# Patient Record
Sex: Female | Born: 1955
Health system: Southern US, Community
[De-identification: ages and names within clinical notes are randomized; demographics above are authoritative.]

## PROBLEM LIST (undated history)

## (undated) DIAGNOSIS — Z800 Family history of malignant neoplasm of digestive organs: Secondary | ICD-10-CM

## (undated) DIAGNOSIS — I10 Essential (primary) hypertension: Secondary | ICD-10-CM

## (undated) DIAGNOSIS — I739 Peripheral vascular disease, unspecified: Secondary | ICD-10-CM

## (undated) DIAGNOSIS — Z8049 Family history of malignant neoplasm of other genital organs: Secondary | ICD-10-CM

## (undated) DIAGNOSIS — J3089 Other allergic rhinitis: Secondary | ICD-10-CM

## (undated) DIAGNOSIS — J45909 Unspecified asthma, uncomplicated: Secondary | ICD-10-CM

## (undated) DIAGNOSIS — M1990 Unspecified osteoarthritis, unspecified site: Secondary | ICD-10-CM

## (undated) DIAGNOSIS — F419 Anxiety disorder, unspecified: Secondary | ICD-10-CM

## (undated) DIAGNOSIS — I471 Supraventricular tachycardia, unspecified: Secondary | ICD-10-CM

## (undated) DIAGNOSIS — C50919 Malignant neoplasm of unspecified site of unspecified female breast: Secondary | ICD-10-CM

## (undated) DIAGNOSIS — Z808 Family history of malignant neoplasm of other organs or systems: Secondary | ICD-10-CM

## (undated) DIAGNOSIS — M792 Neuralgia and neuritis, unspecified: Secondary | ICD-10-CM

## (undated) DIAGNOSIS — Z806 Family history of leukemia: Secondary | ICD-10-CM

## (undated) DIAGNOSIS — Z803 Family history of malignant neoplasm of breast: Secondary | ICD-10-CM

## (undated) DIAGNOSIS — Z8042 Family history of malignant neoplasm of prostate: Secondary | ICD-10-CM

## (undated) DIAGNOSIS — K219 Gastro-esophageal reflux disease without esophagitis: Secondary | ICD-10-CM

## (undated) HISTORY — DX: Malignant neoplasm of unspecified site of unspecified female breast: C50.919

## (undated) HISTORY — PX: BUNIONECTOMY: SHX129

## (undated) HISTORY — PX: COLONOSCOPY: SHX174

## (undated) HISTORY — DX: Family history of malignant neoplasm of other organs or systems: Z80.8

## (undated) HISTORY — PX: MYOMECTOMY: SHX85

## (undated) HISTORY — PX: WISDOM TOOTH EXTRACTION: SHX21

## (undated) HISTORY — PX: SVT ABLATION: EP1225

## (undated) HISTORY — DX: Family history of malignant neoplasm of breast: Z80.3

## (undated) HISTORY — DX: Family history of malignant neoplasm of other genital organs: Z80.49

## (undated) HISTORY — PX: CERVICAL FUSION: SHX112

## (undated) HISTORY — DX: Family history of malignant neoplasm of prostate: Z80.42

## (undated) HISTORY — PX: KNEE ARTHROSCOPY: SUR90

## (undated) HISTORY — PX: MOUTH SURGERY: SHX715

## (undated) HISTORY — DX: Family history of leukemia: Z80.6

## (undated) HISTORY — DX: Family history of malignant neoplasm of digestive organs: Z80.0

---

## 1999-07-18 ENCOUNTER — Other Ambulatory Visit: Admission: RE | Admit: 1999-07-18 | Discharge: 1999-07-18 | Payer: Self-pay | Admitting: Obstetrics and Gynecology

## 2000-08-14 ENCOUNTER — Other Ambulatory Visit: Admission: RE | Admit: 2000-08-14 | Discharge: 2000-08-14 | Payer: Self-pay | Admitting: Obstetrics and Gynecology

## 2001-09-10 ENCOUNTER — Other Ambulatory Visit: Admission: RE | Admit: 2001-09-10 | Discharge: 2001-09-10 | Payer: Self-pay | Admitting: Obstetrics and Gynecology

## 2002-09-23 ENCOUNTER — Other Ambulatory Visit: Admission: RE | Admit: 2002-09-23 | Discharge: 2002-09-23 | Payer: Self-pay | Admitting: Obstetrics and Gynecology

## 2003-10-06 ENCOUNTER — Other Ambulatory Visit: Admission: RE | Admit: 2003-10-06 | Discharge: 2003-10-06 | Payer: Self-pay | Admitting: Obstetrics and Gynecology

## 2004-10-11 ENCOUNTER — Other Ambulatory Visit: Admission: RE | Admit: 2004-10-11 | Discharge: 2004-10-11 | Payer: Self-pay | Admitting: Obstetrics and Gynecology

## 2013-11-07 ENCOUNTER — Encounter (HOSPITAL_BASED_OUTPATIENT_CLINIC_OR_DEPARTMENT_OTHER): Payer: Self-pay | Admitting: Emergency Medicine

## 2013-11-07 ENCOUNTER — Emergency Department (HOSPITAL_BASED_OUTPATIENT_CLINIC_OR_DEPARTMENT_OTHER)
Admission: EM | Admit: 2013-11-07 | Discharge: 2013-11-07 | Disposition: A | Discharge status: Home / Self Care | Payer: BC Managed Care – PPO | Attending: Emergency Medicine | Admitting: Emergency Medicine

## 2013-11-07 DIAGNOSIS — I10 Essential (primary) hypertension: Secondary | ICD-10-CM | POA: Insufficient documentation

## 2013-11-07 DIAGNOSIS — J3489 Other specified disorders of nose and nasal sinuses: Secondary | ICD-10-CM | POA: Diagnosis present

## 2013-11-07 DIAGNOSIS — J018 Other acute sinusitis: Secondary | ICD-10-CM | POA: Diagnosis not present

## 2013-11-07 DIAGNOSIS — IMO0002 Reserved for concepts with insufficient information to code with codable children: Secondary | ICD-10-CM | POA: Diagnosis not present

## 2013-11-07 HISTORY — DX: Essential (primary) hypertension: I10

## 2013-11-07 MED ORDER — AMOXICILLIN-POT CLAVULANATE 875-125 MG PO TABS: 1.0000 | ORAL_TABLET | Freq: Two times a day (BID) | ORAL | Status: DC

## 2013-11-07 MED ORDER — SALINE SPRAY 0.65 % NA SOLN: 1.0000 | NASAL | Status: DC | PRN

## 2013-11-07 MED ORDER — FLUTICASONE PROPIONATE 50 MCG/ACT NA SUSP: 2.0000 | Freq: Every day | NASAL | Status: DC

## 2013-11-07 NOTE — ED Provider Notes (Signed)
CSN: [id number]     Arrival date & time 11/07/13  1141 History   First MD Initiated Contact with Patient 11/07/13 1211     Chief Complaint  Patient presents with  . Sinusitis     (Consider location/radiation/quality/duration/timing/severity/associated sxs/prior Treatment) HPI  This a 58 year old female with history of hypertension who presents with nasal congestion, headache, and pressure behind her eyes. Patient reports onset of symptoms Monday. She started out with a sore throat which has gotten better. She now reports headache over her eyes and sinuses.  She reports nasal congestion. She has taken ibuprofen and Benadryl with little for leave. She's tried to see her primary physician but has been unsuccessful. She denies any fevers but does endorse hot flashes.  She denies any chest pain or shortness of breath. No known sick contacts.  Past Medical History  Diagnosis Date  . Hypertension    Past Surgical History  Procedure Laterality Date  . Cervical fusion     No family history on file. History  Substance Use Topics  . Smoking status: Never Smoker   . Smokeless tobacco: Not on file  . Alcohol Use: Yes     Comment: socially   OB History   Grav Para Term Preterm Abortions TAB SAB Ect Mult Living                 Review of Systems  Constitutional: Negative for fever.  HENT: Positive for congestion, sinus pressure and sore throat. Negative for facial swelling.   Respiratory: Negative for cough, chest tightness and shortness of breath.   Cardiovascular: Negative for chest pain.  Gastrointestinal: Negative for nausea, vomiting and abdominal pain.  Genitourinary: Negative for dysuria.  Skin: Negative for rash.  Neurological: Positive for headaches.  All other systems reviewed and are negative.     Allergies  Robaxin  Home Medications   Prior to Admission medications   Medication Sig Start Date End Date Taking? Authorizing Provider  amLODipine (NORVASC) 5 MG tablet  Take 5 mg by mouth daily.   Yes Historical Provider, MD  hydrochlorothiazide (HYDRODIURIL) 25 MG tablet Take 12.5 mg by mouth daily.   Yes Historical Provider, MD  amoxicillin-clavulanate (AUGMENTIN) 875-125 MG per tablet Take 1 tablet by mouth every 12 (twelve) hours. 11/07/13   Merryl Hacker, MD  fluticasone (FLONASE) 50 MCG/ACT nasal spray Place 2 sprays into both nostrils daily. 11/07/13   Merryl Hacker, MD  sodium chloride (OCEAN) 0.65 % SOLN nasal spray Place 1 spray into both nostrils as needed for congestion. 11/07/13   Merryl Hacker, MD   BP 122/82  Pulse 100  Temp(Src) 98.2 F (36.8 C) (Oral)  Resp 20  Ht 5\' 6"  (1.676 m)  Wt 178 lb (80.74 kg)  BMI 28.74 kg/m2  SpO2 100% Physical Exam  Nursing note and vitals reviewed. Constitutional: She is oriented to person, place, and time. She appears well-developed and well-nourished.  HENT:  Head: Normocephalic and atraumatic.  Right Ear: External ear normal.  Left Ear: External ear normal.  Nose: Nose normal.  Mouth/Throat: Oropharynx is clear and moist. No oropharyngeal exudate.  Tenderness to palpation over the frontal and maxillary sinuses  Eyes: Pupils are equal, round, and reactive to light.  Neck: Neck supple.  Cardiovascular: Normal rate, regular rhythm and normal heart sounds.   Pulmonary/Chest: Effort normal and breath sounds normal. No respiratory distress. She has no wheezes.  Abdominal: Soft. There is no tenderness.  Neurological: She is alert and oriented to person,  place, and time.  Skin: Skin is warm and dry.  Psychiatric: She has a normal mood and affect.    ED Course  Procedures (including critical care time) Labs Review Labs Reviewed - No data to display  Imaging Review No results found.   EKG Interpretation None      MDM   Final diagnoses:  Other acute sinusitis   Patient presents with sinus pressure, congestion, and headache. She is afebrile and nontoxic. She has had symptoms for less  than one week. She is not taking any nasal spray or steroids. Suspect early sinusitis. Discuss with patient supportive care with the addition of nasal saline and Flonase. I discussed the patient that at this time antibiotics are not indicated given that she is afebrile and has had such a short course of illness. Patient states that she's going out of town and is requesting antibiotics "in case it gets worse."  Again discussed the patient that and monitor not indicated at this time; however, will write patient an antibiotic prescription for her to hold for the next 3-5 days to see his symptoms began to improve with supportive care. Patient stated understanding.  After history, exam, and medical workup I feel the patient has been appropriately medically screened and is safe for discharge home. Pertinent diagnoses were discussed with the patient. Patient was given return precautions.     Merryl Hacker, MD 11/07/13 [phone]

## 2013-11-07 NOTE — ED Notes (Signed)
Pt reports congestion, pressure behind eyes x 4 days. Sts it's worse in the morning and lingers throughout the day.

## 2013-11-07 NOTE — Discharge Instructions (Signed)
Sinusitis Hold your antibiotic for 3-5 days to see if symptoms improve.  Sinusitis is redness, soreness, and swelling (inflammation) of the paranasal sinuses. Paranasal sinuses are air pockets within the bones of your face (beneath the eyes, the middle of the forehead, or above the eyes). In healthy paranasal sinuses, mucus is able to drain out, and air is able to circulate through them by way of your nose. However, when your paranasal sinuses are inflamed, mucus and air can become trapped. This can allow bacteria and other germs to grow and cause infection. Sinusitis can develop quickly and last only a short time (acute) or continue over a long period (chronic). Sinusitis that lasts for more than 12 weeks is considered chronic.  CAUSES  Causes of sinusitis include:  Allergies.  Structural abnormalities, such as displacement of the cartilage that separates your nostrils (deviated septum), which can decrease the air flow through your nose and sinuses and affect sinus drainage.  Functional abnormalities, such as when the small hairs (cilia) that line your sinuses and help remove mucus do not work properly or are not present. SYMPTOMS  Symptoms of acute and chronic sinusitis are the same. The primary symptoms are pain and pressure around the affected sinuses. Other symptoms include:  Upper toothache.  Earache.  Headache.  Bad breath.  Decreased sense of smell and taste.  A cough, which worsens when you are lying flat.  Fatigue.  Fever.  Thick drainage from your nose, which often is green and may contain pus (purulent).  Swelling and warmth over the affected sinuses. DIAGNOSIS  Your caregiver will perform a physical exam. During the exam, your caregiver may:  Look in your nose for signs of abnormal growths in your nostrils (nasal polyps).  Tap over the affected sinus to check for signs of infection.  View the inside of your sinuses (endoscopy) with a special imaging device with  a light attached (endoscope), which is inserted into your sinuses. If your caregiver suspects that you have chronic sinusitis, one or more of the following tests may be recommended:  Allergy tests.  Nasal culture--A sample of mucus is taken from your nose and sent to a lab and screened for bacteria.  Nasal cytology--A sample of mucus is taken from your nose and examined by your caregiver to determine if your sinusitis is related to an allergy. TREATMENT  Most cases of acute sinusitis are related to a viral infection and will resolve on their own within 10 days. Sometimes medicines are prescribed to help relieve symptoms (pain medicine, decongestants, nasal steroid sprays, or saline sprays).  However, for sinusitis related to a bacterial infection, your caregiver will prescribe antibiotic medicines. These are medicines that will help kill the bacteria causing the infection.  Rarely, sinusitis is caused by a fungal infection. In theses cases, your caregiver will prescribe antifungal medicine. For some cases of chronic sinusitis, surgery is needed. Generally, these are cases in which sinusitis recurs more than 3 times per year, despite other treatments. HOME CARE INSTRUCTIONS   Drink plenty of water. Water helps thin the mucus so your sinuses can drain more easily.  Use a humidifier.  Inhale steam 3 to 4 times a day (for example, sit in the bathroom with the shower running).  Apply a warm, moist washcloth to your face 3 to 4 times a day, or as directed by your caregiver.  Use saline nasal sprays to help moisten and clean your sinuses.  Take over-the-counter or prescription medicines for pain, discomfort, or  fever only as directed by your caregiver. SEEK IMMEDIATE MEDICAL CARE IF:  You have increasing pain or severe headaches.  You have nausea, vomiting, or drowsiness.  You have swelling around your face.  You have vision problems.  You have a stiff neck.  You have difficulty  breathing. MAKE SURE YOU:   Understand these instructions.  Will watch your condition.  Will get help right away if you are not doing well or get worse. Document Released: 04/17/2005 Document Revised: 07/10/2011 Document Reviewed: 05/02/2011 [facility] Patient Information 2015 [place], [place]. This information is not intended to replace advice given to you by your health care provider. Make sure you discuss any questions you have with your health care provider.

## 2015-04-09 ENCOUNTER — Ambulatory Visit (INDEPENDENT_AMBULATORY_CARE_PROVIDER_SITE_OTHER): Payer: BLUE CROSS/BLUE SHIELD | Admitting: Internal Medicine

## 2015-04-09 ENCOUNTER — Encounter: Payer: Self-pay | Admitting: Internal Medicine

## 2015-04-09 VITALS — BP 118/90 | HR 88 | Temp 98.2°F | Resp 16 | Ht 65.55 in | Wt 176.8 lb

## 2015-04-09 DIAGNOSIS — J301 Allergic rhinitis due to pollen: Secondary | ICD-10-CM

## 2015-04-09 DIAGNOSIS — J4520 Mild intermittent asthma, uncomplicated: Secondary | ICD-10-CM | POA: Diagnosis not present

## 2015-04-09 LAB — PULMONARY FUNCTION TEST

## 2015-04-09 MED ORDER — ALBUTEROL SULFATE HFA 108 (90 BASE) MCG/ACT IN AERS: 2.0000 | INHALATION_SPRAY | Freq: Four times a day (QID) | RESPIRATORY_TRACT | Status: DC | PRN

## 2015-04-09 NOTE — Assessment & Plan Note (Signed)
   Continue Proventil as needed

## 2015-04-09 NOTE — Progress Notes (Signed)
History of Present Illness: Rachael Osborn is a 59 y.o. female who is being seen today for follow-up  HPI Comments: Asthma: Since her last visit, she has had one or 2 episodes of bronchitis that was treated with prednisone and antibiotics by her primary care provider. Usual trigger is change in weather. She has not needed to go to the emergency room. She has not had pneumonia. She normally requires albuterol rarely.  Allergic rhinitis: Skin testing at an outside office was positive for dust, grass, weed, tree. She has not had any interval sinus infections but does report some nasal congestion. When she stops her daily loratadine, she does notice worsening of her symptoms. Occasionally, she will add Zyrtec but is concerned about rebound pruritus.   Assessment and Plan: Allergic rhinitis due to pollen  Continue loratadine 10 mg daily and as needed Allegra (fexofenadine)  Start Nasacort 2 sprays each nostril daily as needed  Mild intermittent asthma  Continue Proventil as needed    Return in about 1 year (around 04/08/2016).  Thank you for the opportunity to care for this patient.  Please do not hesitate to contact me with questions.  [facility] [address] [place], [place] [address] [phone]  Diagnostics: Spirometry: FEV1 110 %, FEV1/FVC  87%   Spirometry is in the normal range.   ROS: Per HPI unless specifically indicated below Review of Systems  Drug Allergies:  Allergies  Allergen Reactions  . Robaxin [Methocarbamol] Rash    Medications:  Current outpatient prescriptions:  .  albuterol (PROVENTIL HFA) 108 (90 BASE) MCG/ACT inhaler, Inhale 2 puffs into the lungs every 6 (six) hours as needed for wheezing or shortness of breath., Disp: 1 Inhaler, Rfl: 1 .  albuterol (PROVENTIL) (2.5 MG/3ML) 0.083% nebulizer solution, Take 2.5 mg by nebulization every 6 (six) hours as needed for wheezing or shortness of breath., Disp: , Rfl:  .   amLODipine (NORVASC) 5 MG tablet, Take 5 mg by mouth daily., Disp: , Rfl:  .  aspirin 81 MG tablet, Take 81 mg by mouth daily., Disp: , Rfl:  .  cetirizine (ZYRTEC) 10 MG tablet, prn, Disp: , Rfl: 6 .  DULoxetine HCl (CYMBALTA PO), Take by mouth., Disp: , Rfl:  .  estradiol (ESTRACE) 0.5 MG tablet, Take 0.5 mg by mouth daily., Disp: , Rfl:  .  Glucosamine HCl (GLUCOSAMINE PO), Take by mouth., Disp: , Rfl:  .  hydrochlorothiazide (HYDRODIURIL) 25 MG tablet, Take 12.5 mg by mouth daily., Disp: , Rfl:  .  HYDROcodone-homatropine (HYCODAN) 5-1.5 MG/5ML syrup, Take by mouth., Disp: , Rfl:  .  levonorgestrel (MIRENA, 52 MG,) 20 MCG/24HR IUD, 1 each by Intrauterine route., Disp: , Rfl:  .  loratadine (CLARITIN) 10 MG tablet, Take 10 mg by mouth daily., Disp: , Rfl:  .  multivitamin-iron-minerals-folic acid (CENTRUM) chewable tablet, Chew by mouth., Disp: , Rfl:  .  sodium chloride (OCEAN) 0.65 % SOLN nasal spray, Place 1 spray into both nostrils as needed for congestion., Disp: 30 mL, Rfl: 0 .  triamcinolone (NASACORT ALLERGY 24HR) 55 MCG/ACT AERO nasal inhaler, Place 2 sprays into the nose once as needed. , Disp: , Rfl:  .  amoxicillin-clavulanate (AUGMENTIN) 875-125 MG per tablet, Take 1 tablet by mouth every 12 (twelve) hours. (Patient not taking: Reported on 04/09/2015), Disp: 14 tablet, Rfl: 0  Physical Exam: BP 118/90 mmHg  Pulse 88  Temp(Src) 98.2 F (36.8 C) (Oral)  Resp 16  Ht 5' 5.55" (  1.665 m)  Wt 176 lb 12.9 oz (80.2 kg)  BMI 28.93 kg/m2  Physical Exam  Constitutional: She appears well-developed and well-nourished. No distress.  HENT:  Right Ear: External ear normal.  Left Ear: External ear normal.  Nose: Nose normal.  Mouth/Throat: Oropharynx is clear and moist.  Eyes: Conjunctivae are normal. Right eye exhibits no discharge. Left eye exhibits no discharge.  Cardiovascular: Normal rate, regular rhythm and normal heart sounds.   No murmur heard. Pulmonary/Chest: Effort normal  and breath sounds normal. No respiratory distress. She has no wheezes. She has no rales.  Abdominal: Soft. Bowel sounds are normal.  Musculoskeletal: She exhibits no edema.  Lymphadenopathy:    She has no cervical adenopathy.  Neurological: She is alert.  Skin: No rash noted.  Vitals reviewed.

## 2015-04-09 NOTE — Assessment & Plan Note (Signed)
   Continue loratadine 10 mg daily and as needed Allegra (fexofenadine)  Start Nasacort 2 sprays each nostril daily as needed

## 2015-04-09 NOTE — Patient Instructions (Signed)
Allergic rhinitis due to pollen  Continue loratadine 10 mg daily and as needed Allegra (fexofenadine)  Start Nasacort 2 sprays each nostril daily as needed  Mild intermittent asthma  Continue Proventil as needed

## 2015-04-13 ENCOUNTER — Other Ambulatory Visit: Payer: Self-pay | Admitting: Allergy

## 2015-04-13 NOTE — Telephone Encounter (Signed)
Got fax from [facility] for refill on amlodipine besylate 5mg . Faxed back we didn't prescribe.

## 2015-04-14 ENCOUNTER — Encounter: Payer: Self-pay | Admitting: Internal Medicine

## 2015-12-14 ENCOUNTER — Ambulatory Visit (INDEPENDENT_AMBULATORY_CARE_PROVIDER_SITE_OTHER): Payer: BLUE CROSS/BLUE SHIELD | Admitting: Pediatrics

## 2015-12-14 ENCOUNTER — Encounter: Payer: Self-pay | Admitting: Pediatrics

## 2015-12-14 VITALS — BP 126/68 | HR 88 | Temp 98.6°F | Resp 20 | Ht 65.35 in | Wt 179.0 lb

## 2015-12-14 DIAGNOSIS — J3089 Other allergic rhinitis: Secondary | ICD-10-CM

## 2015-12-14 DIAGNOSIS — I10 Essential (primary) hypertension: Secondary | ICD-10-CM | POA: Insufficient documentation

## 2015-12-14 DIAGNOSIS — L253 Unspecified contact dermatitis due to other chemical products: Secondary | ICD-10-CM | POA: Insufficient documentation

## 2015-12-14 DIAGNOSIS — J4530 Mild persistent asthma, uncomplicated: Secondary | ICD-10-CM | POA: Diagnosis not present

## 2015-12-14 MED ORDER — ALBUTEROL SULFATE HFA 108 (90 BASE) MCG/ACT IN AERS: 2.0000 | INHALATION_SPRAY | RESPIRATORY_TRACT | 1 refills | Status: DC | PRN

## 2015-12-14 MED ORDER — ALBUTEROL SULFATE (2.5 MG/3ML) 0.083% IN NEBU: 2.5000 mg | INHALATION_SOLUTION | RESPIRATORY_TRACT | 1 refills | Status: DC | PRN

## 2015-12-14 MED ORDER — MONTELUKAST SODIUM 10 MG PO TABS: 10.0000 mg | ORAL_TABLET | Freq: Every day | ORAL | 5 refills | Status: DC

## 2015-12-14 MED ORDER — TRIAMCINOLONE ACETONIDE 55 MCG/ACT NA AERO: INHALATION_SPRAY | NASAL | 5 refills | Status: DC

## 2015-12-14 MED ORDER — TRIAMCINOLONE ACETONIDE 0.1 % EX OINT: TOPICAL_OINTMENT | CUTANEOUS | 3 refills | Status: DC

## 2015-12-14 NOTE — Progress Notes (Signed)
[place] [id number] Dept: [phone]  FOLLOW UP NOTE  Patient ID: Rachael Osborn, female    DOB: Sep 02, 1955  Age: 60 y.o. MRN: [id number] Date of Office Visit: 12/14/2015  Assessment  Chief Complaint: Breathing Problem (more through her nasal passage. pt. had deviated septum repair back in the mid to late 90's); Nasal Congestion (sore throat); Rash (breaking out more around her neck at night.); and Cough (dry going on x's 1 week.)  HPI Rachael Osborn presents for follow-up of allergic rhinitis , asthma and a rash. Her asthma was well controlled until the past 2 weeks when she has been having more coughing. She used to be on allergy injections in the 1990s and was allergic to dust mites and some grass pollen During the past 2 weeks, she has had an itchy rash around her neck which improved with the use of hydrocortisone 1% over-the-counter.  Current medications Proventil 2 puffs every 4 hours if needed and loratadine 10 mg once a day. Her other medications are outlined in the chart.   Drug Allergies:  Allergies  Allergen Reactions  . Zyrtec [Cetirizine] Itching  . Robaxin [Methocarbamol] Rash    Physical Exam: BP 126/68 (BP Location: Right Arm, Patient Position: Sitting, Cuff Size: Normal)   Pulse 88   Temp 98.6 F (37 C) (Oral)   Resp 20   Ht 5' 5.35" (1.66 m)   Wt 179 lb 0.2 oz (81.2 kg)   BMI 29.47 kg/m    Physical Exam  Constitutional: She is oriented to person, place, and time. She appears well-developed and well-nourished.  HENT:  Eyes normal. Ears normal. Nose moderate swelling of nasal turbinates with clear nasal discharge. Pharynx normal.  Neck: Neck supple.  Cardiovascular:  S1 and S2 normal no murmurs  Pulmonary/Chest:  Clear to percussion and auscultation  Lymphadenopathy:    She has no cervical adenopathy.  Neurological: She is alert and oriented to person, place, and time.  Psychiatric:  Clear  Vitals reviewed.   Diagnostics:  FVC 2.86 L FEV1 2.47 L. Predicted FVC 2.74 L predicted FEV1 2.15 L-the spirometry is in the normal range  Assessment and Plan: 1. Mild persistent asthma, uncomplicated   2. Other allergic rhinitis   3. Contact dermatitis due to chemicals   4. Essential hypertension     Meds ordered this encounter  Medications  . montelukast (SINGULAIR) 10 MG tablet    Sig: Take 1 tablet (10 mg total) by mouth at bedtime.    Dispense:  30 tablet    Refill:  5    For cough or wheeze.  [name] albuterol (PROVENTIL HFA) 108 (90 Base) MCG/ACT inhaler    Sig: Inhale 2 puffs into the lungs every 4 (four) hours as needed for wheezing or shortness of breath.    Dispense:  1 Inhaler    Refill:  1  . albuterol (PROVENTIL) (2.5 MG/3ML) 0.083% nebulizer solution    Sig: Take 3 mLs (2.5 mg total) by nebulization every 4 (four) hours as needed for wheezing or shortness of breath.    Dispense:  75 mL    Refill:  1  . triamcinolone ointment (KENALOG) 0.1 %    Sig: Apply twice daily to red itchy areas    Dispense:  45 g    Refill:  3    Below face  . triamcinolone (NASACORT ALLERGY 24HR) 55 MCG/ACT AERO nasal inhaler    Sig: TWO SPRAYS EACH NOSTRIL ONCE A DAY FOR NASAL CONGESTION  OR DRAINAGE.    Dispense:  16.9 mL    Refill:  5    Patient Instructions  Loratadine 10 mg once a day for runny nose or itchy eyes Nasacort AQ 2 sprays per nostril once a day for stuffy nose Montelukast  10 mg once a day for coughing or wheezing Proventil 2 puffs every 4 hours if needed for wheezing or coughing spells or instead albuterol 0.083% one unit dose every 4 hours if needed Triamcinolone 0.1% ointment twice a day if needed to red itchy areas below the face Call me if you are not doing well on this treatment plan   Return in about 1 year (around 12/13/2016).    Thank you for the opportunity to care for this patient.  Please do not hesitate to contact me with questions.  Penne Lash, M.D.  [facility] [address] [place], [place] [address] [phone]

## 2015-12-14 NOTE — Patient Instructions (Addendum)
Loratadine 10 mg once a day for runny nose or itchy eyes Nasacort AQ 2 sprays per nostril once a day for stuffy nose Montelukast  10 mg once a day for coughing or wheezing Proventil 2 puffs every 4 hours if needed for wheezing or coughing spells or instead albuterol 0.083% one unit dose every 4 hours if needed Triamcinolone 0.1% ointment twice a day if needed to red itchy areas below the face Call me if you are not doing well on this treatment plan

## 2016-04-28 ENCOUNTER — Telehealth: Payer: Self-pay | Admitting: *Deleted

## 2016-04-28 NOTE — Telephone Encounter (Signed)
Pt is wondering if we can refile to the insurance companys.

## 2016-05-02 NOTE — Telephone Encounter (Signed)
Adjusted due to [facility] requirement

## 2016-05-08 ENCOUNTER — Other Ambulatory Visit: Payer: Self-pay | Admitting: Obstetrics and Gynecology

## 2016-05-08 NOTE — Patient Instructions (Signed)
Your procedure is scheduled on:  Monday, Jan. 15, 2018  Enter through the [employer] [facility] at:  11:15 AM  Pick up the phone at the desk and dial [phone].  Call this number if you have problems the morning of surgery: [phone].  Remember: Do NOT eat food:  After Midnight Sunday  Do NOT drink clear liquids after:  8:30 AM day of surgery  Take these medicines the morning of surgery with a SIP OF WATER:  Amlodipine, Duloxetine, Hydrochlorothiazide  Bring Asthma Inhaler day of surgery  Stop ALL herbal medications and turmeric at this time  Do NOT smoke the day of surgery.  Do NOT wear jewelry (body piercing), metal hair clips/bobby pins, make-up, or nail polish. Do NOT wear lotions, powders, or perfumes.  You may wear deodorant. Do NOT shave for 48 hours prior to surgery. Do NOT bring valuables to the hospital. Contacts, dentures, or bridgework may not be worn into surgery.  Have a responsible adult drive you home and stay with you for 24 hours after your procedure  Bring a copy of your healthcare power of attorney and living will documents.

## 2016-05-09 ENCOUNTER — Encounter (HOSPITAL_COMMUNITY)
Admission: RE | Admit: 2016-05-09 | Discharge: 2016-05-09 | Disposition: A | Discharge status: Home / Self Care | Payer: BLUE CROSS/BLUE SHIELD | Source: Ambulatory Visit | Attending: Obstetrics and Gynecology | Admitting: Obstetrics and Gynecology

## 2016-05-09 ENCOUNTER — Encounter (HOSPITAL_COMMUNITY): Payer: Self-pay

## 2016-05-09 DIAGNOSIS — Z01810 Encounter for preprocedural cardiovascular examination: Secondary | ICD-10-CM | POA: Diagnosis present

## 2016-05-09 DIAGNOSIS — Z01812 Encounter for preprocedural laboratory examination: Secondary | ICD-10-CM | POA: Diagnosis present

## 2016-05-09 HISTORY — DX: Other allergic rhinitis: J30.89

## 2016-05-09 HISTORY — DX: Unspecified osteoarthritis, unspecified site: M19.90

## 2016-05-09 HISTORY — DX: Neuralgia and neuritis, unspecified: M79.2

## 2016-05-09 HISTORY — DX: Unspecified asthma, uncomplicated: J45.909

## 2016-05-09 LAB — BASIC METABOLIC PANEL
Anion gap: 6 (ref 5–15)
BUN: 15 mg/dL (ref 6–20)
CHLORIDE: 103 mmol/L (ref 101–111)
CO2: 30 mmol/L (ref 22–32)
CREATININE: 0.67 mg/dL (ref 0.44–1.00)
Calcium: 9.4 mg/dL (ref 8.9–10.3)
GFR calc Af Amer: >60 mL/min (ref >60)
GFR calc non Af Amer: >60 mL/min (ref >60)
GLUCOSE: 86 mg/dL (ref 65–99)
POTASSIUM: 3.8 mmol/L (ref 3.5–5.1)
SODIUM: 139 mmol/L (ref 135–145)

## 2016-05-09 LAB — CBC
HEMATOCRIT: 37.9 % (ref 36.0–46.0)
HEMOGLOBIN: 13 g/dL (ref 12.0–15.0)
MCH: 29.6 pg (ref 26.0–34.0)
MCHC: 34.3 g/dL (ref 30.0–36.0)
MCV: 86.3 fL (ref 78.0–100.0)
Platelets: 263 10*3/uL (ref 150–400)
RBC: 4.39 MIL/uL (ref 3.87–5.11)
RDW: 13.9 % (ref 11.5–15.5)
WBC: 5.3 10*3/uL (ref 4.0–10.5)

## 2016-05-09 NOTE — Pre-Procedure Instructions (Signed)
Dr. Hatchett reviewed EKG no new orders received at this time. 

## 2016-05-15 ENCOUNTER — Ambulatory Visit (HOSPITAL_COMMUNITY): Payer: BLUE CROSS/BLUE SHIELD | Admitting: Anesthesiology

## 2016-05-15 ENCOUNTER — Encounter (HOSPITAL_COMMUNITY): Payer: Self-pay | Admitting: *Deleted

## 2016-05-15 ENCOUNTER — Encounter (HOSPITAL_COMMUNITY)
Admission: RE | Disposition: A | Discharge status: Home / Self Care | Payer: Self-pay | Source: Ambulatory Visit | Attending: Obstetrics and Gynecology

## 2016-05-15 ENCOUNTER — Ambulatory Visit (HOSPITAL_COMMUNITY)
Admission: RE | Admit: 2016-05-15 | Discharge: 2016-05-15 | Disposition: A | Discharge status: Home / Self Care | Payer: BLUE CROSS/BLUE SHIELD | Source: Ambulatory Visit | Attending: Obstetrics and Gynecology | Admitting: Obstetrics and Gynecology

## 2016-05-15 DIAGNOSIS — Z4589 Encounter for adjustment and management of other implanted devices: Secondary | ICD-10-CM | POA: Diagnosis present

## 2016-05-15 DIAGNOSIS — Z7982 Long term (current) use of aspirin: Secondary | ICD-10-CM | POA: Insufficient documentation

## 2016-05-15 DIAGNOSIS — M1990 Unspecified osteoarthritis, unspecified site: Secondary | ICD-10-CM | POA: Diagnosis not present

## 2016-05-15 DIAGNOSIS — I10 Essential (primary) hypertension: Secondary | ICD-10-CM | POA: Diagnosis not present

## 2016-05-15 DIAGNOSIS — Z79899 Other long term (current) drug therapy: Secondary | ICD-10-CM | POA: Insufficient documentation

## 2016-05-15 HISTORY — PX: HYSTEROSCOPY: SHX211

## 2016-05-15 SURGERY — EXAM UNDER ANESTHESIA
Anesthesia: General

## 2016-05-15 MED ORDER — MIDAZOLAM HCL 2 MG/2ML IJ SOLN
INTRAMUSCULAR | Status: AC
  Filled 2016-05-15: qty 2

## 2016-05-15 MED ORDER — MIDAZOLAM HCL 5 MG/5ML IJ SOLN
INTRAMUSCULAR | Status: DC | PRN
  Administered 2016-05-15: 2 mg via INTRAVENOUS

## 2016-05-15 MED ORDER — LIDOCAINE HCL 1 % IJ SOLN
INTRAMUSCULAR | Status: AC
  Filled 2016-05-15: qty 20

## 2016-05-15 MED ORDER — DEXAMETHASONE SODIUM PHOSPHATE 4 MG/ML IJ SOLN
INTRAMUSCULAR | Status: AC
  Filled 2016-05-15: qty 1

## 2016-05-15 MED ORDER — SCOPOLAMINE 1 MG/3DAYS TD PT72
MEDICATED_PATCH | TRANSDERMAL | Status: AC
  Administered 2016-05-15: 1.5 mg
  Filled 2016-05-15: qty 1

## 2016-05-15 MED ORDER — LACTATED RINGERS IV SOLN: INTRAVENOUS | Status: DC

## 2016-05-15 MED ORDER — ONDANSETRON HCL 4 MG/2ML IJ SOLN
INTRAMUSCULAR | Status: AC
  Filled 2016-05-15: qty 2

## 2016-05-15 MED ORDER — LIDOCAINE HCL 1 % IJ SOLN
INTRAMUSCULAR | Status: DC | PRN
  Administered 2016-05-15: 10 mL

## 2016-05-15 MED ORDER — KETOROLAC TROMETHAMINE 30 MG/ML IJ SOLN
INTRAMUSCULAR | Status: DC | PRN
Start: 2016-05-15 — End: 2016-05-15
  Administered 2016-05-15: 30 mg via INTRAVENOUS

## 2016-05-15 MED ORDER — FENTANYL CITRATE (PF) 100 MCG/2ML IJ SOLN
INTRAMUSCULAR | Status: DC | PRN
  Administered 2016-05-15: 50 ug via INTRAVENOUS

## 2016-05-15 MED ORDER — DEXAMETHASONE SODIUM PHOSPHATE 10 MG/ML IJ SOLN
INTRAMUSCULAR | Status: DC | PRN
  Administered 2016-05-15: 4 mg via INTRAVENOUS

## 2016-05-15 MED ORDER — LIDOCAINE HCL (CARDIAC) 20 MG/ML IV SOLN
INTRAVENOUS | Status: DC | PRN
  Administered 2016-05-15: 100 mg via INTRAVENOUS

## 2016-05-15 MED ORDER — SUCCINYLCHOLINE CHLORIDE 200 MG/10ML IV SOSY
PREFILLED_SYRINGE | INTRAVENOUS | Status: AC
  Filled 2016-05-15: qty 10

## 2016-05-15 MED ORDER — CEFAZOLIN SODIUM-DEXTROSE 2-4 GM/100ML-% IV SOLN
INTRAVENOUS | Status: AC
  Filled 2016-05-15: qty 100

## 2016-05-15 MED ORDER — LIDOCAINE HCL (CARDIAC) 20 MG/ML IV SOLN
INTRAVENOUS | Status: AC
  Filled 2016-05-15: qty 5

## 2016-05-15 MED ORDER — FENTANYL CITRATE (PF) 100 MCG/2ML IJ SOLN
INTRAMUSCULAR | Status: AC
  Filled 2016-05-15: qty 2

## 2016-05-15 MED ORDER — CEFAZOLIN SODIUM-DEXTROSE 2-3 GM-% IV SOLR
INTRAVENOUS | Status: DC | PRN
  Administered 2016-05-15: 2 g via INTRAVENOUS

## 2016-05-15 MED ORDER — PROPOFOL 10 MG/ML IV BOLUS
INTRAVENOUS | Status: DC | PRN
  Administered 2016-05-15: 300 mg via INTRAVENOUS

## 2016-05-15 MED ORDER — ONDANSETRON HCL 4 MG/2ML IJ SOLN
INTRAMUSCULAR | Status: DC | PRN
  Administered 2016-05-15: 4 mg via INTRAVENOUS

## 2016-05-15 MED ORDER — PROPOFOL 10 MG/ML IV BOLUS
INTRAVENOUS | Status: AC
  Filled 2016-05-15: qty 40

## 2016-05-15 MED ORDER — LACTATED RINGERS IV SOLN
INTRAVENOUS | Status: DC
  Administered 2016-05-15: 12:00:00 via INTRAVENOUS

## 2016-05-15 SURGICAL SUPPLY — 19 items
BIPOLAR CUTTING LOOP 21FR (ELECTRODE)
CANISTER SUCT 3000ML (MISCELLANEOUS) ×3 IMPLANT
CATH ROBINSON RED A/P 16FR (CATHETERS) ×3 IMPLANT
CLOTH BEACON ORANGE TIMEOUT ST (SAFETY) ×3 IMPLANT
CONTAINER PREFILL 10% NBF 60ML (FORM) ×6 IMPLANT
COVER BACK TABLE 60X90IN (DRAPES) ×3 IMPLANT
ELECT REM PT RETURN 9FT ADLT (ELECTROSURGICAL) ×3
ELECTRODE REM PT RTRN 9FT ADLT (ELECTROSURGICAL) ×1 IMPLANT
GLOVE BIOGEL PI IND STRL 7.0 (GLOVE) ×1 IMPLANT
GLOVE BIOGEL PI INDICATOR 7.0 (GLOVE) ×2
GLOVE ECLIPSE 7.0 STRL STRAW (GLOVE) ×6 IMPLANT
GOWN STRL REUS W/TWL LRG LVL3 (GOWN DISPOSABLE) ×9 IMPLANT
LOOP CUTTING BIPOLAR 21FR (ELECTRODE) IMPLANT
PACK VAGINAL MINOR WOMEN LF (CUSTOM PROCEDURE TRAY) ×3 IMPLANT
PAD OB MATERNITY 4.3X12.25 (PERSONAL CARE ITEMS) ×3 IMPLANT
TOWEL OR 17X24 6PK STRL BLUE (TOWEL DISPOSABLE) ×6 IMPLANT
TUBING AQUILEX INFLOW (TUBING) ×3 IMPLANT
TUBING AQUILEX OUTFLOW (TUBING) ×3 IMPLANT
WATER STERILE IRR 1000ML POUR (IV SOLUTION) ×3 IMPLANT

## 2016-05-15 NOTE — Transfer of Care (Signed)
Immediate Anesthesia Transfer of Care Note  Patient: Rachael Osborn  Procedure(s) Performed: Procedure(s): EXAM UNDER ANESTHESIA (N/A) HYSTEROSCOPY (N/A)  Patient Location: PACU  Anesthesia Type:General  Level of Consciousness: awake, alert  and oriented  Airway & Oxygen Therapy: Patient Spontanous Breathing and Patient connected to nasal cannula oxygen  Post-op Assessment: Report given to RN and Post -op Vital signs reviewed and stable  Post vital signs: Reviewed and stable  Last Vitals:  Vitals:   05/15/16 1127 05/15/16 1326  BP: 130/90 125/86  Pulse: 90 (!) 103  Resp: 20 16  Temp: 36.7 C 36.9 C    Last Pain:  Vitals:   05/15/16 1127  TempSrc: Oral      Patients Stated Pain Goal: 3 ([id number])  Complications: No apparent anesthesia complications

## 2016-05-15 NOTE — Anesthesia Preprocedure Evaluation (Signed)
Anesthesia Evaluation  Patient identified by MRN, date of birth, ID band Patient awake    Reviewed: Allergy & Precautions, NPO status , Patient's Chart, lab work & pertinent test results  Airway Mallampati: II  TM Distance: >3 FB Neck ROM: Full    Dental  (+) Teeth Intact, Dental Advisory Given   Pulmonary asthma ,    Pulmonary exam normal breath sounds clear to auscultation       Cardiovascular hypertension, Pt. on medications Normal cardiovascular exam Rhythm:Regular Rate:Normal     Neuro/Psych negative neurological ROS  negative psych ROS   GI/Hepatic negative GI ROS, Neg liver ROS,   Endo/Other  negative endocrine ROS  Renal/GU negative Renal ROS     Musculoskeletal  (+) Arthritis , Osteoarthritis,    Abdominal   Peds  Hematology negative hematology ROS (+)   Anesthesia Other Findings Day of surgery medications reviewed with the patient.  Reproductive/Obstetrics                             Anesthesia Physical Anesthesia Plan  ASA: II  Anesthesia Plan: General   Post-op Pain Management:    Induction: Intravenous  Airway Management Planned: Oral ETT  Additional Equipment:   Intra-op Plan:   Post-operative Plan: Extubation in OR  Informed Consent: I have reviewed the patients History and Physical, chart, labs and discussed the procedure including the risks, benefits and alternatives for the proposed anesthesia with the patient or authorized representative who has indicated his/her understanding and acceptance.   Dental advisory given  Plan Discussed with: CRNA  Anesthesia Plan Comments: (Risks/benefits of general anesthesia discussed with patient including risk of damage to teeth, lips, gum, and tongue, nausea/vomiting, allergic reactions to medications, and the possibility of heart attack, stroke and death.  All patient questions answered.  Patient wishes to proceed.)         Anesthesia Quick Evaluation

## 2016-05-15 NOTE — Anesthesia Postprocedure Evaluation (Signed)
Anesthesia Post Note  Patient: KESHAUN NEWNHAM  Procedure(s) Performed: Procedure(s) (LRB): EXAM UNDER ANESTHESIA (N/A) HYSTEROSCOPY (N/A)  Patient location during evaluation: PACU Anesthesia Type: General Level of consciousness: awake and alert Pain management: pain level controlled Vital Signs Assessment: post-procedure vital signs reviewed and stable Respiratory status: spontaneous breathing, nonlabored ventilation, respiratory function stable and patient connected to nasal cannula oxygen Cardiovascular status: blood pressure returned to baseline and stable Postop Assessment: no signs of nausea or vomiting Anesthetic complications: no        Last Vitals:  Vitals:   05/15/16 1415 05/15/16 1430  BP: 118/79 116/80  Pulse: 86 93  Resp: 15 17  Temp:  36.4 C    Last Pain:  Vitals:   05/15/16 1440  TempSrc:   PainSc: 0-No pain   Pain Goal: Patients Stated Pain Goal: 3 (05/15/16 1430)               Catalina Gravel
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
## 2016-05-15 NOTE — Anesthesia Procedure Notes (Signed)
Procedure Name: Intubation Date/Time: 05/15/2016 12:33 PM Performed by: Johnathan Tortorelli, Sheron Nightingale Pre-anesthesia Checklist: Patient identified, Timeout performed, Emergency Drugs available, Suction available and Patient being monitored Patient Re-evaluated:Patient Re-evaluated prior to inductionOxygen Delivery Method: Circle system utilized Preoxygenation: Pre-oxygenation with 100% oxygen Intubation Type: IV induction Laryngoscope Size: Mac and 3 Grade View: Grade II Tube type: Oral Tube size: 7.0 mm Number of attempts: 1 Placement Confirmation: ETT inserted through vocal cords under direct vision,  breath sounds checked- equal and bilateral and positive ETCO2 Secured at: 21 cm Dental Injury: Teeth and Oropharynx as per pre-operative assessment

## 2016-05-15 NOTE — Discharge Instructions (Signed)
DISCHARGE INSTRUCTIONS: HYSTEROSCOPY / ENDOMETRIAL ABLATION The following instructions have been prepared to help you care for yourself upon your return home.  May Remove Scop patch on or before  May take Ibuprofen after  May take stool softner while taking narcotic pain medication to prevent constipation.  Drink plenty of water.  Personal hygiene:  Use sanitary pads for vaginal drainage, not tampons.  Shower the day after your procedure.  NO tub baths, pools or Jacuzzis for 2-3 weeks.  Wipe front to back after using the bathroom.  Activity and limitations:  Do NOT drive or operate any equipment for 24 hours. The effects of anesthesia are still present and drowsiness may result.  Do NOT rest in bed all day.  Walking is encouraged.  Walk up and down stairs slowly.  You may resume your normal activity in one to two days or as indicated by your physician. Sexual activity: NO intercourse for at least 2 weeks after the procedure, or as indicated by your Doctor.  Diet: Eat a light meal as desired this evening. You may resume your usual diet tomorrow.  Return to Work: You may resume your work activities in one to two days or as indicated by your Doctor.  What to expect after your surgery: Expect to have vaginal bleeding/discharge for 2-3 days and spotting for up to 10 days. It is not unusual to have soreness for up to 1-2 weeks. You may have a slight burning sensation when you urinate for the first day. Mild cramps may continue for a couple of days. You may have a regular period in 2-6 weeks.  Call your doctor for any of the following:  Excessive vaginal bleeding or clotting, saturating and changing one pad every hour.  Inability to urinate 6 hours after discharge from hospital.  Pain not relieved by pain medication.  Fever of 100.4 F or greater.  Unusual vaginal discharge or odor.  Return to office _________________Call for an appointment ___________________ Patient's signature:  ______________________ Nurse's signature ________________________  Post Anesthesia Care Unit [phone]  Post Anesthesia Home Care Instructions  Activity: Get plenty of rest for the remainder of the day. A responsible adult should stay with you for 24 hours following the procedure.  For the next 24 hours, DO NOT: -Drive a car -Operate machinery -Drink alcoholic beverages -Take any medication unless instructed by your physician -Make any legal decisions or sign important papers.  Meals: Start with liquid foods such as gelatin or soup. Progress to regular foods as tolerated. Avoid greasy, spicy, heavy foods. If nausea and/or vomiting occur, drink only clear liquids until the nausea and/or vomiting subsides. Call your physician if vomiting continues.  Special Instructions/Symptoms: Your throat may feel dry or sore from the anesthesia or the breathing tube placed in your throat during surgery. If this causes discomfort, gargle with warm salt water. The discomfort should disappear within 24 hours.  If you had a scopolamine patch placed behind your ear for the management of post- operative nausea and/or vomiting:  1. The medication in the patch is effective for 72 hours, after which it should be removed.  Wrap patch in a tissue and discard in the trash. Wash hands thoroughly with soap and water. 2. You may remove the patch earlier than 72 hours if you experience unpleasant side effects which may include dry mouth, dizziness or visual disturbances. 3. Avoid touching the patch. Wash your hands with soap and water after contact with the patch.    

## 2016-05-15 NOTE — Anesthesia Procedure Notes (Signed)
Date/Time: 05/15/2016 12:53 PM Performed by: [employer], Sheron Nightingale

## 2016-05-16 ENCOUNTER — Encounter (HOSPITAL_COMMUNITY): Payer: Self-pay | Admitting: Obstetrics and Gynecology

## 2016-05-16 NOTE — Op Note (Signed)
Rachael Osborn, Rachael Osborn                ACCOUNT NO.:  [id number]  MEDICAL RECORD NO.:  [id number]  LOCATION:                                 FACILITY:  PHYSICIAN:  Freda Munro, M.D.    DATE OF BIRTH:  1955/07/22  DATE OF PROCEDURE:  05/15/2016 DATE OF DISCHARGE:                              OPERATIVE REPORT   PREOPERATIVE DIAGNOSES: 1. Retained intrauterine pregnancy. 2. Suspected embedded uterine intrauterine device.  POSTOPERATIVE DIAGNOSIS:  Embedded intrauterine device.  SURGEON:  Freda Munro, M.D.  ANESTHESIA:  General and local.  ANTIBIOTICS:  Ancef 2 g.  DRAINS:  Red rubber catheter bladder.  SPECIMENS:  None.  ESTIMATED BLOOD LOSS:  Minimal.  COMPLICATIONS:  None.  FLUID DEFICIT:  100 mL.  PROCEDURE IN DETAIL:  The patient was taken to the operating room, where she was placed in the dorsal supine position.  A general anesthetic was administered without difficulty.  She was then placed in dorsal lithotomy position.  She was prepped and draped in usual fashion for this procedure.  A sterile speculum was placed in the vagina.  10 mL of 1% lidocaine was used for paracervical block.  A single-tooth tenaculum was applied to the anterior cervical lip.  The cervix was then serially dilated to a 25-French.  The hysteroscope was advanced through the endocervical canal.  In the endocervical canal, the IUD string was identified.  At this point, the hysteroscope was removed and packing forceps were used to grasp the IUD string.  An attempt to remove the IUD was unsuccessful.  It appeared that the IUD was not moving at all with traction on the IUD string.  Further stenting of the traction resulted in the IUD string, breaking off the IUD.  At this point, the hysteroscope was placed into the uterine cavity and it was noted that both arms were embedded in the wall of the uterine cavity.  A grasper was placed through the hysteroscope and each wing of the IUD was tethered out  from the wall of the uterus.  Once this was accomplished, the IUD was removed.  The patient was taken to the recovery room in stable condition.  Instrument and lap counts correct x2.  The patient will be discharged to home.  She will be sent home with Advil to take p.r.n.  She will follow up in the office in a month.    ______________________________ Freda Munro, M.D.   ______________________________ Freda Munro, M.D.    MA/MEDQ  D:  05/15/2016  T:  05/16/2016  Job:  [id number]

## 2016-05-16 NOTE — Addendum Note (Signed)
Addendum  created 05/16/16 1049 by Laverle Hobby, CRNA   Charge Capture section accepted

## 2016-05-26 ENCOUNTER — Telehealth: Payer: Self-pay | Admitting: *Deleted

## 2016-05-26 NOTE — Telephone Encounter (Signed)
Pt states she is having itching and watering in her eyes. Pt is wondering if we can call something in for her. Pt states that her work place is located in an old building and she thinks its messing with her allergies.   Pharmacy is [facility] on [address] in [place]

## 2016-10-26 ENCOUNTER — Ambulatory Visit (INDEPENDENT_AMBULATORY_CARE_PROVIDER_SITE_OTHER): Payer: BLUE CROSS/BLUE SHIELD | Admitting: Allergy and Immunology

## 2016-10-26 ENCOUNTER — Encounter: Payer: Self-pay | Admitting: Allergy and Immunology

## 2016-10-26 VITALS — BP 100/60 | HR 100 | Temp 98.3°F | Resp 20

## 2016-10-26 DIAGNOSIS — J45901 Unspecified asthma with (acute) exacerbation: Secondary | ICD-10-CM | POA: Insufficient documentation

## 2016-10-26 DIAGNOSIS — J3089 Other allergic rhinitis: Secondary | ICD-10-CM | POA: Diagnosis not present

## 2016-10-26 DIAGNOSIS — L505 Cholinergic urticaria: Secondary | ICD-10-CM | POA: Diagnosis not present

## 2016-10-26 DIAGNOSIS — J45909 Unspecified asthma, uncomplicated: Secondary | ICD-10-CM | POA: Insufficient documentation

## 2016-10-26 MED ORDER — MOMETASONE FUROATE 50 MCG/ACT NA SUSP: 2.0000 | Freq: Every day | NASAL | 5 refills | Status: DC | PRN

## 2016-10-26 MED ORDER — ALBUTEROL SULFATE HFA 108 (90 BASE) MCG/ACT IN AERS: 2.0000 | INHALATION_SPRAY | Freq: Four times a day (QID) | RESPIRATORY_TRACT | 2 refills | Status: DC | PRN

## 2016-10-26 MED ORDER — PREDNISONE 1 MG PO TABS: 10.0000 mg | ORAL_TABLET | Freq: Every day | ORAL | Status: DC

## 2016-10-26 MED ORDER — FLUTICASONE PROPIONATE HFA 110 MCG/ACT IN AERO: 2.0000 | INHALATION_SPRAY | Freq: Two times a day (BID) | RESPIRATORY_TRACT | 5 refills | Status: DC

## 2016-10-26 NOTE — Assessment & Plan Note (Signed)
   Prednisone has been provided, 20 mg x 4 days, 10 mg x1 day, then stop.  A prescription has been provided for Flovent 110 g, 2 inhalations twice a day. To maximize pulmonary deposition, a spacer has been provided along with instructions for its proper administration with an HFA inhaler.  Continue albuterol HFA, 1-2 inhalations every 4-6 hours as needed.  The patient has been asked to contact me if her symptoms persist or progress. Otherwise, she may return for follow up in 4 months. 

## 2016-10-26 NOTE — Patient Instructions (Addendum)
Asthma with acute exacerbation  Prednisone has been provided, 20 mg x 4 days, 10 mg x1 day, then stop.  A prescription has been provided for Flovent 110 g, 2 inhalations twice a day. To maximize pulmonary deposition, a spacer has been provided along with instructions for its proper administration with an HFA inhaler.  Continue albuterol HFA, 1-2 inhalations every 4-6 hours as needed.  The patient has been asked to contact me if her symptoms persist or progress. Otherwise, she may return for follow up in 4 months.  Other allergic rhinitis  A prescription has been provided for Nasonex nasal spray, one spray per nostril 1-2 times daily as needed. Proper nasal spray technique has been discussed and demonstrated.  Nasal saline lavage (NeilMed) has been recommended as needed and prior to medicated nasal sprays along with instructions for proper administration.  For thick post nasal drainage, add guaifenesin [phone] mg (Mucinex)  twice daily as needed with adequate hydration as discussed.  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 Antihistamines are helpful for cholinergic urticaria. The response to first and second generation antihistamines is important because some of the antihistaminic effect has been attributed to antimuscarinic activity.   I recommended using fexofenadine (Allegra) or diphenhydramine prior to heat exposure and as needed.    I have encouraged the use of cool wet washcloths or towels after exercise/sweating.   Return in about 4 months (around 02/25/2017), or if symptoms worsen or fail to improve.

## 2016-10-26 NOTE — Assessment & Plan Note (Addendum)
The patient's history suggests cholinergic urticaria. Antihistamines are helpful for cholinergic urticaria. The response to first and second generation antihistamines is important because some of the antihistaminic effect has been attributed to antimuscarinic activity.   I recommended using fexofenadine (Allegra) or diphenhydramine prior to heat exposure and as needed.    I have encouraged the use of cool wet washcloths or towels after exercise/sweating.
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
## 2016-10-26 NOTE — Assessment & Plan Note (Signed)
   A prescription has been provided for Nasonex nasal spray, one spray per nostril 1-2 times daily as needed. Proper nasal spray technique has been discussed and demonstrated.  Nasal saline lavage (NeilMed) has been recommended as needed and prior to medicated nasal sprays along with instructions for proper administration.  For thick post nasal drainage, add guaifenesin [phone] mg (Mucinex)  twice daily as needed with adequate hydration as discussed.

## 2016-10-31 ENCOUNTER — Telehealth: Payer: Self-pay

## 2016-10-31 MED ORDER — PREDNISONE 10 MG PO TABS: ORAL_TABLET | ORAL | 0 refills | Status: DC

## 2016-10-31 NOTE — Telephone Encounter (Signed)
Pt called and prednisone sent to pharmacy.

## 2016-10-31 NOTE — Telephone Encounter (Signed)
Please provide additional prednisone: 20mg  x 3 days, 10 mg x 2 days, 5 mg x 1 day, then stop. Have her continue Flovent as directed on her last visit. If the cough persists or progresses have her return for follow up as the cough is most likely caused by an etiology other than asthma.

## 2016-12-05 ENCOUNTER — Ambulatory Visit (INDEPENDENT_AMBULATORY_CARE_PROVIDER_SITE_OTHER): Payer: BLUE CROSS/BLUE SHIELD | Admitting: Pediatrics

## 2016-12-05 ENCOUNTER — Encounter: Payer: Self-pay | Admitting: Pediatrics

## 2016-12-05 VITALS — BP 116/60 | HR 88 | Temp 98.0°F | Resp 16

## 2016-12-05 DIAGNOSIS — J4540 Moderate persistent asthma, uncomplicated: Secondary | ICD-10-CM | POA: Insufficient documentation

## 2016-12-05 DIAGNOSIS — J3089 Other allergic rhinitis: Secondary | ICD-10-CM

## 2016-12-05 DIAGNOSIS — I10 Essential (primary) hypertension: Secondary | ICD-10-CM

## 2016-12-05 DIAGNOSIS — J301 Allergic rhinitis due to pollen: Secondary | ICD-10-CM

## 2016-12-05 DIAGNOSIS — L508 Other urticaria: Secondary | ICD-10-CM | POA: Insufficient documentation

## 2016-12-05 MED ORDER — RANITIDINE HCL 150 MG PO TABS: ORAL_TABLET | ORAL | 5 refills | Status: DC

## 2016-12-05 MED ORDER — FLUTICASONE PROPIONATE 50 MCG/ACT NA SUSP: 2.0000 | Freq: Every day | NASAL | 5 refills | Status: DC

## 2016-12-05 MED ORDER — MONTELUKAST SODIUM 10 MG PO TABS: ORAL_TABLET | ORAL | 5 refills | Status: DC

## 2016-12-05 MED ORDER — BUDESONIDE-FORMOTEROL FUMARATE 160-4.5 MCG/ACT IN AERO: INHALATION_SPRAY | RESPIRATORY_TRACT | 5 refills | Status: DC

## 2016-12-05 MED ORDER — ALBUTEROL SULFATE HFA 108 (90 BASE) MCG/ACT IN AERS: 2.0000 | INHALATION_SPRAY | RESPIRATORY_TRACT | 1 refills | Status: DC | PRN

## 2016-12-05 MED ORDER — TRIAMCINOLONE ACETONIDE 0.1 % EX CREA: TOPICAL_CREAM | CUTANEOUS | 3 refills | Status: DC

## 2016-12-05 NOTE — Patient Instructions (Addendum)
Environmental control of dust mite Claritin 10 mg tablets-take 1 tablet once or twice a day for itching Ranitidine 150 mg-take 1 tablet twice a day for heartburn-it may help itching Montelukast 10 mg tablets-take 1 tablet once a day for coughing or wheezing Fluticasone 2 sprays per nostril once a day if needed for stuffy nose Symbicort 160- 2 puffs twice a day to prevent coughing or wheezing instead of  Flovent  Proventil 2 puffs every 4 hours if needed for wheezing or coughing spells Triamcinolone 0.1% cream twice a day if needed to red itchy areas below the face Continue on your other medications Call me if you are not doing well on this treatment plan

## 2016-12-05 NOTE — Progress Notes (Signed)
[place] [address] Dept: [phone]  FOLLOW UP NOTE  Patient ID: Rachael Osborn, female    DOB: [birth year]  Age: 61 y.o. MRN: [id number] Date of Office Visit: 12/05/2016  Assessment  Chief Complaint: Allergic Rhinitis ; Asthma; Cough (was given prednisone again after last office visit. pt. saw pcp last week bc of cough and hives depo medrol injection given last week.); and Wheezing  HPI Rachael Osborn presents for follow-up of asthma, allergic rhinitis and urticaria. A few weeks ago ,she was given a steroid injection and  prednisone to  improve her asthma and hives. . She also has developed hives beginning in June. Last summer she had hives for about 2 months. She is scheduled to see a dermatologist in September regarding her recurrent skin rashes. She has had itching from Zyrtec. She has never had eczema. She does have seasonal allergic rhinitis  Current medications are outlined in the after visit summary   Drug Allergies:  Allergies  Allergen Reactions  . Zyrtec [Cetirizine] Itching  . Robaxin [Methocarbamol] Rash    Physical Exam: BP 116/60 (BP Location: Right Arm, Patient Position: Sitting, Cuff Size: Normal)   Pulse 88   Temp 98 F (36.7 C) (Oral)   Resp 16    Physical Exam  Constitutional: She is oriented to person, place, and time. She appears well-developed and well-nourished.  HENT:  Eyes normal. Ears normal. Nose mild swelling of nasal  turbinates. Pharynx normal.  Neck: Neck supple.  Cardiovascular:  S1 and S2 normal no murmurs  Pulmonary/Chest:  Clear to percussion and auscultation  Lymphadenopathy:    She has no cervical adenopathy.  Neurological: She is alert and oriented to person, place, and time.  Skin:  Clear  Psychiatric: She has a normal mood and affect. Her behavior is normal. Judgment and thought content normal.  Vitals reviewed.   Diagnostics:  FVC 2.69 L FEV1 2.34 L. Predicted FVC 2.71 L predicted FEV1 2.13 L-the  spirometry is in the normal range  Allergy skin tests were extremely positive to grass pollens, weeds, tree pollens and dust mites. Skin testing to foods was negative  Assessment and Plan: 1. Moderate persistent asthma without complication   2. Seasonal allergic rhinitis due to pollen   3. Essential hypertension   4. Chronic urticaria   5. Other allergic rhinitis   6.     Possible contact dermatitis  Meds ordered this encounter  Medications  . ranitidine (ZANTAC) 150 MG tablet    Sig: 1 tablet by mouth twice a day for heartburn-it may itching    Dispense:  64 tablet    Refill:  5  . montelukast (SINGULAIR) 10 MG tablet    Sig: 1 tablet by mouth once a day for coughing or wheezing.    Dispense:  34 tablet    Refill:  5  . fluticasone (FLONASE) 50 MCG/ACT nasal spray    Sig: Place 2 sprays into both nostrils daily.    Dispense:  16 g    Refill:  5  . budesonide-formoterol (SYMBICORT) 160-4.5 MCG/ACT inhaler    Sig: 2 puffs twice a day to prevent coughing or wheezing.    Dispense:  1 Inhaler    Refill:  5  . triamcinolone cream (KENALOG) 0.1 %    Sig: Apply twice daily to red itchy areas below face    Dispense:  45 g    Refill:  3  . albuterol (PROVENTIL HFA) 108 (90 Base) MCG/ACT inhaler  Sig: Inhale 2 puffs into the lungs every 4 (four) hours as needed for wheezing or shortness of breath.    Dispense:  1 Inhaler    Refill:  1    Patient Instructions  Environmental control of dust mite Claritin 10 mg tablets-take 1 tablet once or twice a day for itching Ranitidine 150 mg-take 1 tablet twice a day for heartburn-it may help itching Montelukast 10 mg tablets-take 1 tablet once a day for coughing or wheezing Fluticasone 2 sprays per nostril once a day if needed for stuffy nose Symbicort 160- 2 puffs twice a day to prevent coughing or wheezing instead of  Flovent  Proventil 2 puffs every 4 hours if needed for wheezing or coughing spells Triamcinolone 0.1% cream twice a day  if needed to red itchy areas below the face Continue on your other medications Call me if you are not doing well on this treatment plan   Return in about 6 weeks (around 01/16/2017).    Thank you for the opportunity to care for this patient.  Please do not hesitate to contact me with questions.  Penne Lash, M.D.  [facility] [address] [place], [place] [address] [phone]

## 2017-07-06 ENCOUNTER — Ambulatory Visit: Payer: BLUE CROSS/BLUE SHIELD | Admitting: Allergy & Immunology

## 2017-07-08 ENCOUNTER — Emergency Department (HOSPITAL_COMMUNITY)
Admission: EM | Admit: 2017-07-08 | Discharge: 2017-07-08 | Disposition: A | Discharge status: Home / Self Care | Payer: BLUE CROSS/BLUE SHIELD | Attending: Emergency Medicine | Admitting: Emergency Medicine

## 2017-07-08 ENCOUNTER — Other Ambulatory Visit: Payer: Self-pay

## 2017-07-08 ENCOUNTER — Encounter (HOSPITAL_COMMUNITY): Payer: Self-pay | Admitting: *Deleted

## 2017-07-08 ENCOUNTER — Emergency Department (HOSPITAL_COMMUNITY): Payer: BLUE CROSS/BLUE SHIELD

## 2017-07-08 DIAGNOSIS — I10 Essential (primary) hypertension: Secondary | ICD-10-CM | POA: Diagnosis not present

## 2017-07-08 DIAGNOSIS — R002 Palpitations: Secondary | ICD-10-CM | POA: Diagnosis not present

## 2017-07-08 DIAGNOSIS — J45909 Unspecified asthma, uncomplicated: Secondary | ICD-10-CM | POA: Insufficient documentation

## 2017-07-08 DIAGNOSIS — Z79899 Other long term (current) drug therapy: Secondary | ICD-10-CM | POA: Insufficient documentation

## 2017-07-08 LAB — BASIC METABOLIC PANEL
ANION GAP: 9 (ref 5–15)
BUN: 13 mg/dL (ref 6–20)
CO2: 25 mmol/L (ref 22–32)
Calcium: 9.5 mg/dL (ref 8.9–10.3)
Chloride: 102 mmol/L (ref 101–111)
Creatinine, Ser: 0.96 mg/dL (ref 0.44–1.00)
GFR calc Af Amer: >60 mL/min (ref >60)
GFR calc non Af Amer: >60 mL/min (ref >60)
GLUCOSE: 117 mg/dL — AB (ref 65–99)
Potassium: 3.7 mmol/L (ref 3.5–5.1)
Sodium: 136 mmol/L (ref 135–145)

## 2017-07-08 LAB — CBC
HEMATOCRIT: 39.7 % (ref 36.0–46.0)
HEMOGLOBIN: 13 g/dL (ref 12.0–15.0)
MCH: 28.8 pg (ref 26.0–34.0)
MCHC: 32.7 g/dL (ref 30.0–36.0)
MCV: 87.8 fL (ref 78.0–100.0)
Platelets: 243 10*3/uL (ref 150–400)
RBC: 4.52 MIL/uL (ref 3.87–5.11)
RDW: 13.5 % (ref 11.5–15.5)
WBC: 5.2 10*3/uL (ref 4.0–10.5)

## 2017-07-08 LAB — TROPONIN I
Troponin I: <0.03 ng/mL (ref <0.03)
Troponin I: <0.03 ng/mL (ref <0.03)

## 2017-07-08 NOTE — Discharge Instructions (Signed)
Your lab work has been reassuring in the emergency department today.  Make sure you follow-up with a cardiologist at your scheduled appointment tomorrow.  Avoid caffeine.  Use the nasal rinses.  Use over-the-counter Ocean nasal spray to help keep your nose moist.  Return to the ED with any worsening symptoms.
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
## 2017-07-08 NOTE — ED Triage Notes (Signed)
The pt has had tachycardia since Monday  She was seen at [facility] for the same  She is scheduled for an abalaon she has had 2 episodes of this today  Some burning sensation  mno pain now

## 2017-07-27 ENCOUNTER — Other Ambulatory Visit: Payer: Self-pay

## 2017-07-30 ENCOUNTER — Other Ambulatory Visit: Payer: Self-pay

## 2017-07-30 NOTE — Telephone Encounter (Signed)
RF for montelukast 10 mg denied, pt needs an OV

## 2017-07-31 ENCOUNTER — Other Ambulatory Visit: Payer: Self-pay | Admitting: Allergy

## 2017-07-31 MED ORDER — MONTELUKAST SODIUM 10 MG PO TABS: ORAL_TABLET | ORAL | 5 refills | Status: DC

## 2017-10-01 ENCOUNTER — Other Ambulatory Visit: Payer: Self-pay | Admitting: Allergy

## 2017-10-01 MED ORDER — BUDESONIDE-FORMOTEROL FUMARATE 160-4.5 MCG/ACT IN AERO: INHALATION_SPRAY | RESPIRATORY_TRACT | 5 refills | Status: DC

## 2017-11-05 ENCOUNTER — Encounter: Payer: Self-pay | Admitting: Family Medicine

## 2017-11-05 ENCOUNTER — Ambulatory Visit (INDEPENDENT_AMBULATORY_CARE_PROVIDER_SITE_OTHER): Payer: BLUE CROSS/BLUE SHIELD | Admitting: Family Medicine

## 2017-11-05 VITALS — BP 108/70 | HR 85 | Temp 97.9°F | Resp 16 | Ht 65.0 in | Wt 189.0 lb

## 2017-11-05 DIAGNOSIS — J301 Allergic rhinitis due to pollen: Secondary | ICD-10-CM | POA: Diagnosis not present

## 2017-11-05 DIAGNOSIS — I10 Essential (primary) hypertension: Secondary | ICD-10-CM

## 2017-11-05 DIAGNOSIS — L508 Other urticaria: Secondary | ICD-10-CM | POA: Diagnosis not present

## 2017-11-05 DIAGNOSIS — J4540 Moderate persistent asthma, uncomplicated: Secondary | ICD-10-CM

## 2017-11-05 NOTE — Progress Notes (Signed)
[place] [address] Dept: [phone]  FOLLOW UP NOTE  Patient ID: Rachael Osborn, female    DOB: 01-05-1956  Age: 62 y.o. MRN: [id number] Date of Office Visit: 11/05/2017  Assessment  Chief Complaint: Asthma; Allergies; and Shortness of Breath  HPI Rachael Osborn is a 62 year old female who presents to the clinic for a follow up visit. She was last seen in this clinic on 12/05/2016 by Dr. Shaune Leeks for evaluation of asthma, allergic rhinitis, and allergic urticaria. At that time, she continued using Symbicort 160, montelukast, and albuterol. Skin tests at that visit, were positive to grass, weed, and tree pollens and dust mites. Skin testing to foods was negative. She began Claritin and ranitidine to control itching and triamcinolone for red itchy areas below her neck.  At today's visit, she reports her asthma has been well controlled with intermittent symptoms of shortness of breath with activity. She denies wheezing or cough. She is currently using Symbicort 160- 2 puffs twice a day, however, she frequently misses the nighttime Symbicort dose. She continues montelukast once a day and uses her albuterol inhaler infrequently.   Allergic rhinitis is reported as well controlled with symptoms including occasional nasal congestion and runny nose. She continues to use Vicks Vapo rub once a day, Claritin once a day, and Nasonex as needed.  Allergic urticaria is reported as well controlled with Claritin, ranitidine, and montelukast. She is currently followed by Dr.  Melina Copa, Dermatology specialist at [facility].  Her current medications are listed in the chart.    Drug Allergies:  Allergies  Allergen Reactions  . Zyrtec [Cetirizine] Itching  . Robaxin [Methocarbamol] Rash    Physical Exam: BP 108/70   Pulse 85   Temp 97.9 F (36.6 C) (Oral)   Resp 16   Ht 5\' 5"  (1.651 m)   Wt 189 lb (85.7 kg)   SpO2 95%   BMI 31.45 kg/m    Physical Exam    Constitutional: She is oriented to person, place, and time. She appears well-developed and well-nourished.  HENT:  Head: Normocephalic.  Right Ear: External ear normal.  Left Ear: External ear normal.  Mouth/Throat: Oropharynx is clear and moist.  Bilateral nares slightly erythematous and edematous with clear nasal drainage noted. Pharynx normal. Ears normal. Eyes normal.   Eyes: Conjunctivae are normal.  Neck: Normal range of motion. Neck supple.  Cardiovascular: Normal rate, regular rhythm and normal heart sounds.  No murmur noted.  Pulmonary/Chest: Effort normal and breath sounds normal.  Lungs clear to auscultation  Musculoskeletal: Normal range of motion.  Neurological: She is alert and oriented to person, place, and time.  Skin: Skin is warm and dry.  Psychiatric: She has a normal mood and affect. Her behavior is normal. Judgment and thought content normal.  Vitals reviewed.   Diagnostics: FVC 2.85, FEV1 2.38. Predicted FVC 2.69, predicted FEV1 2.10. Spirometry is within the normal range.  Assessment and Plan: 1. Moderate persistent asthma without complication   2. Essential hypertension   3. Seasonal allergic rhinitis due to pollen   4. Chronic urticaria     Patient Instructions  Continue environmental control of dust mite Claritin 10 mg tablets-take 1 tablet once or twice a day for itching Ranitidine 150 mg-take 1 tablet twice a day for heartburn-it may help itching Montelukast 10 mg tablets-take 1 tablet once a day for coughing or wheezing Fluticasone 2 sprays per nostril once a day if needed for stuffy nose Symbicort  160- 2 puffs twice a day to prevent coughing or wheezing  Proventil 2 puffs every 4 hours if needed for wheezing or coughing spells Triamcinolone 0.1% cream twice a day if needed to red itchy areas below the face Continue on your other medications Call me if you are not doing well on this treatment plan  Follow up in 6 months or sooner if  needed   Return in about 6 months (around 05/08/2018).   Thank you for the opportunity to care for this patient.  Please do not hesitate to contact me with questions.  Gareth Morgan, FNP [facility]  I have provided oversight concerning Gareth Morgan' evaluation and treatment of this patient's health issues addressed during today's encounter. I agree with the assessment and therapeutic plan as outlined in the note.   Thank you for the opportunity to care for this patient.  Please do not hesitate to contact me with questions.  Penne Lash, M.D.  [facility] [address] [place], [place] [address] [phone]

## 2017-11-05 NOTE — Patient Instructions (Addendum)
Continue environmental control of dust mite Claritin 10 mg tablets-take 1 tablet once or twice a day for itching Ranitidine 150 mg-take 1 tablet twice a day for heartburn-it may help itching Montelukast 10 mg tablets-take 1 tablet once a day for coughing or wheezing Fluticasone 2 sprays per nostril once a day if needed for stuffy nose Symbicort 160- 2 puffs twice a day to prevent coughing or wheezing  Proventil 2 puffs every 4 hours if needed for wheezing or coughing spells Triamcinolone 0.1% cream twice a day if needed to red itchy areas below the face Continue on your other medications Call me if you are not doing well on this treatment plan  Follow up in 6 months or sooner if needed

## 2017-11-07 ENCOUNTER — Other Ambulatory Visit: Payer: Self-pay | Admitting: Allergy

## 2017-11-07 MED ORDER — RANITIDINE HCL 150 MG PO TABS: ORAL_TABLET | ORAL | 5 refills | Status: DC

## 2017-12-22 ENCOUNTER — Emergency Department (HOSPITAL_COMMUNITY)
Admission: EM | Admit: 2017-12-22 | Discharge: 2017-12-22 | Disposition: A | Discharge status: Home / Self Care | Payer: BLUE CROSS/BLUE SHIELD | Attending: Emergency Medicine | Admitting: Emergency Medicine

## 2017-12-22 ENCOUNTER — Encounter (HOSPITAL_COMMUNITY): Payer: Self-pay | Admitting: Emergency Medicine

## 2017-12-22 DIAGNOSIS — Z79899 Other long term (current) drug therapy: Secondary | ICD-10-CM | POA: Insufficient documentation

## 2017-12-22 DIAGNOSIS — R51 Headache: Secondary | ICD-10-CM | POA: Insufficient documentation

## 2017-12-22 DIAGNOSIS — I10 Essential (primary) hypertension: Secondary | ICD-10-CM

## 2017-12-22 DIAGNOSIS — Z7982 Long term (current) use of aspirin: Secondary | ICD-10-CM | POA: Insufficient documentation

## 2017-12-22 DIAGNOSIS — R519 Headache, unspecified: Secondary | ICD-10-CM

## 2017-12-22 MED ORDER — ORPHENADRINE CITRATE ER 100 MG PO TB12: 100.0000 mg | ORAL_TABLET | Freq: Two times a day (BID) | ORAL | 0 refills | Status: DC

## 2017-12-22 MED ORDER — CLONIDINE HCL 0.1 MG PO TABS: 0.1000 mg | ORAL_TABLET | Freq: Three times a day (TID) | ORAL | 0 refills | Status: DC | PRN

## 2017-12-22 MED ORDER — CLONIDINE HCL 0.1 MG PO TABS
0.1000 mg | ORAL_TABLET | Freq: Once | ORAL | Status: AC
  Administered 2017-12-22: 0.1 mg via ORAL
  Filled 2017-12-22: qty 1

## 2017-12-22 NOTE — ED Triage Notes (Addendum)
Patient here from home with complaints of hypertension. Headaches. BP meds that are "not working". Dr appt on Wednesday.
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
## 2017-12-22 NOTE — ED Notes (Signed)
Pt placed on cardiac monitor 

## 2017-12-22 NOTE — Discharge Instructions (Signed)
1.  See your doctor this week as scheduled. 2.  Try Norflex for muscle spasm and possible tension headache. 3.  Use clonidine 0.1 mg every 8 hours if needed for systolic blood pressure (upper number) greater than 308 or diastolic blood pressure (lower number) greater than 90. 4 return to the emergency department if you develop problems with your vision, confusion, worsening or changing headache, weakness numbness tingling or other concerning symptoms.

## 2018-01-15 ENCOUNTER — Ambulatory Visit (INDEPENDENT_AMBULATORY_CARE_PROVIDER_SITE_OTHER): Payer: BLUE CROSS/BLUE SHIELD | Admitting: Cardiovascular Disease

## 2018-01-15 ENCOUNTER — Telehealth: Payer: Self-pay | Admitting: Cardiovascular Disease

## 2018-01-15 ENCOUNTER — Encounter: Payer: Self-pay | Admitting: Cardiovascular Disease

## 2018-01-15 VITALS — BP 142/90 | HR 86 | Ht 66.0 in | Wt 184.0 lb

## 2018-01-15 DIAGNOSIS — R002 Palpitations: Secondary | ICD-10-CM

## 2018-01-15 DIAGNOSIS — I200 Unstable angina: Secondary | ICD-10-CM

## 2018-01-15 DIAGNOSIS — Z79899 Other long term (current) drug therapy: Secondary | ICD-10-CM

## 2018-01-15 DIAGNOSIS — R079 Chest pain, unspecified: Secondary | ICD-10-CM | POA: Insufficient documentation

## 2018-01-15 DIAGNOSIS — G4733 Obstructive sleep apnea (adult) (pediatric): Secondary | ICD-10-CM | POA: Insufficient documentation

## 2018-01-15 MED ORDER — LISINOPRIL 5 MG PO TABS: 5.0000 mg | ORAL_TABLET | Freq: Every day | ORAL | 3 refills | Status: DC

## 2018-01-15 NOTE — Assessment & Plan Note (Signed)
Symptoms compatible with obstructive sleep apnea with outpatient sleep study already ordered.

## 2018-01-15 NOTE — Progress Notes (Signed)
01/15/2018 Rachael Osborn   Jul 13, 1955  [id number]  Primary Physician [name], DO Primary Cardiologist: Lorretta Harp MD [name], [place]  HPI:  Rachael Osborn is a 62 y.o. moderately overweight married African-American female mother of 1 child, grandmother of 1 step grandchild referred by Dr. Duwaine Maxin for cardiovascular evaluation because of hypertension and palpitations.  She works as a Programme researcher, broadcasting/film/video at [employer].  She was the [employer] for 11 years.  She has an Loss adjuster, chartered in a law degree as well.  Her risk factors include treated hypertension.  She is never had a heart attack or stroke.  She she does complain of some chest pain and left upper extremity intermittent numbness.  She also has reactive airways disease.  She is scheduled to have a sleep study in the upcoming future.  She does not drink caffeine or other stimulants.  She did have SVT ablation by Dr. Ola Spurr in [facility] in March of this year with several episodes of symptomatic SVT prior to that.  She is on diltiazem.  She does complain of intermittent palpitations as well.   Current Meds  Medication Sig  . albuterol (PROVENTIL HFA;VENTOLIN HFA) 108 (90 Base) MCG/ACT inhaler Inhale into the lungs.  [name] aspirin EC 81 MG tablet Take 81 mg by mouth daily.  . budesonide-formoterol (SYMBICORT) 160-4.5 MCG/ACT inhaler 2 puffs into the lungs twice a day to prevent cough and wheeze Rinse gargle and spit after use.  . diltiazem (CARDIZEM LA) 180 MG 24 hr tablet Take 120 mg by mouth daily.   . diphenhydrAMINE (BENADRYL) 25 mg capsule Take 25 mg by mouth every 6 (six) hours as needed for allergies.  . fluticasone (FLONASE) 50 MCG/ACT nasal spray Place 2 sprays into both nostrils daily. (Patient taking differently: Place 1 spray into both nostrils as needed. )  . fluticasone (FLOVENT HFA) 110 MCG/ACT inhaler Inhale 2 puffs into the lungs 2 (two) times daily.  [name] gabapentin (NEURONTIN) 400 MG  capsule Take 400 mg by mouth daily.  . hydrochlorothiazide (HYDRODIURIL) 25 MG tablet Take 25 mg by mouth daily.   [name] loratadine (CLARITIN) 10 MG tablet Take 10 mg by mouth daily.  . meloxicam (MOBIC) 15 MG tablet Take 15 mg by mouth as needed.   . mometasone (NASONEX) 50 MCG/ACT nasal spray Place 2 sprays into the nose daily as needed.  . montelukast (SINGULAIR) 10 MG tablet 1 tablet by mouth once a day for coughing or wheezing.  . Multiple Vitamin (MULTIVITAMIN WITH MINERALS) TABS tablet Take 1 tablet by mouth daily.  . orphenadrine (NORFLEX) 100 MG tablet Take 1 tablet (100 mg total) by mouth 2 (two) times daily.  . ranitidine (ZANTAC) 150 MG tablet 1 tablet by mouth twice a day for heartburn-it may help itching  . triamcinolone cream (KENALOG) 0.1 % Apply twice daily to red itchy areas below face (Patient taking differently: Apply 1 application topically as needed. Apply twice daily to red itchy areas below face)  . TURMERIC PO Take 2 capsules by mouth daily.   Current Facility-Administered Medications for the 01/15/18 encounter (Office Visit) with Lorretta Harp, MD  Medication  . predniSONE (DELTASONE) tablet 10 mg     Allergies  Allergen Reactions  . Zyrtec [Cetirizine] Itching  . Robaxin [Methocarbamol] Rash    Social History   Socioeconomic History  . Marital status: Married    Spouse name: Not on file  . Number of children:  Not on file  . Years of education: Not on file  . Highest education level: Not on file  Occupational History  . Not on file  Social Needs  . Financial resource strain: Not on file  . Food insecurity:    Worry: Not on file    Inability: Not on file  . Transportation needs:    Medical: Not on file    Non-medical: Not on file  Tobacco Use  . Smoking status: Never Smoker  . Smokeless tobacco: Never Used  Substance and Sexual Activity  . Alcohol use: Yes    Comment: socially  . Drug use: No  . Sexual activity: Not on file  Lifestyle  .  Physical activity:    Days per week: Not on file    Minutes per session: Not on file  . Stress: Not on file  Relationships  . Social connections:    Talks on phone: Not on file    Gets together: Not on file    Attends religious service: Not on file    Active member of club or organization: Not on file    Attends meetings of clubs or organizations: Not on file    Relationship status: Not on file  . Intimate partner violence:    Fear of current or ex partner: Not on file    Emotionally abused: Not on file    Physically abused: Not on file    Forced sexual activity: Not on file  Other Topics Concern  . Not on file  Social History Narrative  . Not on file     Review of Systems: General: negative for chills, fever, night sweats or weight changes.  Cardiovascular: negative for chest pain, dyspnea on exertion, edema, orthopnea, palpitations, paroxysmal nocturnal dyspnea or shortness of breath Dermatological: negative for rash Respiratory: negative for cough or wheezing Urologic: negative for hematuria Abdominal: negative for nausea, vomiting, diarrhea, bright red blood per rectum, melena, or hematemesis Neurologic: negative for visual changes, syncope, or dizziness All other systems reviewed and are otherwise negative except as noted above.    Blood pressure (!) 142/90, pulse 86, height 5\' 6"  (1.676 m), weight 184 lb (83.5 kg), SpO2 95 %.  General appearance: alert and no distress Neck: no adenopathy, no carotid bruit, no JVD, supple, symmetrical, trachea midline and thyroid not enlarged, symmetric, no tenderness/mass/nodules Lungs: clear to auscultation bilaterally Heart: regular rate and rhythm, S1, S2 normal, no murmur, click, rub or gallop Extremities: extremities normal, atraumatic, no cyanosis or edema Pulses: 2+ and symmetric Skin: Skin color, texture, turgor normal. No rashes or lesions Neurologic: Alert and oriented X 3, normal strength and tone. Normal symmetric  reflexes. Normal coordination and gait  EKG not performed today  ASSESSMENT AND PLAN:   Essential hypertension History of essential hypertension her blood pressure measured today 142/90.  She is on diltiazem and hydrochlorothiazide.  She is aware of salt restriction.  She checks her blood pressure at home and typically is on the higher side.  I am going to add lisinopril 5 mg a day, check a basic metabolic panel in 2 weeks and have her see Cyril Mourning back in 1 month for follow-up of her blood pressure log and lab results with medicine titration.  Palpitations History of SVT ablation by Dr. Ola Spurr in [facility] in March of this year.  She had several episodes of PSVT prior to that.  She is on diltiazem 180 mg a day.  She does complain of some continued capitation's.  She does not drink caffeine.  I am going to get a 2-week event monitor to further evaluate.  Chest pain History of chest pain and left arm intermittent numbness.  I am going to get an exercise Myoview stress test to further evaluate.  Obstructive sleep apnea Symptoms compatible with obstructive sleep apnea with outpatient sleep study already ordered.      Lorretta Harp MD FACP,FACC,FAHA, [facility] 01/15/2018 8:55 AM

## 2018-01-15 NOTE — Telephone Encounter (Signed)
New message:      Pt c/o medication issue:  1. Name of Medication:   2. How are you currently taking this medication (dosage and times per day)? Take 1 tablet (5 mg total) by mouth daily.  3. Are you having a reaction (difficulty breathing--STAT)? No  4. What is your medication issue? Pt calls and states she went to pick up this medication and was reading the side effects and other warnings of this medication and would not like to take this medication. She states the pharmacist gave her two different types of medication that she could try which are : Losartan or Bidil

## 2018-01-15 NOTE — Patient Instructions (Signed)
Medication Instructions:  Your physician has recommended you make the following change in your medication:  1) START Lisinopril 5mg  tablet by mouth ONCE daily   Labwork: Your physician recommends that you return for lab work in: BMET - 1 week   Testing/Procedures: Your physician has recommended that you wear an event monitor. Event monitors are medical devices that record the heart's electrical activity. Doctors most often [place] these monitors to diagnose arrhythmias. Arrhythmias are problems with the speed or rhythm of the heartbeat. The monitor is a small, portable device. You can wear one while you do your normal daily activities. This is usually used to diagnose what is causing palpitations/syncope (passing out).  Your physician has requested that you have en exercise stress myoview. For further information please visit [email or web address]. Please follow instruction sheet, as given.    Follow-Up: Your physician recommends that you schedule a follow-up appointment in: 6 weeks with Dr. Gwenlyn Found.  Dr. Gwenlyn Found would like you to check your blood pressure DAILY for the next 4 weeks.  Keep a journal of these daily BP and heart rate reading and call our office with the results.     Any Other Special Instructions Will Be Listed Below (If Applicable).     If you need a refill on your cardiac medications before your next appointment, please call your pharmacy.

## 2018-01-15 NOTE — Telephone Encounter (Signed)
Dr.Berry is aware of the patient's concern regarding Lisinopril. At today's o/v she was to have a 4wk BP appt with the Pharm-D.  Per Dr.Berry since she is not starting Lisinopril she will need a first available appt with the Pharm-D BP clinic.  lmtcb to schedule  Message also fwd to pcc

## 2018-01-15 NOTE — Assessment & Plan Note (Signed)
History of essential hypertension her blood pressure measured today 142/90.  She is on diltiazem and hydrochlorothiazide.  She is aware of salt restriction.  She checks her blood pressure at home and typically is on the higher side.  I am going to add lisinopril 5 mg a day, check a basic metabolic panel in 2 weeks and have her see Cyril Mourning back in 1 month for follow-up of her blood pressure log and lab results with medicine titration.

## 2018-01-15 NOTE — Assessment & Plan Note (Signed)
History of SVT ablation by Dr. Ola Spurr in [facility] in March of this year.  She had several episodes of PSVT prior to that.  She is on diltiazem 180 mg a day.  She does complain of some continued capitation's.  She does not drink caffeine.  I am going to get a 2-week event monitor to further evaluate.

## 2018-01-15 NOTE — Assessment & Plan Note (Signed)
History of chest pain and left arm intermittent numbness.  I am going to get an exercise Myoview stress test to further evaluate.

## 2018-01-17 ENCOUNTER — Telehealth: Payer: Self-pay | Admitting: Pediatrics

## 2018-01-17 ENCOUNTER — Telehealth (HOSPITAL_COMMUNITY): Payer: Self-pay | Admitting: *Deleted

## 2018-01-17 NOTE — Telephone Encounter (Signed)
Please call patient back, she is asking for an alternative medication to the ranitidine to be called in.

## 2018-01-17 NOTE — Telephone Encounter (Signed)
Left message on voicemail in reference to upcoming appointment scheduled for 01/24/18 Phone number given for a call back so details instructions can be given. Rachael Osborn   

## 2018-01-18 ENCOUNTER — Ambulatory Visit (INDEPENDENT_AMBULATORY_CARE_PROVIDER_SITE_OTHER): Payer: BLUE CROSS/BLUE SHIELD | Admitting: Allergy & Immunology

## 2018-01-18 ENCOUNTER — Telehealth: Payer: Self-pay | Admitting: Cardiovascular Disease

## 2018-01-18 ENCOUNTER — Encounter: Payer: Self-pay | Admitting: Allergy & Immunology

## 2018-01-18 VITALS — BP 110/60 | HR 97 | Temp 98.2°F | Resp 20

## 2018-01-18 DIAGNOSIS — G4489 Other headache syndrome: Secondary | ICD-10-CM

## 2018-01-18 DIAGNOSIS — J3089 Other allergic rhinitis: Secondary | ICD-10-CM | POA: Diagnosis not present

## 2018-01-18 DIAGNOSIS — J4540 Moderate persistent asthma, uncomplicated: Secondary | ICD-10-CM

## 2018-01-18 DIAGNOSIS — J302 Other seasonal allergic rhinitis: Secondary | ICD-10-CM

## 2018-01-18 DIAGNOSIS — L2084 Intrinsic (allergic) eczema: Secondary | ICD-10-CM | POA: Diagnosis not present

## 2018-01-18 MED ORDER — OMEPRAZOLE 20 MG PO CPDR: 20.0000 mg | DELAYED_RELEASE_CAPSULE | Freq: Every day | ORAL | 5 refills | Status: DC

## 2018-01-18 MED ORDER — BUDESONIDE-FORMOTEROL FUMARATE 160-4.5 MCG/ACT IN AERO: INHALATION_SPRAY | RESPIRATORY_TRACT | 5 refills | Status: DC

## 2018-01-18 MED ORDER — SODIUM CHLORIDE 0.9 % IV SOLN
20.00 | INTRAVENOUS | Status: DC
Start: ? — End: 2018-01-18

## 2018-01-18 MED ORDER — FLUTICASONE PROPIONATE 50 MCG/ACT NA SUSP: 2.0000 | Freq: Every day | NASAL | 5 refills | Status: DC

## 2018-01-18 MED ORDER — MONTELUKAST SODIUM 10 MG PO TABS: ORAL_TABLET | ORAL | 5 refills | Status: DC

## 2018-01-18 MED ORDER — ALBUTEROL SULFATE HFA 108 (90 BASE) MCG/ACT IN AERS: 2.0000 | INHALATION_SPRAY | Freq: Four times a day (QID) | RESPIRATORY_TRACT | 2 refills | Status: DC | PRN

## 2018-01-18 NOTE — Progress Notes (Signed)
FOLLOW UP  Date of Service/Encounter:  01/18/18   Assessment:   Moderate persistent asthma without complication  Seasonal and perennial allergic rhinitis  Intrinsic atopic dermatitis  Headache - ? migraine   Plan/Recommendations:   1. Moderate persistent asthma without complication - Lung testing looked better than normal.  - Daily controller medication(s): Singulair 10mg  daily and Symbicort 160/4.67mcg two puffs twice daily with spacer - Prior to physical activity: Proventil 2 puffs 10-15 minutes before physical activity. - Rescue medications: Proventil 4 puffs every 4-6 hours as needed - Asthma control goals:  * Full participation in all desired activities (may need albuterol before activity) * Albuterol use two time or less a week on average (not counting use with activity) * Cough interfering with sleep two time or less a month * Oral steroids no more than once a year * No hospitalizations  2. Seasonal and perennial allergic rhinitis (grasses, weeds, trees, and dust mite) - Continue with fluticasone nasal spray 1-2 sprays per nostril daily. - Continue with loratadine 10mg  daily.  - Your sinus CT was completely normal (results provided).  3. Intrinsic atopic dermatitis - Continue with triamcinolone 0.1% cream twice daily as needed for flares.   4. Reflux - We will change you from ranitidine to omeprazole 20mg  daily.  5. Return in about 6 months (around 07/19/2018).   Subjective:   Rachael Osborn is a 62 y.o. female presenting today for follow up of  Chief Complaint  Patient presents with  . Asthma    headaches, elevated BP, rapid heart beat and chest tightness    Rachael Osborn has a history of the following: Patient Active Problem List   Diagnosis Date Noted  . Palpitations 01/15/2018  . Chest pain 01/15/2018  . Obstructive sleep apnea 01/15/2018  . Moderate persistent asthma without complication 33/29/5188  . Chronic urticaria 12/05/2016  . Asthma  with acute exacerbation 10/26/2016  . Cholinergic urticaria 10/26/2016  . Other allergic rhinitis 12/14/2015  . Mild persistent asthma 12/14/2015  . Contact dermatitis due to chemicals 12/14/2015  . Essential hypertension 12/14/2015  . Seasonal allergic rhinitis due to pollen 04/09/2015  . Mild intermittent asthma 04/09/2015    History obtained from: chart review and patient.  Rachael Osborn Primary Care Provider is [name], DO.     Rachael Osborn is a 61 y.o. female presenting for a sick visit.  She was last seen in July 2019.  At that time, she was doing fairly well.  She was continued on Symbicort 160/4.5 mcg 2 puffs twice daily as well as Proventil as needed.  She was also continued on Flonase, montelukast, ranitidine, and Claritin.  She was continued on triamcinolone 0.1% cream twice daily as needed.  Since the last visit, she has had problems with headaches. She went to the ED and had a headache "cocktail" with a resulting evated blood pressure. She has had several changes to her blood pressure medications with minimal improvements; today her blood pressure is actually normal and incidentally this is the first day that she has not had a headahce. tIn conjunction with the headache, she also reports ightness in her chest. However, her spirometry is completely normal today.   She heard about the cancer-linked chemical being in Zantac, so she stopped this. She now needs something else for her GERD. She is interested in class action lawsuits regarding the cancer causing ingredient. In any case, she needs something else for her GERD since she wason ranitidine. She does report  a burning sensation for around a few seconds, but this is a distinct sensation compared to the normal sensation of reflux. In retrospect, this has been ongoing since she had her cardiac albation performed.   She recently had a sinus CT that was normal. Her last testing was performed in August 2018 and was positive to  grasses, weeds, trees, and dust mite. She has been using her Nasacort. She remains on her Flonase and her loratadine. She thinks that she might need an antibiotic.  She remains on her Symbicort two puffs BID as well as Proventil as needed. Otherwise, there have been no changes to her past medical history, surgical history, family history, or social history.    Review of Systems: a 14-point review of systems is pertinent for what is mentioned in HPI.  Otherwise, all other systems were negative. Constitutional: negative other than that listed in the HPI Eyes: negative other than that listed in the HPI Ears, nose, mouth, throat, and face: negative other than that listed in the HPI Respiratory: negative other than that listed in the HPI Cardiovascular: negative other than that listed in the HPI Gastrointestinal: negative other than that listed in the HPI Genitourinary: negative other than that listed in the HPI Integument: negative other than that listed in the HPI Hematologic: negative other than that listed in the HPI Musculoskeletal: negative other than that listed in the HPI Neurological: negative other than that listed in the HPI Allergy/Immunologic: negative other than that listed in the HPI    Objective:   Blood pressure 110/60, pulse 97, temperature 98.2 F (36.8 C), temperature source Oral, resp. rate 20, SpO2 96 %. There is no height or weight on file to calculate BMI.   Physical Exam:  General: Alert, interactive, in no acute distress. Pleasant. Well appearing.  Eyes: No conjunctival injection present on the right, No conjunctival injection present on the left, no discharge on the right, no discharge on the left and no Horner-Trantas dots present. PERRL bilaterally. EOMI without pain. No photophobia.  Ears: Right TM pearly gray with normal light reflex, Left TM pearly gray with normal light reflex, Right TM intact without perforation and Left TM intact without perforation.    Nose/Throat: External nose within normal limits and septum midline. Turbinates edematous and pale with clear discharge. Posterior oropharynx erythematous with cobblestoning in the posterior oropharynx. Tonsils 2+ without exudates.  Tongue without thrush. Lungs: Clear to auscultation without wheezing, rhonchi or rales. No increased work of breathing. CV: Normal S1/S2. No murmurs. Capillary refill <2 seconds.  Skin: Warm and dry, without lesions or rashes. Neuro:   Grossly intact. No focal deficits appreciated. Responsive to questions.  Diagnostic studies: none  Spirometry: results normal (FEV1: 2.35/112%, FVC: 2.59/96%, FEV1/FVC: 91%).    Spirometry consistent with normal pattern.  Allergy Studies: none      Salvatore Marvel, MD  [facility]

## 2018-01-18 NOTE — Telephone Encounter (Signed)
New message:       Pt is returning a call pt states she is driving and would like a call back instead of her calling us back.

## 2018-01-18 NOTE — Patient Instructions (Addendum)
1. Moderate persistent asthma without complication - Lung testing looked better than normal.  - Daily controller medication(s): Singulair 10mg  daily and Symbicort 160/4.68mcg two puffs twice daily with spacer - Prior to physical activity: Proventil 2 puffs 10-15 minutes before physical activity. - Rescue medications: Proventil 4 puffs every 4-6 hours as needed - Asthma control goals:  * Full participation in all desired activities (may need albuterol before activity) * Albuterol use two time or less a week on average (not counting use with activity) * Cough interfering with sleep two time or less a month * Oral steroids no more than once a year * No hospitalizations  2. Seasonal and perennial allergic rhinitis (grasses, weeds, trees, and dust mite) - Continue with fluticasone nasal spray 1-2 sprays per nostril daily. - Continue with loratadine 10mg  daily.  - Your sinus CT was completely normal (results provided).  3. Intrinsic atopic dermatitis - Continue with triamcinolone 0.1% cream twice daily as needed for flares.   4. Reflux - We will change you from ranitidine to omeprazole 20mg  daily.  5. Return in about 6 months (around 07/19/2018).   Please inform us of any Emergency Department visits, hospitalizations, or changes in symptoms. Call us before going to the ED for breathing or allergy symptoms since we might be able to fit you in for a sick visit. Feel free to contact us anytime with any questions, problems, or concerns.  It was a pleasure to meet you today!  Websites that have reliable patient information: 1. American Academy of Asthma, Allergy, and Immunology: www.aaaai.org 2. Food Allergy Research and Education (FARE): foodallergy.org 3. Mothers of Asthmatics: http://www.asthmacommunitynetwork.org 4. American College of Allergy, Asthma, and Immunology: MonthlyElectricBill.co.uk   Make sure you are registered to vote! If you have moved or changed any of your contact information, you  will need to get this updated before voting!

## 2018-01-18 NOTE — Telephone Encounter (Signed)
Called patient and received no answer. She does have an office visit this afternoon and we can discuss the medication issue at that time.

## 2018-01-18 NOTE — Telephone Encounter (Signed)
Follow Up:    Patient is requesting a call back about the  appt 9/26

## 2018-01-19 ENCOUNTER — Encounter: Payer: Self-pay | Admitting: Allergy & Immunology

## 2018-01-21 ENCOUNTER — Telehealth (HOSPITAL_COMMUNITY): Payer: Self-pay | Admitting: *Deleted

## 2018-01-21 NOTE — Telephone Encounter (Signed)
Patient given detailed instructions per Myocardial Perfusion Study Information Sheet for the test on 01/24/18 at 0945. Patient notified to arrive 15 minutes early and that it is imperative to arrive on time for appointment to keep from having the test rescheduled.  If you need to cancel or reschedule your appointment, please call the office within 24 hours of your appointment. . Patient verbalized understanding.Rachael Osborn    

## 2018-01-24 ENCOUNTER — Ambulatory Visit (INDEPENDENT_AMBULATORY_CARE_PROVIDER_SITE_OTHER): Payer: BLUE CROSS/BLUE SHIELD

## 2018-01-24 ENCOUNTER — Ambulatory Visit (HOSPITAL_COMMUNITY): Payer: BLUE CROSS/BLUE SHIELD | Attending: Cardiovascular Disease

## 2018-01-24 VITALS — Ht 66.0 in | Wt 184.0 lb

## 2018-01-24 DIAGNOSIS — R002 Palpitations: Secondary | ICD-10-CM | POA: Diagnosis not present

## 2018-01-24 DIAGNOSIS — R0789 Other chest pain: Secondary | ICD-10-CM

## 2018-01-24 DIAGNOSIS — I200 Unstable angina: Secondary | ICD-10-CM | POA: Diagnosis present

## 2018-01-24 LAB — MYOCARDIAL PERFUSION IMAGING
CHL CUP NUCLEAR SDS: 2
CHL CUP NUCLEAR SSS: 2
CHL CUP RESTING HR STRESS: 78 {beats}/min
CSEPED: 6 min
CSEPEDS: 0 s
CSEPEW: 7 METS
CSEPPHR: 134 {beats}/min
LV dias vol: 69 mL (ref 46–106)
LV sys vol: 21 mL
MPHR: 157 {beats}/min
Percent HR: 1 %
SRS: 0
TID: 1

## 2018-01-24 MED ORDER — TECHNETIUM TC 99M TETROFOSMIN IV KIT
30.7000 | PACK | Freq: Once | INTRAVENOUS | Status: AC | PRN
  Administered 2018-01-24: 30.7 via INTRAVENOUS
  Filled 2018-01-24: qty 31

## 2018-01-24 MED ORDER — TECHNETIUM TC 99M TETROFOSMIN IV KIT
10.8000 | PACK | Freq: Once | INTRAVENOUS | Status: AC | PRN
  Administered 2018-01-24: 10.8 via INTRAVENOUS
  Filled 2018-01-24: qty 11

## 2018-01-25 LAB — BASIC METABOLIC PANEL
BUN / CREAT RATIO: 11 — AB (ref 12–28)
BUN: 9 mg/dL (ref 8–27)
CALCIUM: 10.1 mg/dL (ref 8.7–10.3)
CHLORIDE: 100 mmol/L (ref 96–106)
CO2: 26 mmol/L (ref 20–29)
Creatinine, Ser: 0.82 mg/dL (ref 0.57–1.00)
GFR calc non Af Amer: 76 mL/min/{1.73_m2} (ref >59)
GFR, EST AFRICAN AMERICAN: 88 mL/min/{1.73_m2} (ref >59)
GLUCOSE: 142 mg/dL — AB (ref 65–99)
POTASSIUM: 3.6 mmol/L (ref 3.5–5.2)
Sodium: 141 mmol/L (ref 134–144)

## 2018-01-29 ENCOUNTER — Ambulatory Visit (INDEPENDENT_AMBULATORY_CARE_PROVIDER_SITE_OTHER): Payer: BLUE CROSS/BLUE SHIELD | Admitting: Pharmacist Clinician (PhC)/ Clinical Pharmacy Specialist

## 2018-01-29 ENCOUNTER — Encounter: Payer: Self-pay | Admitting: Pharmacist Clinician (PhC)/ Clinical Pharmacy Specialist

## 2018-01-29 DIAGNOSIS — I10 Essential (primary) hypertension: Secondary | ICD-10-CM

## 2018-01-29 MED ORDER — VALSARTAN 80 MG PO TABS: 80.0000 mg | ORAL_TABLET | Freq: Every day | ORAL | 5 refills | Status: DC

## 2018-01-29 NOTE — Assessment & Plan Note (Signed)
Patient with essential hypertension, currently on losartan 25 mg, diltiazem 180 mg and hctz 25 mg.  Although her home average BP is close to goal at 130/88, she still has a wide range of readings.  Because we don't always see good 24 hr control with either losartan or hctz, we discussed switching one or both of these out for similar medications.  Patient agreeable, as she understands why she cannot go back on amlodipine at this time.  Will have her stop the losartan and start valsartan 80 mg once daily.  She is due to see Dr. Gwenlyn Found back at the end of the month, so she should continue with home monitoring once or twice daily until then.  At that time if she is still having labile readings, we can consider switching hctz to chlorthalidone.

## 2018-01-29 NOTE — Patient Instructions (Addendum)
Return for a a follow up appointment with Dr. Gwenlyn Found  Your blood pressure today is 126/88 (goal is < 130/80)  Check your blood pressure at home daily and keep record of the readings.  Take your BP meds as follows:  Switch losartan to valsartan 80 mg once daily  Continue with all other medications  Bring all of your meds, your BP cuff and your record of home blood pressures to your next appointment.  Exercise as you're able, try to walk approximately 30 minutes per day.  Keep salt intake to a minimum, especially watch canned and prepared boxed foods.  Eat more fresh fruits and vegetables and fewer canned items.  Avoid eating in fast food restaurants.    HOW TO TAKE YOUR BLOOD PRESSURE: . Rest 5 minutes before taking your blood pressure. .  Don't smoke or drink caffeinated beverages for at least 30 minutes before. . Take your blood pressure before (not after) you eat. . Sit comfortably with your back supported and both feet on the floor (don't cross your legs). . Elevate your arm to heart level on a table or a desk. . Use the proper sized cuff. It should fit smoothly and snugly around your bare upper arm. There should be enough room to slip a fingertip under the cuff. The bottom edge of the cuff should be 1 inch above the crease of the elbow. . Ideally, take 3 measurements at one sitting and record the average.
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
## 2018-01-30 ENCOUNTER — Encounter: Payer: Self-pay | Admitting: *Deleted

## 2018-02-01 ENCOUNTER — Telehealth (HOSPITAL_COMMUNITY): Payer: Self-pay | Admitting: Cardiovascular Disease

## 2018-02-01 NOTE — Telephone Encounter (Signed)
New Message:     Patient is requesting different adhesive tape  for Holter Monitor.

## 2018-02-01 NOTE — Telephone Encounter (Signed)
Left message to call back  

## 2018-02-07 NOTE — Telephone Encounter (Signed)
Returned call to patient she stated she called monitor company and she received a new monitor.Stated she don't want to be in study.She will discuss at next appointment with [facility] 02/27/18.

## 2018-02-21 ENCOUNTER — Other Ambulatory Visit: Payer: Self-pay | Admitting: *Deleted

## 2018-02-21 MED ORDER — ALBUTEROL SULFATE (2.5 MG/3ML) 0.083% IN NEBU: 2.5000 mg | INHALATION_SOLUTION | RESPIRATORY_TRACT | 1 refills | Status: DC | PRN

## 2018-02-21 NOTE — Addendum Note (Signed)
Addended byOralia Rud M on: 02/21/2018 04:51 PM   Modules accepted: Orders

## 2018-02-21 NOTE — Telephone Encounter (Signed)
Patient wants albuterol for neb to go to [facility] in [place], [place].  Will resend rx to pharmacy per patients request.

## 2018-02-22 ENCOUNTER — Encounter: Payer: Self-pay | Admitting: Allergy

## 2018-02-22 ENCOUNTER — Ambulatory Visit (INDEPENDENT_AMBULATORY_CARE_PROVIDER_SITE_OTHER): Payer: BLUE CROSS/BLUE SHIELD | Admitting: Allergy

## 2018-02-22 VITALS — BP 110/72 | HR 91 | Temp 98.5°F | Resp 20

## 2018-02-22 DIAGNOSIS — L2084 Intrinsic (allergic) eczema: Secondary | ICD-10-CM | POA: Insufficient documentation

## 2018-02-22 DIAGNOSIS — J3089 Other allergic rhinitis: Secondary | ICD-10-CM

## 2018-02-22 DIAGNOSIS — IMO0001 Reserved for inherently not codable concepts without codable children: Secondary | ICD-10-CM

## 2018-02-22 DIAGNOSIS — J4541 Moderate persistent asthma with (acute) exacerbation: Secondary | ICD-10-CM

## 2018-02-22 DIAGNOSIS — J4521 Mild intermittent asthma with (acute) exacerbation: Secondary | ICD-10-CM | POA: Diagnosis not present

## 2018-02-22 DIAGNOSIS — L2089 Other atopic dermatitis: Secondary | ICD-10-CM | POA: Insufficient documentation

## 2018-02-22 DIAGNOSIS — J302 Other seasonal allergic rhinitis: Secondary | ICD-10-CM

## 2018-02-22 MED ORDER — LEVALBUTEROL TARTRATE 45 MCG/ACT IN AERO: INHALATION_SPRAY | RESPIRATORY_TRACT | 2 refills | Status: DC

## 2018-02-22 MED ORDER — FLUTICASONE PROPIONATE HFA 110 MCG/ACT IN AERO: 2.0000 | INHALATION_SPRAY | Freq: Two times a day (BID) | RESPIRATORY_TRACT | 2 refills | Status: DC

## 2018-02-22 NOTE — Assessment & Plan Note (Addendum)
2018 skin testing was positive to grass, weed, tree, dust mites.  Decrease Flonase to 1 spray daily as it's causing dryness.  Instead of benadryl and Claritin try to use allegra daily 180mg  (generic name is fexofenadine). The benadryl is probably making her more fatigued than normal. Patient did not tolerate zyrtec in the past.   Use saline nasal spray as needed prior to using Flonase.   Declines saline nettipot.  Continue Singulair daily.

## 2018-02-22 NOTE — Assessment & Plan Note (Deleted)
   Prednisone has been provided, 20 mg x 4 days, 10 mg x1 day, then stop.  A prescription has been provided for Flovent 110 g, 2 inhalations twice a day. To maximize pulmonary deposition, a spacer has been provided along with instructions for its proper administration with an HFA inhaler.  Continue albuterol HFA, 1-2 inhalations every 4-6 hours as needed.  The patient has been asked to contact me if her symptoms persist or progress. Otherwise, she may return for follow up in 4 months.

## 2018-02-22 NOTE — Progress Notes (Signed)
Follow Up Note  RE: Rachael TRICKEY MRN: [id number] DOB: November 18, 1955 Date of Office Visit: 02/22/2018  Referring provider: [name], DO Primary care provider: [name], DO  Chief Complaint: Asthma; Nasal Congestion; and Headache  History of Present Illness: I had the pleasure of seeing Rachael Osborn for a follow up visit at the [facility] [address] on 02/22/2018. She is a 62 y.o. female, who is being followed for asthma allergic, rhinitis, atopic dermatitis. Today she is here for new complaint of chest congestion. Her previous allergy office visit was on 01/18/2018 with Dr. Ernst Bowler.   Asthma: Patient has been having issues for the past week with headaches, nasal congestion, coughing with some yellowish phlegm, decreased energy, and wheezing sometimes. Denies any fevers. 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  Patient just finished doxycycline 100mg  BID x 7 days a few days ago with some benefit. She does not like other antibiotics as they seem to give her GI issues. She did not take prednisone with this episode yet. Prednisone usually causes some mood disorders and palpitations and she prefers not to take it.   Currently on Symbicort 160 2 puffs twice a day, Singulair daily, and using rescue inhaler at night with some benefit.  Allergic rhinitis: Currently on Flonase 2 spray daily, benadryl 2-3 times a day and loratadine once a day.   Reflux: Patient currently on omeprazole 20mg  daily.  Assessment and Plan: [name] is a 62 y.o. female with: Asthma with acute exacerbation Coughing and wheezing for the past 1 week. Finished doxycycline for URI symptoms with some benefit. Prednisone causes some side effects and declines to take it at this time.   Today's spirometry was of poor effort. Data not interpretable.   Continue Symbicort 160 2 puffs twice a day with spacer.  A prescription has been provided for Flovent 110 g, 2 inhalations twice a day to  use during upper respiratory infections for 1-2 weeks.  Albuterol causes palpitations. Sent in [place] to use.   May use xopenex rescue inhaler 2 puffs or nebulizer every 4 to 6 hours as needed for shortness of breath, chest tightness, coughing, and wheezing.  Continue Singulair daily.  Prefers not to use prednisone but if above regimen does not help she can take a short burst of prednisone if needed.  The patient has been asked to contact me if her symptoms persist or progress. Otherwise, she may return for follow up in 4 months.   Seasonal and perennial allergic rhinitis 2018 skin testing was positive to grass, weed, tree, dust mites.  Decrease Flonase to 1 spray daily as it's causing dryness.  Instead of benadryl and Claritin try to use allegra daily 180mg  (generic name is fexofenadine). The benadryl is probably making her more fatigued than normal. Patient did not tolerate zyrtec in the past.   Use saline nasal spray as needed prior to using Flonase.   Declines saline nettipot.  Continue Singulair daily.  Return in about 4 months (around 06/25/2018).  Meds ordered this encounter  Medications  . fluticasone (FLOVENT HFA) 110 MCG/ACT inhaler    Sig: Inhale 2 puffs into the lungs 2 (two) times daily. During upper respiratory infection for 1-2 weeks    Dispense:  1 Inhaler    Refill:  2  . levalbuterol (XOPENEX HFA) 45 MCG/ACT inhaler    Sig: Use 2 puffs every 4-6 hours as needed    Dispense:  1 Inhaler  Refill:  2   Diagnostics: Spirometry:  Tracings reviewed. Her effort: Poor effort, data can not be interpreted. Please see scanned spirometry results for details.  Medication List:  Current Outpatient Medications  Medication Sig Dispense Refill  . albuterol (PROVENTIL HFA;VENTOLIN HFA) 108 (90 Base) MCG/ACT inhaler Inhale 2 puffs into the lungs every 6 (six) hours as needed for wheezing or shortness of breath. 1 each 2  . albuterol (PROVENTIL) (2.5 MG/3ML) 0.083%  nebulizer solution Take 3 mLs (2.5 mg total) by nebulization every 4 (four) hours as needed for wheezing or shortness of breath. 75 mL 1  . aspirin EC 81 MG tablet Take 81 mg by mouth daily.    . baclofen (LIORESAL) 20 MG tablet baclofen 20 mg tablet  TK 1 T PO  TID PRN    . budesonide-formoterol (SYMBICORT) 160-4.5 MCG/ACT inhaler 2 puffs into the lungs twice a day to prevent cough and wheeze Rinse gargle and spit after use. 1 Inhaler 5  . clotrimazole-betamethasone (LOTRISONE) cream clotrimazole-betamethasone 1 %-0.05 % topical cream    . diltiazem (CARDIZEM LA) 180 MG 24 hr tablet Take 120 mg by mouth daily.   7  . diphenhydrAMINE (BENADRYL) 25 mg capsule Take 25 mg by mouth every 6 (six) hours as needed for allergies.    . fluticasone (FLONASE) 50 MCG/ACT nasal spray Place 2 sprays into both nostrils daily. 16 g 5  . gabapentin (NEURONTIN) 300 MG capsule Take 300 mg by mouth 3 (three) times daily.    . hydrochlorothiazide (HYDRODIURIL) 25 MG tablet Take 25 mg by mouth daily.     [name] loratadine (CLARITIN) 10 MG tablet Take 10 mg by mouth daily.    . meloxicam (MOBIC) 15 MG tablet Take 15 mg by mouth as needed.     . montelukast (SINGULAIR) 10 MG tablet 1 tablet by mouth once a day for coughing or wheezing. 34 tablet 5  . Multiple Vitamin (MULTIVITAMIN WITH MINERALS) TABS tablet Take 1 tablet by mouth daily.    [name] omeprazole (PRILOSEC) 20 MG capsule Take 1 capsule (20 mg total) by mouth daily. 30 capsule 5  . orphenadrine (NORFLEX) 100 MG tablet Take 1 tablet (100 mg total) by mouth 2 (two) times daily. (Patient taking differently: Take 100 mg by mouth 2 (two) times daily as needed. ) 30 tablet 0  . sertraline (ZOLOFT) 25 MG tablet   1  . triamcinolone cream (KENALOG) 0.1 % triamcinolone acetonide 0.1 % topical cream    . TURMERIC PO Take 2 capsules by mouth daily.    . valsartan (DIOVAN) 80 MG tablet Take by mouth.    . fluticasone (FLOVENT HFA) 110 MCG/ACT inhaler Inhale 2 puffs into the lungs  2 (two) times daily. During upper respiratory infection for 1-2 weeks 1 Inhaler 2  . levalbuterol (XOPENEX HFA) 45 MCG/ACT inhaler Use 2 puffs every 4-6 hours as needed 1 Inhaler 2  . losartan (COZAAR) 25 MG tablet   3   Current Facility-Administered Medications  Medication Dose Route Frequency Provider Last Rate Last Dose  . predniSONE (DELTASONE) tablet 10 mg  10 mg Oral Q breakfast Bobbitt, [place] Muta, MD       Allergies: Allergies  Allergen Reactions  . Robaxin [Methocarbamol] Rash  . Zyrtec [Cetirizine] Itching   I reviewed her past medical history, social history, family history, and environmental history and no significant changes have been reported from previous visit on 01/18/2018.  Review of Systems  Constitutional: Positive for fatigue. Negative for appetite change,  chills, fever and unexpected weight change.  HENT: Positive for congestion, postnasal drip and rhinorrhea.   Eyes: Negative for itching.  Respiratory: Positive for cough and wheezing. Negative for chest tightness and shortness of breath.   Gastrointestinal: Negative for abdominal pain.  Skin: Negative for rash.  Allergic/Immunologic: Positive for environmental allergies.  Neurological: Negative for headaches.   Objective: BP 110/72   Pulse 91   Temp 98.5 F (36.9 C) (Oral)   Resp 20   SpO2 94%  There is no height or weight on file to calculate BMI. Physical Exam  Constitutional: She is oriented to person, place, and time. She appears well-developed and well-nourished.  HENT:  Head: Normocephalic and atraumatic.  Right Ear: External ear normal.  Left Ear: External ear normal.  Nose: Nose normal.  Mouth/Throat: Oropharynx is clear and moist.  Eyes: Conjunctivae and EOM are normal.  Neck: Neck supple.  Cardiovascular: Normal rate, regular rhythm and normal heart sounds. Exam reveals no gallop and no friction rub.  No murmur heard. Pulmonary/Chest: Effort normal and breath sounds normal. She has no  wheezes. She has no rales.  Lymphadenopathy:    She has no cervical adenopathy.  Neurological: She is alert and oriented to person, place, and time.  Skin: Skin is warm. No rash noted.  Psychiatric: She has a normal mood and affect. Her behavior is normal.  Nursing note and vitals reviewed.  Previous notes and tests were reviewed. The plan was reviewed with the patient/family, and all questions/concerned were addressed.  It was my pleasure to see Donetta today and participate in her care. Please feel free to contact me with any questions or concerns.  Sincerely,  Rexene Alberts, DO Allergy & Immunology  [facility] office: [phone] [place]

## 2018-02-22 NOTE — Patient Instructions (Addendum)
Asthma with acute exacerbation Coughing and wheezing for the past 1 week. Finished doxycycline for URI symptoms with some benefit. Prednisone causes some side effects and declines to take it at this time.   Today's spirometry was of poor effort. Data not interpretable.   Continue Symbicort 160 2 puffs twice a day with spacer.  A prescription has been provided for Flovent 110 g, 2 inhalations twice a day to use during upper respiratory infections for 1-2 weeks.  Albuterol causes palpitations. Sent in [place] to use.   May use xopenex rescue inhaler 2 puffs or nebulizer every 4 to 6 hours as needed for shortness of breath, chest tightness, coughing, and wheezing.  Continue Singulair daily.  Prefers not to use prednisone but if above regimen does not help she can take a short burst of prednisone if needed.  The patient has been asked to contact me if her symptoms persist or progress. Otherwise, she may return for follow up in 4 months.   Seasonal and perennial allergic rhinitis 2018 skin testing was positive to grass, weed, tree, dust mites.  Decrease Flonase to 1 spray daily as it's causing dryness.  Instead of benadryl and Claritin try to use allegra daily 180mg  (generic name is fexofenadine). The benadryl is probably making her more fatigued than normal. Patient did not tolerate zyrtec in the past.   Use saline nasal spray as needed prior to using Flonase.   Declines saline nettipot.  Continue Singulair daily.  Return in about 4 months (around 06/25/2018).

## 2018-02-22 NOTE — Assessment & Plan Note (Addendum)
Coughing and wheezing for the past 1 week. Finished doxycycline for URI symptoms with some benefit. Prednisone causes some side effects and declines to take it at this time.   Today's spirometry was of poor effort. Data not interpretable.   Continue Symbicort 160 2 puffs twice a day with spacer.  A prescription has been provided for Flovent 110 g, 2 inhalations twice a day to use during upper respiratory infections for 1-2 weeks.  Albuterol causes palpitations. Sent in [name] to use.   May use xopenex rescue inhaler 2 puffs or nebulizer every 4 to 6 hours as needed for shortness of breath, chest tightness, coughing, and wheezing.  Continue Singulair daily.  Prefers not to use prednisone but if above regimen does not help she can take a short burst of prednisone if needed.  The patient has been asked to contact me if her symptoms persist or progress. Otherwise, she may return for follow up in 4 months.

## 2018-02-27 ENCOUNTER — Encounter: Payer: Self-pay | Admitting: Cardiovascular Disease

## 2018-02-27 ENCOUNTER — Ambulatory Visit (INDEPENDENT_AMBULATORY_CARE_PROVIDER_SITE_OTHER): Payer: BLUE CROSS/BLUE SHIELD | Admitting: Cardiovascular Disease

## 2018-02-27 DIAGNOSIS — I10 Essential (primary) hypertension: Secondary | ICD-10-CM

## 2018-02-27 DIAGNOSIS — G4733 Obstructive sleep apnea (adult) (pediatric): Secondary | ICD-10-CM

## 2018-02-27 DIAGNOSIS — R002 Palpitations: Secondary | ICD-10-CM | POA: Diagnosis not present

## 2018-02-27 LAB — HEPATIC FUNCTION PANEL
ALK PHOS: 101 IU/L (ref 39–117)
ALT: 46 IU/L — AB (ref 0–32)
AST: 30 IU/L (ref 0–40)
Albumin: 4.7 g/dL (ref 3.6–4.8)
BILIRUBIN TOTAL: 0.5 mg/dL (ref 0.0–1.2)
Bilirubin, Direct: 0.14 mg/dL (ref 0.00–0.40)
Total Protein: 7.2 g/dL (ref 6.0–8.5)

## 2018-02-27 LAB — LIPID PANEL
Chol/HDL Ratio: 3.9 ratio (ref 0.0–4.4)
Cholesterol, Total: 212 mg/dL — ABNORMAL HIGH (ref 100–199)
HDL: 54 mg/dL (ref >39)
LDL Calculated: 140 mg/dL — ABNORMAL HIGH (ref 0–99)
TRIGLYCERIDES: 92 mg/dL (ref 0–149)
VLDL Cholesterol Cal: 18 mg/dL (ref 5–40)

## 2018-02-27 NOTE — Patient Instructions (Signed)
Medication Instructions:  Your physician recommends that you continue on your current medications as directed. Please refer to the Current Medication list given to you today.  If you need a refill on your cardiac medications before your next appointment, please call your pharmacy.   Lab work: Your physician recommends that you return for lab work in: TODAY  If you have labs (blood work) drawn today and your tests are completely normal, you will receive your results only by: [name] MyChart Message (if you have MyChart) OR . A paper copy in the mail If you have any lab test that is abnormal or we need to change your treatment, we will call you to review the results.  Testing/Procedures: none  Follow-Up: At [facility], you and your health needs are our priority.  As part of our continuing mission to provide you with exceptional heart care, we have created designated Provider Care Teams.  These Care Teams include your primary Cardiologist (physician) and Advanced Practice Providers (APPs -  Physician Assistants and Nurse Practitioners) who all work together to provide you with the care you need, when you need it. You will need a follow up appointment in 12 months.  Please call our office 2 months in advance to schedule this appointment.  You may see Dr. Gwenlyn Found or one of the following Advanced Practice Providers on your designated Care Team:   Kerin Ransom, PA-C Roby Lofts, [place] . Sande Rives, PA-C  Any Other Special Instructions Will Be Listed Below (If Applicable).

## 2018-02-27 NOTE — Assessment & Plan Note (Signed)
History of atypical chest pain with recent Myoview stress test performed 01/24/2018 which was entirely normal.  I suspect her chest pain is related to reflux.  She is on an H1 blocker.  If her pain persists I suggested that her primary care physician refer her to a gastroenterologist.

## 2018-02-27 NOTE — Assessment & Plan Note (Signed)
History of essential hypertension her blood pressure measured today at 118/76.  She is on diltiazem, losartan and hydrochlorothiazide.  She has a blood pressure log which I reviewed which for the most part shows acceptable blood pressure readings and today her blood pressure is 118/76.  Continue current meds at current dosing.

## 2018-02-27 NOTE — Assessment & Plan Note (Signed)
Symptoms compatible with obstructive sleep apnea and scheduled to have an outpatient sleep study in the near future.

## 2018-02-27 NOTE — Assessment & Plan Note (Signed)
History of PSVT ablation by Dr. Ola Spurr in [facility] March 2018 with symptomatic episodes prior to that.  She did complain of intermittent palpitations since that time although an event monitor recently performed in our office only showed sinus rhythm, sinus bradycardia and sinus tachycardia.

## 2018-02-27 NOTE — Progress Notes (Signed)
02/27/2018 Rachael Osborn   06/16/1955  [id number]  Primary Physician [name], DO Primary Cardiologist: Lorretta Harp MD [name], [place]  HPI:  Rachael Osborn is a 62 y.o.  moderately overweight married African-American female mother of 1 child, grandmother of 1 step grandchild referred by Dr. Duwaine Maxin for cardiovascular evaluation because of hypertension and palpitations.    I last saw her in the office 01/15/2018.  She works as a Programme researcher, broadcasting/film/video at [employer].  She was the [employer] for 11 years.  She has an MBA in a PhD in education.  She has expressed a desire to go on to get a law degree and to teach at [employer]. Her risk factors include treated hypertension.  She is never had a heart attack or stroke.  She she does complain of some chest pain and left upper extremity intermittent numbness.  She also has reactive airways disease.  She is scheduled to have a sleep study in the upcoming future.  She does not drink caffeine or other stimulants.  She did have SVT ablation by Dr. Ola Spurr in [facility] in March of this year with several episodes of symptomatic SVT prior to that.  She is on diltiazem.  She does complain of intermittent palpitations as well.  Since I saw her several months ago she did have a Myoview stress test which was entirely normal on 01/24/2018.  Her symptoms of chest pain sound more like reflux.  An event monitor showed sinus rhythm/sinus bradycardia and tachycardia but no evidence of PSVT.  She is scheduled to have an outpatient sleep study in the upcoming future.  Current Meds  Medication Sig  . albuterol (PROVENTIL HFA;VENTOLIN HFA) 108 (90 Base) MCG/ACT inhaler Inhale 2 puffs into the lungs every 6 (six) hours as needed for wheezing or shortness of breath.  [name] albuterol (PROVENTIL) (2.5 MG/3ML) 0.083% nebulizer solution Take 3 mLs (2.5 mg total) by nebulization every 4 (four) hours as needed for wheezing or shortness  of breath.  [name] aspirin EC 81 MG tablet Take 81 mg by mouth daily.  . baclofen (LIORESAL) 20 MG tablet baclofen 20 mg tablet  TK 1 T PO  TID PRN  . budesonide-formoterol (SYMBICORT) 160-4.5 MCG/ACT inhaler 2 puffs into the lungs twice a day to prevent cough and wheeze Rinse gargle and spit after use.  . clotrimazole-betamethasone (LOTRISONE) cream clotrimazole-betamethasone 1 %-0.05 % topical cream  . diltiazem (CARDIZEM LA) 180 MG 24 hr tablet Take 120 mg by mouth daily.   . diphenhydrAMINE (BENADRYL) 25 mg capsule Take 25 mg by mouth every 6 (six) hours as needed for allergies.  . fluticasone (FLONASE) 50 MCG/ACT nasal spray Place 2 sprays into both nostrils daily.  . fluticasone (FLOVENT HFA) 110 MCG/ACT inhaler Inhale 2 puffs into the lungs 2 (two) times daily. During upper respiratory infection for 1-2 weeks  . gabapentin (NEURONTIN) 300 MG capsule Take 300 mg by mouth 3 (three) times daily.  . hydrochlorothiazide (HYDRODIURIL) 25 MG tablet Take 25 mg by mouth daily.   [name] levalbuterol (XOPENEX HFA) 45 MCG/ACT inhaler Use 2 puffs every 4-6 hours as needed  . loratadine (CLARITIN) 10 MG tablet Take 10 mg by mouth daily.  [name] losartan (COZAAR) 25 MG tablet   . meloxicam (MOBIC) 15 MG tablet Take 15 mg by mouth as needed.   . montelukast (SINGULAIR) 10 MG tablet 1 tablet by mouth once a day for coughing or wheezing.  [name]  Multiple Vitamin (MULTIVITAMIN WITH MINERALS) TABS tablet Take 1 tablet by mouth daily.  [name] omeprazole (PRILOSEC) 20 MG capsule Take 1 capsule (20 mg total) by mouth daily.  . orphenadrine (NORFLEX) 100 MG tablet Take 1 tablet (100 mg total) by mouth 2 (two) times daily. (Patient taking differently: Take 100 mg by mouth 2 (two) times daily as needed. )  . sertraline (ZOLOFT) 25 MG tablet   . triamcinolone cream (KENALOG) 0.1 % triamcinolone acetonide 0.1 % topical cream  . TURMERIC PO Take 2 capsules by mouth daily.  . valsartan (DIOVAN) 80 MG tablet Take by mouth.   Current  Facility-Administered Medications for the 02/27/18 encounter (Office Visit) with Lorretta Harp, MD  Medication  . predniSONE (DELTASONE) tablet 10 mg     Allergies  Allergen Reactions  . Robaxin [Methocarbamol] Rash  . Zyrtec [Cetirizine] Itching    Social History   Socioeconomic History  . Marital status: Married    Spouse name: Not on file  . Number of children: Not on file  . Years of education: Not on file  . Highest education level: Not on file  Occupational History  . Not on file  Social Needs  . Financial resource strain: Not on file  . Food insecurity:    Worry: Not on file    Inability: Not on file  . Transportation needs:    Medical: Not on file    Non-medical: Not on file  Tobacco Use  . Smoking status: Never Smoker  . Smokeless tobacco: Never Used  Substance and Sexual Activity  . Alcohol use: Yes    Comment: socially  . Drug use: No  . Sexual activity: Not on file  Lifestyle  . Physical activity:    Days per week: Not on file    Minutes per session: Not on file  . Stress: Not on file  Relationships  . Social connections:    Talks on phone: Not on file    Gets together: Not on file    Attends religious service: Not on file    Active member of club or organization: Not on file    Attends meetings of clubs or organizations: Not on file    Relationship status: Not on file  . Intimate partner violence:    Fear of current or ex partner: Not on file    Emotionally abused: Not on file    Physically abused: Not on file    Forced sexual activity: Not on file  Other Topics Concern  . Not on file  Social History Narrative  . Not on file     Review of Systems: General: negative for chills, fever, night sweats or weight changes.  Cardiovascular: negative for chest pain, dyspnea on exertion, edema, orthopnea, palpitations, paroxysmal nocturnal dyspnea or shortness of breath Dermatological: negative for rash Respiratory: negative for cough or  wheezing Urologic: negative for hematuria Abdominal: negative for nausea, vomiting, diarrhea, bright red blood per rectum, melena, or hematemesis Neurologic: negative for visual changes, syncope, or dizziness All other systems reviewed and are otherwise negative except as noted above.    Blood pressure 118/76, pulse 93, height 5\' 6"  (1.676 m), weight 179 lb (81.2 kg).  General appearance: alert and no distress Neck: no adenopathy, no carotid bruit, no JVD, supple, symmetrical, trachea midline and thyroid not enlarged, symmetric, no tenderness/mass/nodules Lungs: clear to auscultation bilaterally Heart: regular rate and rhythm, S1, S2 normal, no murmur, click, rub or gallop Extremities: extremities normal, atraumatic, no cyanosis or  edema Pulses: 2+ and symmetric Skin: Skin color, texture, turgor normal. No rashes or lesions Neurologic: Alert and oriented X 3, normal strength and tone. Normal symmetric reflexes. Normal coordination and gait  EKG not performed today  ASSESSMENT AND PLAN:   Essential hypertension History of essential hypertension her blood pressure measured today at 118/76.  She is on diltiazem, losartan and hydrochlorothiazide.  She has a blood pressure log which I reviewed which for the most part shows acceptable blood pressure readings and today her blood pressure is 118/76.  Continue current meds at current dosing.  Palpitations History of PSVT ablation by Dr. Ola Spurr in [facility] March 2018 with symptomatic episodes prior to that.  She did complain of intermittent palpitations since that time although an event monitor recently performed in our office only showed sinus rhythm, sinus bradycardia and sinus tachycardia.  Chest pain History of atypical chest pain with recent Myoview stress test performed 01/24/2018 which was entirely normal.  I suspect her chest pain is related to reflux.  She is on an H1 blocker.  If her pain persists I suggested that her primary care  physician refer her to a gastroenterologist.  Obstructive sleep apnea Symptoms compatible with obstructive sleep apnea and scheduled to have an outpatient sleep study in the near future.      Lorretta Harp MD FACP,FACC,FAHA, [facility] 02/27/2018 8:42 AM

## 2018-03-05 ENCOUNTER — Telehealth: Payer: Self-pay

## 2018-03-05 DIAGNOSIS — E785 Hyperlipidemia, unspecified: Secondary | ICD-10-CM

## 2018-03-05 NOTE — Telephone Encounter (Signed)
Pt aware of lab results. Pt sts that she has taken lipitor in the past and was not able to tolerate due to leg cramping. Pt would like Dr.Berry to recommend another medication. Pt sts that when calling back it is ok to leave a detailed on her mobile phone.  Message has been fwd to [facility] [address]

## 2018-03-05 NOTE — Telephone Encounter (Signed)
-----   Message from Lorretta Harp, MD sent at 03/03/2018  4:28 PM EST ----- Start lipitor 20 mg and re check 2 months

## 2018-03-08 MED ORDER — ROSUVASTATIN CALCIUM 20 MG PO TABS: 20.0000 mg | ORAL_TABLET | Freq: Every day | ORAL | 3 refills | Status: DC

## 2018-03-08 NOTE — Telephone Encounter (Signed)
Detailed message left on the pt voicemail as requested. Rx for Crestor 20mg  daily #90 R-3  has been sent to the pt pharmacy [facility]. Pt is return to our office for fasting lipid and lft orders in Epic. Pt is to call the office if any questions or concerns.

## 2018-03-08 NOTE — Telephone Encounter (Signed)
-----   Message from Pixie Casino, MD sent at 03/07/2018  8:50 PM EST ----- Could try Crestor 20 mg daily.  Dr. Debara Pickett (covering for Dr. Gwenlyn Found)

## 2018-07-15 ENCOUNTER — Other Ambulatory Visit: Payer: Self-pay

## 2018-07-15 MED ORDER — SYMBICORT 160-4.5 MCG/ACT IN AERO: INHALATION_SPRAY | RESPIRATORY_TRACT | 0 refills | Status: DC

## 2018-07-15 MED ORDER — MONTELUKAST SODIUM 10 MG PO TABS: ORAL_TABLET | ORAL | 0 refills | Status: DC

## 2018-07-16 ENCOUNTER — Other Ambulatory Visit: Payer: Self-pay

## 2018-07-19 ENCOUNTER — Telehealth: Payer: Self-pay | Admitting: Allergy & Immunology

## 2018-07-19 MED ORDER — SYMBICORT 160-4.5 MCG/ACT IN AERO: INHALATION_SPRAY | RESPIRATORY_TRACT | 0 refills | Status: DC

## 2018-07-19 MED ORDER — OMEPRAZOLE 20 MG PO CPDR: 20.0000 mg | DELAYED_RELEASE_CAPSULE | Freq: Every day | ORAL | 0 refills | Status: DC

## 2018-07-19 MED ORDER — LEVALBUTEROL TARTRATE 45 MCG/ACT IN AERO: INHALATION_SPRAY | RESPIRATORY_TRACT | 0 refills | Status: DC

## 2018-07-19 MED ORDER — FLUTICASONE PROPIONATE 50 MCG/ACT NA SUSP: 2.0000 | Freq: Every day | NASAL | 0 refills | Status: DC

## 2018-07-19 NOTE — Telephone Encounter (Signed)
PT request refill for prilosec, flonase, symbicort, [facility]

## 2018-07-19 NOTE — Telephone Encounter (Signed)
Prescription refills have been sent in.

## 2018-07-23 ENCOUNTER — Other Ambulatory Visit: Payer: Self-pay | Admitting: Cardiovascular Disease

## 2018-07-23 DIAGNOSIS — E785 Hyperlipidemia, unspecified: Secondary | ICD-10-CM

## 2018-07-23 NOTE — Telephone Encounter (Signed)
Donita from [facility] was calling to get an e-script or a verbal rx for the Pt. The current rx is expired. The patient requested a 90 day supply with 3 refills    *STAT* If patient is at the pharmacy, call can be transferred to refill team.   1. Which medications need to be refilled? (please list name of each medication and dose if known)  rosuvastatin (CRESTOR) 20 MG tablet(Expired)  2. Which pharmacy/location (including street and city if local pharmacy) is medication to be sent to? [facility] [place], [place]  3. Do they need a 30 day or 90 day supply? [place]

## 2018-07-24 ENCOUNTER — Telehealth: Payer: Self-pay

## 2018-07-24 MED ORDER — ROSUVASTATIN CALCIUM 20 MG PO TABS: 20.0000 mg | ORAL_TABLET | Freq: Every day | ORAL | 2 refills | Status: DC

## 2018-07-24 MED ORDER — BUDESONIDE-FORMOTEROL FUMARATE 160-4.5 MCG/ACT IN AERO: 2.0000 | INHALATION_SPRAY | Freq: Two times a day (BID) | RESPIRATORY_TRACT | 5 refills | Status: DC

## 2018-07-24 NOTE — Telephone Encounter (Signed)
Sent in brand only refill for symbicort

## 2018-07-24 NOTE — Telephone Encounter (Signed)
Rx(s) sent to pharmacy electronically. Patient has been notified directly and voiced understanding.  

## 2018-07-26 ENCOUNTER — Other Ambulatory Visit: Payer: Self-pay

## 2018-07-26 ENCOUNTER — Telehealth: Payer: Self-pay

## 2018-07-26 MED ORDER — LEVALBUTEROL TARTRATE 45 MCG/ACT IN AERO: INHALATION_SPRAY | RESPIRATORY_TRACT | 0 refills | Status: DC

## 2018-07-26 MED ORDER — OMEPRAZOLE 20 MG PO CPDR: 20.0000 mg | DELAYED_RELEASE_CAPSULE | Freq: Every day | ORAL | 0 refills | Status: DC

## 2018-07-26 NOTE — Telephone Encounter (Signed)
Pt was addiment about not having albuterol. She wanted me to try the local [facility] and se eif they would approve it. I informed her it was an insurance denial not the pharmacy but she said you don't know that and to [facility]. I sent it in and we will see if it is approved.

## 2018-07-26 NOTE — Telephone Encounter (Signed)
Yes okay to switch. Please call patient and let her know of the switch. If she gets palpitations from using the albuterol let us know then we can get PA.

## 2018-07-26 NOTE — Telephone Encounter (Signed)
[facility] called and stated that xopenex hfa was not covered but albuterol ventolin was covered, is it ok to switch?

## 2018-09-25 ENCOUNTER — Other Ambulatory Visit: Payer: Self-pay | Admitting: Allergy & Immunology

## 2018-10-02 ENCOUNTER — Telehealth: Payer: Self-pay | Admitting: *Deleted

## 2018-10-02 NOTE — Telephone Encounter (Signed)
PA approved for Levalbuterol 45 mcg.

## 2018-10-11 ENCOUNTER — Other Ambulatory Visit: Payer: Self-pay

## 2018-10-11 ENCOUNTER — Ambulatory Visit (INDEPENDENT_AMBULATORY_CARE_PROVIDER_SITE_OTHER): Payer: BC Managed Care – PPO | Admitting: Family Medicine

## 2018-10-11 ENCOUNTER — Encounter: Payer: Self-pay | Admitting: Family Medicine

## 2018-10-11 VITALS — BP 102/62 | HR 92 | Temp 98.1°F | Resp 16 | Ht 64.17 in | Wt 181.0 lb

## 2018-10-11 DIAGNOSIS — L2084 Intrinsic (allergic) eczema: Secondary | ICD-10-CM | POA: Diagnosis not present

## 2018-10-11 DIAGNOSIS — J4521 Mild intermittent asthma with (acute) exacerbation: Secondary | ICD-10-CM | POA: Diagnosis not present

## 2018-10-11 DIAGNOSIS — J301 Allergic rhinitis due to pollen: Secondary | ICD-10-CM

## 2018-10-11 DIAGNOSIS — L508 Other urticaria: Secondary | ICD-10-CM

## 2018-10-11 DIAGNOSIS — I10 Essential (primary) hypertension: Secondary | ICD-10-CM

## 2018-10-11 DIAGNOSIS — IMO0001 Reserved for inherently not codable concepts without codable children: Secondary | ICD-10-CM

## 2018-10-11 MED ORDER — MONTELUKAST SODIUM 10 MG PO TABS: ORAL_TABLET | ORAL | 3 refills | Status: DC

## 2018-10-11 MED ORDER — LORATADINE 10 MG PO TABS: 10.0000 mg | ORAL_TABLET | Freq: Two times a day (BID) | ORAL | 5 refills | Status: DC | PRN

## 2018-10-11 MED ORDER — FAMOTIDINE 20 MG PO TABS: 20.0000 mg | ORAL_TABLET | Freq: Two times a day (BID) | ORAL | 5 refills | Status: DC

## 2018-10-11 MED ORDER — EUCRISA 2 % EX OINT: 1.0000 "application " | TOPICAL_OINTMENT | Freq: Two times a day (BID) | CUTANEOUS | 3 refills | Status: DC

## 2018-10-11 MED ORDER — LEVALBUTEROL TARTRATE 45 MCG/ACT IN AERO: INHALATION_SPRAY | RESPIRATORY_TRACT | 1 refills | Status: DC

## 2018-10-11 MED ORDER — BUDESONIDE-FORMOTEROL FUMARATE 160-4.5 MCG/ACT IN AERO: 2.0000 | INHALATION_SPRAY | Freq: Two times a day (BID) | RESPIRATORY_TRACT | 5 refills | Status: DC

## 2018-10-11 MED ORDER — FLOVENT HFA 110 MCG/ACT IN AERO: 2.0000 | INHALATION_SPRAY | Freq: Two times a day (BID) | RESPIRATORY_TRACT | 1 refills | Status: DC

## 2018-10-11 NOTE — Patient Instructions (Addendum)
Moderate persistent asthma without complication Continue Symbicort 160-2 puffs twice a day with a spacer to prevent cough or wheeze For now, and for asthma flares, begin Flovent 110-2 puffs twice a day with a spacer for 2 weeks or until cough and wheeze free Continue Xopenex 2 puffs every 4-6 hours as needed for cough or wheeze  Intrinsic atopic dermatitis Continue a daily moisturizing routine as you have been Stop triamcinolone cream and begin triamcinolone 0.1% ointment twice a day to red itchy areas below your face. You may also use Eucrisa twice a day as needed.   Seasonal allergic rhinitis due to pollen Continue Nasocort 1 spray in each nostril once a day as needed for a stuffy nose Continue Claritin 10 mg once a day as needed for a runny nose or itch Consider nasal saline rinses as needed for nasal symptoms (NeilMed recommended) Begin saline gel as needed for dry nostrils  Chronic urticaria Continue Claritin 1-2 tablets daily (as above) Begin famotidine 20 mg twice a day. This will help with itch and heartburn Continue montelukast 10 mg once a day as above  Call the clinic if this treatment plan is not working well for you  Follow up in 2 months or sooner if needed

## 2018-10-11 NOTE — Progress Notes (Signed)
[place] [id number] Dept: [phone]  FOLLOW UP NOTE  Patient ID: Rachael Osborn, female    DOB: 11/14/1955  Age: 63 y.o. MRN: [id number] Date of Office Visit: 10/11/2018  Assessment  Chief Complaint: Asthma  HPI AMRAN MALTER is a 63 year old female who presents to the clinic for an acute visit. She was last seen in this clinic on 02/23/2019 by Dr. Maudie Mercury for evaluation of asthma, allergic rhinitis, atopic dermatitis, and chest congestion. At today's visit, she reports her asthma has not been well controlled with shortness of breath and wheeze on some nights for the last 2 weeks. She is currently taking Symbicort 160-2 puffs twice a day with a spacer, montelukast 10 mg once a day, and not using a rescue inhaler as she does not have any Xopenex at this time. She has not used Flovent 110 at this time. Allergic rhinitis is reported as moderately well controlled with symptoms including nasal congestion and dry nostrils. She continues Nasocort as needed and Claritin daily. She is not using a nasal saline rinse or saline gel. Reflux is reported as poorly controlled with daily heartburn regardless of foods eaten. She recently increased omeprazole to 20 mg twice a day at the suggestion of her PCP with mild relief of hreatburn. Tasnia reports that she has been experiencing a raised pruritic rash that began on her neck and has moved to her arms. She is currently using a daily moisturizer and triamcinolone 0.1% cream which initially provided relief, however, has not been effective as of the last 2-3 weeks. She is not experiencing cardiopulmonary or gastrointestinal symptoms with the rash. Her current medications are listed in the chart.    Drug Allergies:  Allergies  Allergen Reactions  . Robaxin [Methocarbamol] Rash  . Zyrtec [Cetirizine] Itching    Physical Exam: BP 102/62   Pulse 92   Temp 98.1 F (36.7 C) (Tympanic)   Resp 16   Ht 5' 4.17" (1.63 m)   Wt 181 lb (82.1 kg)    SpO2 98%   BMI 30.90 kg/m    Physical Exam Vitals signs reviewed.  Constitutional:      Appearance: Normal appearance.  HENT:     Head: Normocephalic and atraumatic.     Right Ear: Tympanic membrane normal.     Left Ear: Tympanic membrane normal.     Nose:     Comments: Bilateral nares pale and edematous with no nasal drainage noted. Pharynx normal. Ears normal. Eyes normal.    Mouth/Throat:     Pharynx: Oropharynx is clear.  Eyes:     Conjunctiva/sclera: Conjunctivae normal.  Neck:     Musculoskeletal: Normal range of motion and neck supple.  Cardiovascular:     Rate and Rhythm: Normal rate and regular rhythm.     Heart sounds: Normal heart sounds. No murmur.  Pulmonary:     Effort: Pulmonary effort is normal.     Breath sounds: Normal breath sounds.     Comments: Lungs clear to auscultation Musculoskeletal: Normal range of motion.  Skin:    General: Skin is warm.     Comments: Individual raised areas on the back of her neck and bilateral arms.   Neurological:     Mental Status: She is alert and oriented to person, place, and time.  Psychiatric:        Mood and Affect: Mood normal.        Behavior: Behavior normal.  Thought Content: Thought content normal.        Judgment: Judgment normal.     Diagnostics: FVC 2.74, FEV1 2.31. Predicted FVC 2.57, predicted FEV1 2.01. Spirometry is within the normal range.   Assessment and Plan: 1. Moderate intermittent asthma with acute exacerbation   2. Intrinsic atopic dermatitis   3. Seasonal allergic rhinitis due to pollen   4. Essential hypertension   5. Chronic urticaria     Meds ordered this encounter  Medications  . budesonide-formoterol (SYMBICORT) 160-4.5 MCG/ACT inhaler    Sig: Inhale 2 puffs into the lungs 2 (two) times daily.    Dispense:  1 Inhaler    Refill:  5    Dispense brand only  . fluticasone (FLOVENT HFA) 110 MCG/ACT inhaler    Sig: Inhale 2 puffs into the lungs 2 (two) times daily. During  upper respiratory infection for 1-2 weeks    Dispense:  1 Inhaler    Refill:  1  . montelukast (SINGULAIR) 10 MG tablet    Sig: Take one tablet by mouth once a day for coughing or wheezing.    Dispense:  90 tablet    Refill:  3    Please inform patient that she needs appointment. Thank you- HMK  . levalbuterol (XOPENEX HFA) 45 MCG/ACT inhaler    Sig: Use 2 puffs every 4-6 hours as needed    Dispense:  3 Inhaler    Refill:  1    Patient must have office visit for further refills.  Stasia Cavalier (EUCRISA) 2 % OINT    Sig: Apply 1 application topically 2 (two) times daily.    Dispense:  60 g    Refill:  3  . famotidine (PEPCID) 20 MG tablet    Sig: Take 1 tablet (20 mg total) by mouth 2 (two) times daily.    Dispense:  60 tablet    Refill:  5  . loratadine (CLARITIN) 10 MG tablet    Sig: Take 1 tablet (10 mg total) by mouth 2 (two) times daily as needed for allergies.    Dispense:  60 tablet    Refill:  5    Patient Instructions  Moderate persistent asthma without complication Continue Symbicort 160-2 puffs twice a day with a spacer to prevent cough or wheeze For now, and for asthma flares, begin Flovent 110-2 puffs twice a day with a spacer for 2 weeks or until cough and wheeze free Continue Xopenex 2 puffs every 4-6 hours as needed for cough or wheeze  Intrinsic atopic dermatitis Continue a daily moisturizing routine as you have been Stop triamcinolone cream and begin triamcinolone 0.1% ointment twice a day to red itchy areas below your face. You may also use Eucrisa twice a day as needed.   Seasonal allergic rhinitis due to pollen Continue Nasocort 1 spray in each nostril once a day as needed for a stuffy nose Continue Claritin 10 mg once a day as needed for a runny nose or itch Consider nasal saline rinses as needed for nasal symptoms (NeilMed recommended) Begin saline gel as needed for dry nostrils  Chronic urticaria Continue Claritin 1-2 tablets daily (as above) Begin  famotidine 20 mg twice a day. This will help with itch and heartburn Continue montelukast 10 mg once a day as above  Call the clinic if this treatment plan is not working well for you  Follow up in 2 months or sooner if needed   Return in about 2 months (around 12/11/2018), or if symptoms  worsen or fail to improve.    Thank you for the opportunity to care for this patient.  Please do not hesitate to contact me with questions.  Gareth Morgan, FNP [facility]

## 2018-12-08 ENCOUNTER — Other Ambulatory Visit: Payer: Self-pay | Admitting: Family Medicine

## 2019-01-17 ENCOUNTER — Telehealth: Payer: Self-pay | Admitting: Cardiovascular Disease

## 2019-01-17 NOTE — Telephone Encounter (Signed)
New Message    Pt c/o medication issue:  1. Name of Medication: Valsartan 80mg   2. How are you currently taking this medication (dosage and times per day)? Take by mouth  3. Are you having a reaction (difficulty breathing--STAT)? No  4. What is your medication issue? Patient wants to know if she could increase medication.  States he blood pressure has still ben going up.

## 2019-01-17 NOTE — Telephone Encounter (Signed)
Reports average BP 130/87 for past week and has also recently returned to work. Reports not being able to walk/exercise since that time. Reports slight headache all week and has been taking tylenol which improves headache. Denies chest pain, sob or or dizziness. Medications reviewed. Advised to continue monitoring BP with consistency. Bring readings to up coming visit and contact our office if BP is staying elevated. Verbalized understanding of plan.

## 2019-02-12 ENCOUNTER — Ambulatory Visit (INDEPENDENT_AMBULATORY_CARE_PROVIDER_SITE_OTHER): Payer: BC Managed Care – PPO | Admitting: Cardiovascular Disease

## 2019-02-12 ENCOUNTER — Other Ambulatory Visit: Payer: Self-pay

## 2019-02-12 ENCOUNTER — Encounter

## 2019-02-12 ENCOUNTER — Encounter: Payer: Self-pay | Admitting: Cardiovascular Disease

## 2019-02-12 DIAGNOSIS — R0789 Other chest pain: Secondary | ICD-10-CM | POA: Diagnosis not present

## 2019-02-12 DIAGNOSIS — I10 Essential (primary) hypertension: Secondary | ICD-10-CM | POA: Diagnosis not present

## 2019-02-12 DIAGNOSIS — R002 Palpitations: Secondary | ICD-10-CM

## 2019-02-12 DIAGNOSIS — E785 Hyperlipidemia, unspecified: Secondary | ICD-10-CM | POA: Insufficient documentation

## 2019-02-12 DIAGNOSIS — E782 Mixed hyperlipidemia: Secondary | ICD-10-CM | POA: Diagnosis not present

## 2019-02-12 NOTE — Progress Notes (Signed)
02/12/2019 Rachael Osborn   01-21-1956  [id number]  Primary Physician [name], DO Primary Cardiologist: Lorretta Harp MD [name], [place]  HPI:  Rachael Osborn is a 63 y.o.   moderately overweight married African-American female mother of 1 child, grandmother of 1 step grandchild referred by Dr. Duwaine Maxin for cardiovascular evaluation because of hypertension and palpitations.   I last saw her in the office 02/27/2018.  She works as a Programme researcher, broadcasting/film/video at [employer]. She was the [employer] for 11 years. She has an MBA in a PhD in education.  She has expressed a desire to go on to get a law degree and to teach at [employer].Her risk factors include treated hypertension. She is never had a heart attack or stroke. She she does complain of some chest pain and left upper extremity intermittent numbness. She also has reactive airways disease. She is scheduled to have a sleep study in the upcoming future. She does not drink caffeine or other stimulants. She did have SVT ablation by Dr. Ola Spurr in [facility] in March of this year with several episodes of symptomatic SVT prior to that. She is on diltiazem. She does complain of intermittent palpitations as well.   She did have a Myoview stress test which was entirely normal on 01/24/2018.  Her symptoms of chest pain sound more like reflux.  An event monitor showed sinus rhythm/sinus bradycardia and tachycardia but no evidence of PSVT.  She had an outpatient sleep study which apparently was unrevealing.  Since I saw her a year ago she is done well.  She is had no palpitations or chest pain other than her typical reflux symptoms.  She was seen in the ER 1 time with hypertension but for the most part her blood pressure readings at home are within normal range.  She is back on campus at [employer].   Current Meds  Medication Sig  . albuterol (PROVENTIL HFA;VENTOLIN HFA) 108 (90  Base) MCG/ACT inhaler Inhale 2 puffs into the lungs every 6 (six) hours as needed for wheezing or shortness of breath.  [name] albuterol (PROVENTIL) (2.5 MG/3ML) 0.083% nebulizer solution Take 3 mLs (2.5 mg total) by nebulization every 4 (four) hours as needed for wheezing or shortness of breath.  [name] aspirin EC 81 MG tablet Take 81 mg by mouth daily.  . budesonide-formoterol (SYMBICORT) 160-4.5 MCG/ACT inhaler Inhale 2 puffs into the lungs 2 (two) times daily.  . clotrimazole-betamethasone (LOTRISONE) cream clotrimazole-betamethasone 1 %-0.05 % topical cream  . Crisaborole (EUCRISA) 2 % OINT Apply 1 application topically 2 (two) times daily.  [name] diltiazem (CARDIZEM LA) 180 MG 24 hr tablet Take 180 mg by mouth daily.   . diphenhydrAMINE (BENADRYL) 25 mg capsule Take 25 mg by mouth every 6 (six) hours as needed for allergies.  . famotidine (PEPCID) 20 MG tablet Take 1 tablet (20 mg total) by mouth 2 (two) times daily.  [name] FLOVENT HFA 110 MCG/ACT inhaler INHALE TWO PUFFS INTO THE LUNGS TWICE DAILY DURING UPPER RESPIRATORY INFECTION FOR 1-2 WEEKS  . fluticasone (FLONASE) 50 MCG/ACT nasal spray Place 2 sprays into both nostrils daily.  [name] gabapentin (NEURONTIN) 300 MG capsule Take 400 mg by mouth 4 (four) times daily.   . hydrochlorothiazide (HYDRODIURIL) 25 MG tablet Take 25 mg by mouth daily.   [name] levalbuterol (XOPENEX HFA) 45 MCG/ACT inhaler Use 2 puffs every 4-6 hours as needed  . loratadine (CLARITIN) 10 MG  tablet Take 10 mg by mouth daily.  [name] loratadine (CLARITIN) 10 MG tablet Take 1 tablet (10 mg total) by mouth 2 (two) times daily as needed for allergies.  . meloxicam (MOBIC) 15 MG tablet Take 15 mg by mouth as needed.   . montelukast (SINGULAIR) 10 MG tablet Take one tablet by mouth once a day for coughing or wheezing.  . Multiple Vitamin (MULTIVITAMIN WITH MINERALS) TABS tablet Take 1 tablet by mouth daily.  [name] omeprazole (PRILOSEC) 20 MG capsule Take 1 capsule (20 mg total) by mouth daily.  .  rosuvastatin (CRESTOR) 20 MG tablet Take 20 mg by mouth daily.  . sertraline (ZOLOFT) 25 MG tablet   . triamcinolone cream (KENALOG) 0.1 % triamcinolone acetonide 0.1 % topical cream  . TURMERIC PO Take 2 capsules by mouth daily.  . valsartan (DIOVAN) 80 MG tablet Take by mouth.   Current Facility-Administered Medications for the 02/12/19 encounter (Office Visit) with Lorretta Harp, MD  Medication  . predniSONE (DELTASONE) tablet 10 mg     Allergies  Allergen Reactions  . Robaxin [Methocarbamol] Rash  . Zyrtec [Cetirizine] Itching    Social History   Socioeconomic History  . Marital status: Married    Spouse name: Not on file  . Number of children: Not on file  . Years of education: Not on file  . Highest education level: Not on file  Occupational History  . Not on file  Social Needs  . Financial resource strain: Not on file  . Food insecurity    Worry: Not on file    Inability: Not on file  . Transportation needs    Medical: Not on file    Non-medical: Not on file  Tobacco Use  . Smoking status: Never Smoker  . Smokeless tobacco: Never Used  Substance and Sexual Activity  . Alcohol use: Yes    Comment: socially  . Drug use: No  . Sexual activity: Not on file  Lifestyle  . Physical activity    Days per week: Not on file    Minutes per session: Not on file  . Stress: Not on file  Relationships  . Social Herbalist on phone: Not on file    Gets together: Not on file    Attends religious service: Not on file    Active member of club or organization: Not on file    Attends meetings of clubs or organizations: Not on file    Relationship status: Not on file  . Intimate partner violence    Fear of current or ex partner: Not on file    Emotionally abused: Not on file    Physically abused: Not on file    Forced sexual activity: Not on file  Other Topics Concern  . Not on file  Social History Narrative  . Not on file     Review of Systems:  General: negative for chills, fever, night sweats or weight changes.  Cardiovascular: negative for chest pain, dyspnea on exertion, edema, orthopnea, palpitations, paroxysmal nocturnal dyspnea or shortness of breath Dermatological: negative for rash Respiratory: negative for cough or wheezing Urologic: negative for hematuria Abdominal: negative for nausea, vomiting, diarrhea, bright red blood per rectum, melena, or hematemesis Neurologic: negative for visual changes, syncope, or dizziness All other systems reviewed and are otherwise negative except as noted above.    Blood pressure 117/85, pulse 85, temperature 97.7 F (36.5 C), height 5' 4.17" (1.63 m), weight 176 lb 3.2 oz (79.9 kg),  SpO2 98 %.  General appearance: alert and no distress Neck: no adenopathy, no carotid bruit, no JVD, supple, symmetrical, trachea midline and thyroid not enlarged, symmetric, no tenderness/mass/nodules Lungs: clear to auscultation bilaterally Heart: regular rate and rhythm, S1, S2 normal, no murmur, click, rub or gallop Extremities: extremities normal, atraumatic, no cyanosis or edema Pulses: 2+ and symmetric Skin: Skin color, texture, turgor normal. No rashes or lesions Neurologic: Alert and oriented X 3, normal strength and tone. Normal symmetric reflexes. Normal coordination and gait  EKG sinus rhythm at 78 with left axis deviation.  I personally reviewed this EKG.  ASSESSMENT AND PLAN:   Hyperlipidemia History of hyperlipidemia on statin therapy.  We will recheck a lipid liver profile.  Lipid profile performed 1 year ago when she was placed on statin therapy we will check cholesterol of 212, LDL 140 and HDL 54.  Essential hypertension History of essential hypertension on diltiazem, HydroDIURIL and valsartan with blood pressure measured today 117/85.  I have reviewed her blood pressure log and for the most part she is in the normal range.  Palpitations History of palpitations in the past status  post PSVT ablation by Dr. Ola Spurr at [facility] in March 2019 without recurrence.  I did do a monitor that showed sinus tachycardia, sinus bradycardia but no evidence of PSVT.  Chest pain History of atypical chest pain which sounded more like reflux with a negative Myoview stress test performed 01/24/2018  Obstructive sleep apnea Symptoms of obstructive sleep apnea with a negative home sleep study      Lorretta Harp MD [facility], [facility] 02/12/2019 3:30 PM

## 2019-02-12 NOTE — Assessment & Plan Note (Signed)
History of hyperlipidemia on statin therapy.  We will recheck a lipid liver profile.  Lipid profile performed 1 year ago when she was placed on statin therapy we will check cholesterol of 212, LDL 140 and HDL 54.

## 2019-02-12 NOTE — Assessment & Plan Note (Signed)
History of atypical chest pain which sounded more like reflux with a negative Myoview stress test performed 01/24/2018

## 2019-02-12 NOTE — Patient Instructions (Signed)
Medication Instructions:  Your physician recommends that you continue on your current medications as directed. Please refer to the Current Medication list given to you today.  If you need a refill on your cardiac medications before your next appointment, please call your pharmacy.   Lab work: Your physician recommends that you return for lab work within 1 week: Rachael Osborn  If you have labs (blood work) drawn today and your tests are completely normal, you will receive your results only by: Rachael Osborn MyChart Message (if you have MyChart) OR . A paper copy in the mail If you have any lab test that is abnormal or we need to change your treatment, we will call you to review the results.  Testing/Procedures: NONE  Follow-Up: At [facility], you and your health needs are our priority.  As part of our continuing mission to provide you with exceptional heart care, we have created designated Provider Care Teams.  These Care Teams include your primary Cardiologist (physician) and Advanced Practice Providers (APPs -  Physician Assistants and Nurse Practitioners) who all work together to provide you with the care you need, when you need it. You will need a follow up appointment in 12 months with Dr. [name].  Please call our office 2 months in advance to schedule this appointment.

## 2019-02-12 NOTE — Assessment & Plan Note (Signed)
Symptoms of obstructive sleep apnea with a negative home sleep study

## 2019-02-12 NOTE — Assessment & Plan Note (Signed)
History of essential hypertension on diltiazem, HydroDIURIL and valsartan with blood pressure measured today 117/85.  I have reviewed her blood pressure log and for the most part she is in the normal range.

## 2019-02-12 NOTE — Assessment & Plan Note (Signed)
History of palpitations in the past status post PSVT ablation by Dr. Ola Spurr at [facility] in March 2019 without recurrence.  I did do a monitor that showed sinus tachycardia, sinus bradycardia but no evidence of PSVT.

## 2019-02-18 LAB — LIPID PANEL
Chol/HDL Ratio: 2.6 ratio (ref 0.0–4.4)
Cholesterol, Total: 152 mg/dL (ref 100–199)
HDL: 59 mg/dL (ref >39)
LDL Chol Calc (NIH): 80 mg/dL (ref 0–99)
Triglycerides: 64 mg/dL (ref 0–149)
VLDL Cholesterol Cal: 13 mg/dL (ref 5–40)

## 2019-02-18 LAB — HEPATIC FUNCTION PANEL
ALT: 50 IU/L — ABNORMAL HIGH (ref 0–32)
AST: 34 IU/L (ref 0–40)
Albumin: 4.7 g/dL (ref 3.8–4.8)
Alkaline Phosphatase: 97 IU/L (ref 39–117)
Bilirubin Total: 0.8 mg/dL (ref 0.0–1.2)
Bilirubin, Direct: 0.2 mg/dL (ref 0.00–0.40)
Total Protein: 7.3 g/dL (ref 6.0–8.5)

## 2019-02-28 ENCOUNTER — Ambulatory Visit: Payer: BLUE CROSS/BLUE SHIELD | Admitting: Cardiovascular Disease

## 2019-03-28 ENCOUNTER — Other Ambulatory Visit: Payer: Self-pay | Admitting: Cardiovascular Disease

## 2019-05-26 ENCOUNTER — Other Ambulatory Visit: Payer: Self-pay

## 2019-05-26 MED ORDER — BUDESONIDE-FORMOTEROL FUMARATE 160-4.5 MCG/ACT IN AERO: 2.0000 | INHALATION_SPRAY | Freq: Two times a day (BID) | RESPIRATORY_TRACT | 0 refills | Status: DC

## 2019-05-30 ENCOUNTER — Ambulatory Visit (INDEPENDENT_AMBULATORY_CARE_PROVIDER_SITE_OTHER): Payer: BC Managed Care – PPO | Admitting: Allergy

## 2019-05-30 ENCOUNTER — Other Ambulatory Visit: Payer: Self-pay

## 2019-05-30 ENCOUNTER — Encounter: Payer: Self-pay | Admitting: Allergy

## 2019-05-30 VITALS — BP 112/68 | HR 86 | Temp 96.4°F | Resp 16 | Ht 64.4 in | Wt 179.4 lb

## 2019-05-30 DIAGNOSIS — L508 Other urticaria: Secondary | ICD-10-CM

## 2019-05-30 DIAGNOSIS — R12 Heartburn: Secondary | ICD-10-CM | POA: Diagnosis not present

## 2019-05-30 DIAGNOSIS — J3089 Other allergic rhinitis: Secondary | ICD-10-CM

## 2019-05-30 DIAGNOSIS — L2089 Other atopic dermatitis: Secondary | ICD-10-CM

## 2019-05-30 DIAGNOSIS — J301 Allergic rhinitis due to pollen: Secondary | ICD-10-CM

## 2019-05-30 DIAGNOSIS — J302 Other seasonal allergic rhinitis: Secondary | ICD-10-CM

## 2019-05-30 DIAGNOSIS — J4540 Moderate persistent asthma, uncomplicated: Secondary | ICD-10-CM | POA: Diagnosis not present

## 2019-05-30 NOTE — Assessment & Plan Note (Signed)
No hives, slightly itchy. Did not start famotidine as concerned about side effects.  Continue Claritin 1-2 tablets daily (as above).   May start famotidine 20 mg 1-2 times a day. This will help with itch and heartburn.   Continue montelukast 10 mg once a day as above.

## 2019-05-30 NOTE — Assessment & Plan Note (Signed)
Past history - 2018 skin testing was positive to grass, weed, tree, dust mites. Did not tolerate zyrtec in the past.  Interim history - Stable.   Continue Nasocort 1 spray in each nostril once a day as needed for a stuffy nose.   Continue Claritin 10 mg once a day as needed for allergies.   Nasal saline spray (i.e., Simply Saline) or nasal saline lavage (i.e., NeilMed) is recommended as needed and prior to medicated nasal sprays.  Continue environmental control measures.

## 2019-05-30 NOTE — Assessment & Plan Note (Signed)
Improved with cold weather.  Continue proper skin care.

## 2019-05-30 NOTE — Progress Notes (Signed)
Follow Up Note  RE: [name] MRN: [id number] DOB: [birth year] Date of Office Visit: 05/30/2019  Referring provider: [name], DO Primary care provider: [name], DO  Chief Complaint: Asthma  History of Present Illness: I had the pleasure of seeing Rachael Osborn for a follow up visit at the [facility] on 05/30/2019. She is a 64 y.o. female, who is being followed for asthma, atopic dermatitis, allergic rhinitis and urticaria. Her previous allergy office visit was on 10/11/2018 with Gareth Morgan, [place]. Today is a regular follow up visit.  Moderate persistent asthma Denies any SOB, coughing, wheezing, chest tightness, nocturnal awakenings, ER/urgent care visits or prednisone use since the last visit. Little wheezing in December. Currently on Symbicort 160 2 puffs twice a day.  Takes Singulair daily at night.   Atopic dermatitis Skin is doing better since the weather got cold. She did not need to have to use any of the prescription creams.   Seasonal allergic rhinitis due to pollen Takes Claritin daily and not needed to use Nasacort.   Chronic urticaria No hives. Patient did not start taking the famotidine as she was concerned about its side effects.   GERD Takes Prilosec in the morning with some benefit but still feels the acid.  Trying to watch her diet but does eat chocolate and sometimes drinks wine.   Assessment and Plan: Rachael Osborn is a 64 y.o. female with: Moderate persistent asthma without complication Stable with below regimen. Some wheezing in December which resolved with albuterol.   Today's ACT score 25. . Daily controller medication(s): continue Symbicort 160 2 puffs twice a day with spacer and rinse mouth afterwards. o Continue montelukast 10mg  daily. . Prior to physical activity: May use albuterol rescue inhaler 2 puffs 5 to 15 minutes prior to strenuous physical activities. [name] Rescue medications: May use albuterol rescue inhaler 2  puffs or nebulizer every 4 to 6 hours as needed for shortness of breath, chest tightness, coughing, and wheezing. Monitor frequency of use.  . During upper respiratory infections/asthma flares: Start Flovent 110 2 puffs twice a day with spacer and rinse mouth afterwards for 1-2 weeks.  . Will get spirometry at next visit instead of today due to COVID-19 pandemic and trying to minimize any type of aerosolizing procedures at this time in the office.   Seasonal and perennial allergic rhinitis Past history - 2018 skin testing was positive to grass, weed, tree, dust mites. Did not tolerate zyrtec in the past.  Interim history - Stable.   Continue Nasocort 1 spray in each nostril once a day as needed for a stuffy nose.   Continue Claritin 10 mg once a day as needed for allergies.   Nasal saline spray (i.e., Simply Saline) or nasal saline lavage (i.e., NeilMed) is recommended as needed and prior to medicated nasal sprays.  Continue environmental control measures.   Other atopic dermatitis Improved with cold weather.  Continue proper skin care.   Chronic urticaria No hives, slightly itchy. Did not start famotidine as concerned about side effects.  Continue Claritin 1-2 tablets daily (as above).   May start famotidine 20 mg 1-2 times a day. This will help with itch and heartburn.   Continue montelukast 10 mg once a day as above.   Heartburn Takes Prilosec with some benefit. Still having some symptoms.  Continue Prilosec 20mg  in the morning.  Continue lifestyle modification diet.  May start famotidine 20 mg 1-2 times a day for the  heartburn.    Return in about 6 months (around 11/27/2019).  Diagnostics: None.   Medication List:  Current Outpatient Medications  Medication Sig Dispense Refill  . albuterol (PROVENTIL HFA;VENTOLIN HFA) 108 (90 Base) MCG/ACT inhaler Inhale 2 puffs into the lungs every 6 (six) hours as needed for wheezing or shortness of breath. 1 each 2  . albuterol  (PROVENTIL) (2.5 MG/3ML) 0.083% nebulizer solution Take 3 mLs (2.5 mg total) by nebulization every 4 (four) hours as needed for wheezing or shortness of breath. 75 mL 1  . aspirin EC 81 MG tablet Take 81 mg by mouth daily.    . budesonide-formoterol (SYMBICORT) 160-4.5 MCG/ACT inhaler Inhale 2 puffs into the lungs 2 (two) times daily. 1 Inhaler 0  . clotrimazole-betamethasone (LOTRISONE) cream clotrimazole-betamethasone 1 %-0.05 % topical cream    . Crisaborole (EUCRISA) 2 % OINT Apply 1 application topically 2 (two) times daily. 60 g 3  . diltiazem (CARDIZEM LA) 180 MG 24 hr tablet Take 180 mg by mouth daily.   7  . diphenhydrAMINE (BENADRYL) 25 mg capsule Take 25 mg by mouth every 6 (six) hours as needed for allergies.    . fluticasone (FLONASE) 50 MCG/ACT nasal spray Place 2 sprays into both nostrils daily. 48 g 0  . gabapentin (NEURONTIN) 300 MG capsule Take 400 mg by mouth 4 (four) times daily.     . hydrochlorothiazide (HYDRODIURIL) 25 MG tablet Take 25 mg by mouth daily.     [name] levalbuterol (XOPENEX HFA) 45 MCG/ACT inhaler Use 2 puffs every 4-6 hours as needed 3 Inhaler 1  . loratadine (CLARITIN) 10 MG tablet Take 10 mg by mouth daily.    . meloxicam (MOBIC) 15 MG tablet Take 15 mg by mouth as needed.     . montelukast (SINGULAIR) 10 MG tablet Take one tablet by mouth once a day for coughing or wheezing. 90 tablet 3  . Multiple Vitamin (MULTIVITAMIN WITH MINERALS) TABS tablet Take 1 tablet by mouth daily.    [name] omeprazole (PRILOSEC) 20 MG capsule Take 1 capsule (20 mg total) by mouth daily. 30 capsule 0  . rosuvastatin (CRESTOR) 20 MG tablet TAKE 1 TABLET(20 MG) BY MOUTH DAILY 90 tablet 3  . sertraline (ZOLOFT) 25 MG tablet   1  . triamcinolone cream (KENALOG) 0.1 % triamcinolone acetonide 0.1 % topical cream    . TURMERIC PO Take 2 capsules by mouth daily.    . valsartan (DIOVAN) 80 MG tablet Take by mouth.    Cristy Friedlander HFA 110 MCG/ACT inhaler INHALE TWO PUFFS INTO THE LUNGS TWICE DAILY  DURING UPPER RESPIRATORY INFECTION FOR 1-2 WEEKS (Patient not taking: Reported on 05/30/2019) 12 g 0   Current Facility-Administered Medications  Medication Dose Route Frequency Provider Last Rate Last Admin  . predniSONE (DELTASONE) tablet 10 mg  10 mg Oral Q breakfast Bobbitt, Sed[place] Muta, MD       Allergies: Allergies  Allergen Reactions  . Robaxin [Methocarbamol] Rash  . Zyrtec [Cetirizine] Itching   I reviewed her past medical history, social history, family history, and environmental history and no significant changes have been reported from her previous visit.  Review of Systems  Constitutional: Negative for appetite change, chills, fever and unexpected weight change.  HENT: Negative for congestion and rhinorrhea.   Eyes: Negative for itching.  Respiratory: Negative for cough, chest tightness, shortness of breath and wheezing.   Gastrointestinal: Negative for abdominal pain.  Skin: Negative for rash.  Allergic/Immunologic: Positive for environmental allergies.  Neurological: Negative  for headaches.   Objective: BP 112/68   Pulse 86   Temp (!) 96.4 F (35.8 C) (Temporal)   Resp 16   Ht 5' 4.4" (1.636 m)   Wt 179 lb 6.4 oz (81.4 kg)   SpO2 98%   BMI 30.41 kg/m  Body mass index is 30.41 kg/m. Physical Exam  Constitutional: She is oriented to person, place, and time. She appears well-developed and well-nourished.  HENT:  Head: Normocephalic and atraumatic.  Right Ear: External ear normal.  Left Ear: External ear normal.  Nose: Nose normal.  Mouth/Throat: Oropharynx is clear and moist.  Eyes: Conjunctivae and EOM are normal.  Cardiovascular: Normal rate, regular rhythm and normal heart sounds. Exam reveals no gallop and no friction rub.  No murmur heard. Pulmonary/Chest: Effort normal and breath sounds normal. She has no wheezes. She has no rales.  Musculoskeletal:     Cervical back: Neck supple.  Neurological: She is alert and oriented to person, place, and time.   Skin: Skin is warm. No rash noted.  Psychiatric: She has a normal mood and affect. Her behavior is normal.  Nursing note and vitals reviewed.  Previous notes and tests were reviewed. The plan was reviewed with the patient/family, and all questions/concerned were addressed.  It was my pleasure to see Cara today and participate in her care. Please feel free to contact me with any questions or concerns.  Sincerely,  Rexene Alberts, DO Allergy & Immunology  [facility] office: [phone] [facility] office: [place] office: [phone]

## 2019-05-30 NOTE — Assessment & Plan Note (Signed)
Stable with below regimen. Some wheezing in December which resolved with albuterol.   Today's ACT score 25. . Daily controller medication(s): continue Symbicort 160 2 puffs twice a day with spacer and rinse mouth afterwards. o Continue montelukast 10mg  daily. . Prior to physical activity: May use albuterol rescue inhaler 2 puffs 5 to 15 minutes prior to strenuous physical activities. [name] Rescue medications: May use albuterol rescue inhaler 2 puffs or nebulizer every 4 to 6 hours as needed for shortness of breath, chest tightness, coughing, and wheezing. Monitor frequency of use.  . During upper respiratory infections/asthma flares: Start Flovent 110 2 puffs twice a day with spacer and rinse mouth afterwards for 1-2 weeks.  . Will get spirometry at next visit instead of today due to COVID-19 pandemic and trying to minimize any type of aerosolizing procedures at this time in the office.

## 2019-05-30 NOTE — Assessment & Plan Note (Signed)
Takes Prilosec with some benefit. Still having some symptoms.  Continue Prilosec 20mg  in the morning.  Continue lifestyle modification diet.  May start famotidine 20 mg 1-2 times a day for the heartburn.

## 2019-05-30 NOTE — Patient Instructions (Addendum)
Moderate persistent asthma . Daily controller medication(s): continue Symbicort 160 2 puffs twice a day with spacer and rinse mouth afterwards. o Continue montelukast 10mg  daily. . Prior to physical activity: May use albuterol rescue inhaler 2 puffs 5 to 15 minutes prior to strenuous physical activities. [name] Rescue medications: May use albuterol rescue inhaler 2 puffs or nebulizer every 4 to 6 hours as needed for shortness of breath, chest tightness, coughing, and wheezing. Monitor frequency of use.  . During upper respiratory infections/asthma flares: Start Flovent 110 2 puffs twice a day with spacer and rinse mouth afterwards for 1-2 weeks.  . Asthma control goals:  o Full participation in all desired activities (may need albuterol before activity) o Albuterol use two times or less a week on average (not counting use with activity) o Cough interfering with sleep two times or less a month o Oral steroids no more than once a year o No hospitalizations  Atopic dermatitis  Continue proper skin care.   Seasonal allergic rhinitis due to pollen  Continue Nasocort 1 spray in each nostril once a day as needed for a stuffy nose.   Continue Claritin 10 mg once a day as needed for allergies.   Nasal saline spray (i.e., Simply Saline) or nasal saline lavage (i.e., NeilMed) is recommended as needed and prior to medicated nasal sprays.  Chronic urticaria  Continue Claritin 1-2 tablets daily (as above).   May start famotidine 20 mg 1-2 times a day. This will help with itch and heartburn.   Continue montelukast 10 mg once a day as above  GERD  Continue Prilosec 20mg  in the morning.  Continue lifestyle modification diet.  May start famotidine 20 mg 1-2 times a day for the heartburn.    Follow up in 6 months or sooner if needed.   Heartburn Heartburn is a type of pain or discomfort that can happen in the throat or chest. It is often described as a burning pain. It may also cause a bad,  acid-like taste in the mouth. Heartburn may feel worse when you lie down or bend over. It may be worse at night. It may be caused by stomach contents that move back up (reflux) into the tube that connects the mouth with the stomach (esophagus). Follow these instructions at home: Eating and drinking   Avoid certain foods and drinks as told by your doctor. This may include: ? Coffee and tea (with or without caffeine). ? Drinks that have alcohol. ? Energy drinks and sports drinks. ? Carbonated drinks or sodas. ? Chocolate and cocoa. ? Peppermint and mint flavorings. ? Garlic and onions. ? Horseradish. ? Spicy and acidic foods, such as:  Peppers.  Chili powder and curry powder.  Vinegar.  Hot sauces and BBQ sauce. ? Citrus fruit juices and citrus fruits, such as:  Oranges.  Lemons.  Limes. ? Tomato-based foods, such as:  Red sauce and pizza with red sauce.  Chili.  Salsa. ? Fried and fatty foods, such as:  Donuts.  [place] fries and potato chips.  High-fat dressings. ? High-fat meats, such as:  Hot dogs and sausage.  Rib eye steak.  Ham and bacon. ? High-fat dairy items, such as:  Whole milk.  Butter.  Cream cheese.  Eat small meals often. Avoid eating large meals.  Avoid drinking large amounts of liquid with your meals.  Avoid eating meals during the 2-3 hours before bedtime.  Avoid lying down right after you eat.  Do not exercise right after you eat.  Lifestyle      If you are overweight, lose an amount of weight that is healthy for you. Ask your doctor about a safe weight loss goal.  Do not use any products that contain nicotine or tobacco, including cigarettes, e-cigarettes, and chewing tobacco. These can make your symptoms worse. If you need help quitting, ask your doctor.  Wear loose clothes. Do not wear anything tight around your waist.  Raise (elevate) the head of your bed about 6 inches (15 cm) when you sleep.  Try to lower your  stress. If you need help doing this, ask your doctor. General instructions  Pay attention to any changes in your symptoms.  Take over-the-counter and prescription medicines only as told by your doctor. ? Do not take aspirin, ibuprofen, or other NSAIDs unless your doctor says it is okay. ? Stop medicines only as told by your doctor.  Keep all follow-up visits as told by your doctor. This is important. Contact a doctor if:  You have new symptoms.  You lose weight and you do not know why it is happening.  You have trouble swallowing, or it hurts to swallow.  You have wheezing or a cough that keeps happening.  Your symptoms do not get better with treatment.  You have heartburn often for more than 2 weeks. Get help right away if:  You have pain in your arms, neck, jaw, teeth, or back.  You feel sweaty, dizzy, or light-headed.  You have chest pain or shortness of breath.  You throw up (vomit) and your throw up looks like blood or coffee grounds.  Your poop (stool) is bloody or black. These symptoms may represent a serious problem that is an emergency. Do not wait to see if the symptoms will go away. Get medical help right away. Call your local emergency services (911 in the [place] Do not drive yourself to the hospital. Summary  Heartburn is a type of pain that can happen in the throat or chest. It can feel like a burning pain. It may also cause a bad, acid-like taste in the mouth.  You may need to avoid certain foods and drinks to help your symptoms. Ask your doctor what foods and drinks you should avoid.  Take over-the-counter and prescription medicines only as told by your doctor. Do not take aspirin, ibuprofen, or other NSAIDs unless your doctor told you to do so.  Contact your doctor if your symptoms do not get better or they get worse. This information is not intended to replace advice given to you by your health care provider. Make sure you discuss any questions you have  with your health care provider. Document Revised: 09/17/2017 Document Reviewed: 09/17/2017 Elsevier Patient Education  [place].

## 2019-06-25 ENCOUNTER — Other Ambulatory Visit: Payer: Self-pay

## 2019-06-25 MED ORDER — FLOVENT HFA 110 MCG/ACT IN AERO: INHALATION_SPRAY | RESPIRATORY_TRACT | 0 refills | Status: DC

## 2019-06-26 ENCOUNTER — Telehealth: Payer: Self-pay

## 2019-06-26 ENCOUNTER — Other Ambulatory Visit: Payer: Self-pay

## 2019-06-26 MED ORDER — EUCRISA 2 % EX OINT: 1.0000 "application " | TOPICAL_OINTMENT | Freq: Two times a day (BID) | CUTANEOUS | 0 refills | Status: DC

## 2019-06-26 MED ORDER — FLUTICASONE PROPIONATE 50 MCG/ACT NA SUSP: 2.0000 | Freq: Every day | NASAL | 0 refills | Status: DC

## 2019-06-26 MED ORDER — BUDESONIDE-FORMOTEROL FUMARATE 160-4.5 MCG/ACT IN AERO: 2.0000 | INHALATION_SPRAY | Freq: Two times a day (BID) | RESPIRATORY_TRACT | 0 refills | Status: DC

## 2019-06-26 NOTE — Telephone Encounter (Signed)
She is asking for 90 day supplies for all of her meds that are increasing in price due to a change of insurance [facility]). She is only taking Flovent for her asthma flares and using her Symbicort daily. Please advise if it is ok to give these 90 day supplies even for the albuterol HFA. Thanks

## 2019-06-26 NOTE — Telephone Encounter (Deleted)
OK refill albuterol HFA.  Last ov 05/30/2019.  Per Dr. Maudie Mercury note:  May use albuterol rescue inhaler 2 puffs or nebulizer every 4 to 6 hours as needed for shortness of breath, chest tightness, coughing, and wheezing. Monitor frequency of use.  Return in about 6 months (around 11/27/2019). Sent in refill x1 additional refill.  [facility] [address]

## 2019-06-26 NOTE — Telephone Encounter (Deleted)
Damita, CMA is working on this refill.  Will discard this encounter as entered in error.

## 2019-06-26 NOTE — Telephone Encounter (Signed)
Dr. Maudie Mercury this patient is asking for a refill for Flovent 110 mcg but in your last note she was to be on it for 1-2 wks with no refills. Do you want me to fill this or have her come in for an OV? Please advise

## 2019-06-26 NOTE — Telephone Encounter (Signed)
See below for asthma treatment.  He is using the Flovent as add on treatment during URIs/ asthma flares. Can you clarify to make sure that's what he is doing?  Yes, it's okay to send in refill. Thank you.    Daily controller medication(s):continue Symbicort 160 2 puffs twice a day with spacer and rinse mouth afterwards. ? Continue montelukast 10mg  daily.  Prior to physical activity:May use albuterol rescue inhaler 2 puffs 5 to 15 minutes prior to strenuous physical activities.  Rescue medications:May use albuterol rescue inhaler 2 puffs or nebulizer every 4 to 6 hours as needed for shortness of breath, chest tightness, coughing, and wheezing. Monitor frequency of use.   During upper respiratory infections/asthma flares: Start Flovent 110 2 puffs twice a day with spacer and rinse mouth afterwards for 1-2 weeks.

## 2019-06-27 MED ORDER — MONTELUKAST SODIUM 10 MG PO TABS: ORAL_TABLET | ORAL | 2 refills | Status: DC

## 2019-06-27 MED ORDER — FLOVENT HFA 110 MCG/ACT IN AERO: INHALATION_SPRAY | RESPIRATORY_TRACT | 0 refills | Status: DC

## 2019-06-27 MED ORDER — ALBUTEROL SULFATE HFA 108 (90 BASE) MCG/ACT IN AERS: 2.0000 | INHALATION_SPRAY | RESPIRATORY_TRACT | 0 refills | Status: DC | PRN

## 2019-06-27 NOTE — Telephone Encounter (Signed)
I sent in 90 day supply for Rachael Osborn, Rachael Osborn, Rachael Osborn to [facility]. Looks like Symbicort, [name] and Flonase was already sent in on 2/25.

## 2019-06-27 NOTE — Addendum Note (Signed)
Addended by: Garnet Sierras on: 06/27/2019 08:56 AM   Modules accepted: Orders

## 2019-06-30 NOTE — Telephone Encounter (Signed)
Thank you, I will inform patient.

## 2019-07-25 ENCOUNTER — Other Ambulatory Visit: Payer: Self-pay

## 2019-07-25 NOTE — Telephone Encounter (Signed)
Fax refill request came in for Flovent 110.  Per Dr. Maudie Mercury, she sent in a 90 days supply of Flovent 110 on 06/27/2019. Patient last OV was 05/30/19.  Recommended return visit per chart in 6 months, around 11/27/19. Patient 90 days refill refill should be sufficient until around 09/24/19. Denied refill.

## 2019-08-04 ENCOUNTER — Other Ambulatory Visit: Payer: Self-pay

## 2019-08-04 MED ORDER — FLOVENT HFA 110 MCG/ACT IN AERO: INHALATION_SPRAY | RESPIRATORY_TRACT | 0 refills | Status: DC

## 2019-08-11 DIAGNOSIS — M549 Dorsalgia, unspecified: Secondary | ICD-10-CM | POA: Diagnosis not present

## 2019-08-11 DIAGNOSIS — I10 Essential (primary) hypertension: Secondary | ICD-10-CM | POA: Diagnosis not present

## 2019-08-11 DIAGNOSIS — M545 Low back pain: Secondary | ICD-10-CM | POA: Diagnosis not present

## 2019-08-14 DIAGNOSIS — H524 Presbyopia: Secondary | ICD-10-CM | POA: Diagnosis not present

## 2019-08-14 DIAGNOSIS — H2513 Age-related nuclear cataract, bilateral: Secondary | ICD-10-CM | POA: Diagnosis not present

## 2019-08-14 DIAGNOSIS — H25013 Cortical age-related cataract, bilateral: Secondary | ICD-10-CM | POA: Diagnosis not present

## 2019-08-14 DIAGNOSIS — H52203 Unspecified astigmatism, bilateral: Secondary | ICD-10-CM | POA: Diagnosis not present

## 2019-08-14 DIAGNOSIS — H04123 Dry eye syndrome of bilateral lacrimal glands: Secondary | ICD-10-CM | POA: Diagnosis not present

## 2019-08-14 DIAGNOSIS — H43393 Other vitreous opacities, bilateral: Secondary | ICD-10-CM | POA: Diagnosis not present

## 2019-08-14 DIAGNOSIS — H5213 Myopia, bilateral: Secondary | ICD-10-CM | POA: Diagnosis not present

## 2019-08-18 ENCOUNTER — Other Ambulatory Visit: Payer: Self-pay

## 2019-08-18 MED ORDER — BUDESONIDE-FORMOTEROL FUMARATE 160-4.5 MCG/ACT IN AERO: 2.0000 | INHALATION_SPRAY | Freq: Two times a day (BID) | RESPIRATORY_TRACT | 0 refills | Status: DC

## 2019-09-29 ENCOUNTER — Other Ambulatory Visit: Payer: Self-pay | Admitting: Family Medicine

## 2019-09-30 ENCOUNTER — Telehealth: Payer: Self-pay | Admitting: Allergy

## 2019-09-30 NOTE — Telephone Encounter (Signed)
Patient states that she now has Medicare, so her inhlaer that she normally takes is now at a higher rate and she is requesting a call back to see if there is an alternative.

## 2019-10-01 NOTE — Telephone Encounter (Signed)
Called patient to let her know I looked on [place] and I did not see any other drug compatible to Symbicort that was at a lower tier. She spoke with her insurance company to see if Symbicort was eligible for the copay reduction. She will call us back to let us know. Please advise if there is any other inhaler that you think would be cheaper for patient.

## 2019-10-01 NOTE — Telephone Encounter (Signed)
You guys have access to cost of meds/formularies than I do. Thanks.

## 2019-10-02 NOTE — Telephone Encounter (Signed)
PT called to see if we have symbicort samples. PT still waiting to see if insurance will lower copay.

## 2019-10-03 NOTE — Telephone Encounter (Addendum)
We do not have any samples of Symbicort in [facility] clinic.  Called patient to inform.  She has spoke with someone at her insurance company.  They are looking into payment for Symicort and alternative medications.  She is waiting on a call back from the insurance company today.

## 2019-10-11 ENCOUNTER — Other Ambulatory Visit: Payer: Self-pay | Admitting: Family Medicine

## 2019-10-20 DIAGNOSIS — R229 Localized swelling, mass and lump, unspecified: Secondary | ICD-10-CM | POA: Diagnosis not present

## 2019-10-30 DIAGNOSIS — Z5181 Encounter for therapeutic drug level monitoring: Secondary | ICD-10-CM | POA: Diagnosis not present

## 2019-10-30 DIAGNOSIS — I10 Essential (primary) hypertension: Secondary | ICD-10-CM | POA: Diagnosis not present

## 2019-10-30 DIAGNOSIS — R7303 Prediabetes: Secondary | ICD-10-CM | POA: Diagnosis not present

## 2019-10-30 DIAGNOSIS — E559 Vitamin D deficiency, unspecified: Secondary | ICD-10-CM | POA: Diagnosis not present

## 2019-10-31 DIAGNOSIS — F419 Anxiety disorder, unspecified: Secondary | ICD-10-CM | POA: Diagnosis not present

## 2019-10-31 DIAGNOSIS — I10 Essential (primary) hypertension: Secondary | ICD-10-CM | POA: Diagnosis not present

## 2019-10-31 DIAGNOSIS — R7303 Prediabetes: Secondary | ICD-10-CM | POA: Diagnosis not present

## 2019-10-31 DIAGNOSIS — E785 Hyperlipidemia, unspecified: Secondary | ICD-10-CM | POA: Diagnosis not present

## 2019-10-31 DIAGNOSIS — J4540 Moderate persistent asthma, uncomplicated: Secondary | ICD-10-CM | POA: Diagnosis not present

## 2019-10-31 DIAGNOSIS — Z23 Encounter for immunization: Secondary | ICD-10-CM | POA: Diagnosis not present

## 2019-10-31 DIAGNOSIS — I471 Supraventricular tachycardia: Secondary | ICD-10-CM | POA: Diagnosis not present

## 2019-11-04 DIAGNOSIS — R600 Localized edema: Secondary | ICD-10-CM | POA: Diagnosis not present

## 2019-11-04 DIAGNOSIS — M2341 Loose body in knee, right knee: Secondary | ICD-10-CM | POA: Diagnosis not present

## 2019-11-04 DIAGNOSIS — S83271A Complex tear of lateral meniscus, current injury, right knee, initial encounter: Secondary | ICD-10-CM | POA: Diagnosis not present

## 2019-11-04 DIAGNOSIS — M948X6 Other specified disorders of cartilage, lower leg: Secondary | ICD-10-CM | POA: Diagnosis not present

## 2019-11-04 DIAGNOSIS — M23322 Other meniscus derangements, posterior horn of medial meniscus, left knee: Secondary | ICD-10-CM | POA: Diagnosis not present

## 2019-11-05 ENCOUNTER — Telehealth: Payer: Self-pay | Admitting: Allergy

## 2019-11-05 ENCOUNTER — Other Ambulatory Visit: Payer: Self-pay | Admitting: Cardiovascular Disease

## 2019-11-05 MED ORDER — LEVALBUTEROL TARTRATE 45 MCG/ACT IN AERO: INHALATION_SPRAY | RESPIRATORY_TRACT | 0 refills | Status: DC

## 2019-11-05 MED ORDER — MONTELUKAST SODIUM 10 MG PO TABS: ORAL_TABLET | ORAL | 0 refills | Status: DC

## 2019-11-05 MED ORDER — EUCRISA 2 % EX OINT: 1.0000 "application " | TOPICAL_OINTMENT | Freq: Two times a day (BID) | CUTANEOUS | 0 refills | Status: DC

## 2019-11-05 MED ORDER — BUDESONIDE-FORMOTEROL FUMARATE 160-4.5 MCG/ACT IN AERO: 2.0000 | INHALATION_SPRAY | Freq: Two times a day (BID) | RESPIRATORY_TRACT | 0 refills | Status: DC

## 2019-11-05 MED ORDER — FLUTICASONE PROPIONATE 50 MCG/ACT NA SUSP: NASAL | 0 refills | Status: DC

## 2019-11-05 MED ORDER — LORATADINE 10 MG PO TABS: ORAL_TABLET | ORAL | 0 refills | Status: DC

## 2019-11-05 NOTE — Telephone Encounter (Signed)
PT IS DUE FOR 6 MONTH PT informed pt of this an scheduled her for an appt with c dale fnp for next week and and sent in rx's for pt. Pt informed and understood

## 2019-11-05 NOTE — Telephone Encounter (Signed)
Patient is in need of all meds for a 90day supply  Xopenex - she needs PA sent to [facility]

## 2019-11-05 NOTE — Telephone Encounter (Signed)
*  STAT* If patient is at the pharmacy, call can be transferred to refill team.   1. Which medications need to be refilled? (please list name of each medication and dose if known)  rosuvastatin (CRESTOR) 20 MG tablet  2. Which pharmacy/location (including street and city if local pharmacy) is medication to be sent to? [id number] - [place], [place] - [address]  3. Do they need a 30 day or 90 day supply? 90 day

## 2019-11-06 MED ORDER — ROSUVASTATIN CALCIUM 20 MG PO TABS: ORAL_TABLET | ORAL | 3 refills | Status: DC

## 2019-11-06 NOTE — Telephone Encounter (Signed)
Refill for Crestor sent to pharmacy.

## 2019-11-11 ENCOUNTER — Ambulatory Visit: Payer: Self-pay | Admitting: Family

## 2019-11-11 ENCOUNTER — Ambulatory Visit (INDEPENDENT_AMBULATORY_CARE_PROVIDER_SITE_OTHER): Payer: PPO | Admitting: Family

## 2019-11-11 ENCOUNTER — Encounter: Payer: Self-pay | Admitting: Family

## 2019-11-11 ENCOUNTER — Other Ambulatory Visit: Payer: Self-pay

## 2019-11-11 VITALS — BP 114/76 | HR 80 | Temp 98.1°F | Resp 16 | Ht 67.0 in | Wt 189.0 lb

## 2019-11-11 DIAGNOSIS — R12 Heartburn: Secondary | ICD-10-CM | POA: Diagnosis not present

## 2019-11-11 DIAGNOSIS — J4540 Moderate persistent asthma, uncomplicated: Secondary | ICD-10-CM

## 2019-11-11 DIAGNOSIS — L2089 Other atopic dermatitis: Secondary | ICD-10-CM

## 2019-11-11 DIAGNOSIS — J301 Allergic rhinitis due to pollen: Secondary | ICD-10-CM | POA: Diagnosis not present

## 2019-11-11 DIAGNOSIS — L508 Other urticaria: Secondary | ICD-10-CM | POA: Diagnosis not present

## 2019-11-11 MED ORDER — FLOVENT HFA 110 MCG/ACT IN AERO
2.0000 | INHALATION_SPRAY | Freq: Two times a day (BID) | RESPIRATORY_TRACT | 1 refills | Status: DC
Start: 2019-11-11 — End: 2020-02-16

## 2019-11-11 NOTE — Progress Notes (Addendum)
Rachael Osborn [id number] Dept: [phone]  FOLLOW UP NOTE  Patient ID: Rachael Osborn, female    DOB: 01/13/1956  Age: 64 y.o. MRN: [id number] Date of Office Visit: 11/11/2019  Assessment  Chief Complaint: Asthma  HPI Rachael Osborn is a 64 year old female who presents for follow-up of moderate persistent asthma, atopic dermatitis, seasonal allergic rhinitis, chronic urticaria, and GERD.  She was last seen on May 30, 2019 by Dr. Maudie Mercury.  Moderate persistent asthma is reported as well controlled with the use of Singulair 10 mg once a day.  She quit taking her Symbicort 160/4.5 about a month ago due to the rising cost.  Her Symbicort used to cost her $5 and it will now cost her $45 a month.  She spoke with the pharmacy with [facility] [employer] and she found out she would not qualify for any resources.  Since being off her Symbicort she reports that her mood is much better and her breathing is better.  She would like to try to be off all inhalers.  Discussed the importance of using asthma stepdown therapy process rather than quitting all inhalers.  She also has not used her Flovent 110 mcg inhaler in the past couple months.  She reports some occasional dry cough and denies any wheezing, tightness in her chest, shortness of breath and nocturnal awakenings.  She has not required any systemic steroids or made any trips to the emergency room or urgent care since her last office visit.  She has used her albuterol inhaler 1-2 times this past month.  Atopic dermatitis is reported as doing much better with the use of Vaseline and lotion.  Allergic rhinitis is reported as moderately controlled with the use of Claritin 10 mg once a day and Nasacort as needed.  She reports occasional nasal congestion at night, a small amount of postnasal drip and reports that Nasacort dries her out at times.  Chronic urticaria is reported as doing much better with Claritin 10 mg 1 tablet once a day and  montelukast 10 mg once a day.  She did not ever start the famotidine 20 mg 1-2 times a day due to her concerns about the side effects.  Reflux is reported as not well controlled at times with the use of Prilosec 20 mg once a day.  She did not start the famotidine 20 mg 1-2 times a day due to the concern of side effects.She has noticed that when she takes her calcium with vitamin D at night this will help her symptoms.  She reports that this she does not want to take anything else for her heartburn at this time and that she tries to watch her diet.  Current medications are as listed in the chart.    Drug Allergies:  Allergies  Allergen Reactions  . Robaxin [Methocarbamol] Rash  . Zyrtec [Cetirizine] Itching    Review of Systems: Review of Systems  Constitutional: Negative for chills and fever.  HENT: Positive for congestion.   Eyes: Negative for blurred vision and double vision.  Respiratory: Negative for shortness of breath and wheezing.        Reports occasional dry cough  Cardiovascular: Negative for chest pain and palpitations.  Gastrointestinal: Positive for heartburn. Negative for abdominal pain.  Genitourinary: Negative for dysuria.  Skin: Negative for itching and rash.  Neurological: Negative for headaches.  Endo/Heme/Allergies: Positive for environmental allergies.    Physical Exam: BP 114/76 (BP Location: Left Arm, Patient Position: Sitting,  Cuff Size: Normal)   Pulse 80   Temp 98.1 F (36.7 C) (Oral)   Resp 16   Ht 5\' 7"  (1.702 m)   Wt 189 lb (85.7 kg)   SpO2 98%   BMI 29.60 kg/m    Physical Exam Constitutional:      Appearance: Normal appearance.  HENT:     Head: Normocephalic and atraumatic.     Comments: Pharynx normal. Eyes normal. Ears normal. Nose mildly edematous and erythematous. No drainage noted.    Right Ear: Tympanic membrane, ear canal and external ear normal.     Left Ear: Tympanic membrane, ear canal and external ear normal.     Mouth/Throat:      Mouth: Mucous membranes are moist.     Pharynx: Oropharynx is clear.  Eyes:     Conjunctiva/sclera: Conjunctivae normal.  Cardiovascular:     Rate and Rhythm: Regular rhythm.     Heart sounds: Normal heart sounds.  Pulmonary:     Effort: Pulmonary effort is normal.     Breath sounds: Normal breath sounds.     Comments: Lungs clear to auscultation. Musculoskeletal:     Cervical back: Neck supple.  Skin:    General: Skin is warm.  Neurological:     Mental Status: She is alert and oriented to person, place, and time.  Psychiatric:        Mood and Affect: Mood normal.        Behavior: Behavior normal.        Thought Content: Thought content normal.        Judgment: Judgment normal.     Diagnostics: FVC 3.12 L, FEV1 2.61 L.  Predicted FVC 2.86 L, FEV1 2.23 L.  Spirometry indicates normal ventilatory function.  Assessment and Plan: 1. Moderate persistent asthma without complication   2. Seasonal allergic rhinitis due to pollen   3. Chronic urticaria   4. Other atopic dermatitis   5. Heartburn     Meds ordered this encounter  Medications  . fluticasone (FLOVENT HFA) 110 MCG/ACT inhaler    Sig: Inhale 2 puffs into the lungs 2 (two) times daily.    Dispense:  36 g    Refill:  1    Patient Instructions  Asthma Stop Symbicort 160 Continue montelukast 10 mg once a day to help prevent cough or wheeze. May use Xopenex 2 puffs every 6 hours as needed for cough, wheeze, tightness in chest or shortness of breath.  Start Flovent 110- 2 puffs twice a day with spacer to help prevent cough or wheeze. Asthma control goals:   Full participation in all desired activities (may need albuterol before activity)  Albuterol use two time or less a week on average (not counting use with activity)  Cough interfering with sleep two time or less a month  Oral steroids no more than once a year  No hospitalizations  Seasonal allergic rhinitis ( 2018 skin test positive to grass, weeds,  tree and dust mite) Continue Nasacort 1 spray each nostril once a a day as needed for stuffy nose. Continue Claritin 10 mg once a day to help with runny nose or itching May use saline spray or saline nasal rinse as needed for nasal symptoms. Please use this prior to any medicated nasal sprays. Also, may use saline gel to help with nasal dryness.  Chronic urticaria  Continue Claritin 1-2 tablets by mouth once a day Continue montelukast 10 mg once aday  Eczema Continue daily moisturizer  Heartburn Continue Prilosec  20 mg in the morning Schedule follow up appointment with your GI doctor.  Please let us know if this treatment plan is not working well for you. Schedule follow up appointment in 3 months   Return in about 3 months (around 02/11/2020), or if symptoms worsen or fail to improve.    Thank you for the opportunity to care for this patient.  Please do not hesitate to contact me with questions.  Althea Charon, FNP [facility]   ________________________________________________  I have provided oversight concerning Altha Harm Jennie Hannay's evaluation and treatment of this patient's health issues addressed during today's encounter.  I agree with the assessment and therapeutic plan as outlined in the note.   Signed,   R Edgar Frisk, MD

## 2019-11-11 NOTE — Patient Instructions (Addendum)
Asthma Stop Symbicort 160 Continue montelukast 10 mg once a day to help prevent cough or wheeze. May use Xopenex 2 puffs every 6 hours as needed for cough, wheeze, tightness in chest or shortness of breath.  Start Flovent 110- 2 puffs twice a day with spacer to help prevent cough or wheeze. Asthma control goals:   Full participation in all desired activities (may need albuterol before activity)  Albuterol use two time or less a week on average (not counting use with activity)  Cough interfering with sleep two time or less a month  Oral steroids no more than once a year  No hospitalizations  Seasonal allergic rhinitis ( 2018 skin test positive to grass, weeds, tree and dust mite) Continue Nasacort 1 spray each nostril once a a day as needed for stuffy nose. Continue Claritin 10 mg once a day to help with runny nose or itching May use saline spray or saline nasal rinse as needed for nasal symptoms. Please use this prior to any medicated nasal sprays. Also, may use saline gel to help with nasal dryness.  Chronic urticaria  Continue Claritin 1-2 tablets by mouth once a day Continue montelukast 10 mg once aday  Eczema Continue daily moisturizer  Heartburn Continue Prilosec 20 mg in the morning Schedule follow up appointment with your GI doctor.  Please let us know if this treatment plan is not working well for you. Schedule follow up appointment in 3 months

## 2019-11-19 DIAGNOSIS — I10 Essential (primary) hypertension: Secondary | ICD-10-CM | POA: Diagnosis not present

## 2019-11-25 DIAGNOSIS — I471 Supraventricular tachycardia: Secondary | ICD-10-CM | POA: Diagnosis not present

## 2019-11-25 DIAGNOSIS — J4540 Moderate persistent asthma, uncomplicated: Secondary | ICD-10-CM | POA: Diagnosis not present

## 2019-11-25 DIAGNOSIS — Z9889 Other specified postprocedural states: Secondary | ICD-10-CM | POA: Diagnosis not present

## 2019-11-25 DIAGNOSIS — E7800 Pure hypercholesterolemia, unspecified: Secondary | ICD-10-CM | POA: Diagnosis not present

## 2019-11-25 DIAGNOSIS — I10 Essential (primary) hypertension: Secondary | ICD-10-CM | POA: Diagnosis not present

## 2019-12-10 ENCOUNTER — Telehealth: Payer: Self-pay | Admitting: Allergy and Immunology

## 2019-12-10 NOTE — Telephone Encounter (Signed)
Pt. Has stopped taking Flovent inhaler it seems to be  causing an elevation in her BP,Pt would like to be advised

## 2019-12-10 NOTE — Telephone Encounter (Signed)
Stopped flovent, bp has gotten better, asthma is doing fine and has albuterol as needed.

## 2019-12-10 NOTE — Telephone Encounter (Signed)
Noted.  If asthma symptoms worsen and having to use albuterol more than twice a week then call the office and let us know. May need a different maintenance inhaler.  Keep appointment in October.

## 2019-12-11 NOTE — Telephone Encounter (Signed)
Left a detailed message per DPR explaining what Dr. Maudie Mercury had said.

## 2019-12-12 DIAGNOSIS — J00 Acute nasopharyngitis [common cold]: Secondary | ICD-10-CM | POA: Diagnosis not present

## 2019-12-12 DIAGNOSIS — J029 Acute pharyngitis, unspecified: Secondary | ICD-10-CM | POA: Diagnosis not present

## 2019-12-12 DIAGNOSIS — H6982 Other specified disorders of Eustachian tube, left ear: Secondary | ICD-10-CM | POA: Diagnosis not present

## 2019-12-12 DIAGNOSIS — Z20822 Contact with and (suspected) exposure to covid-19: Secondary | ICD-10-CM | POA: Diagnosis not present

## 2019-12-12 DIAGNOSIS — R0989 Other specified symptoms and signs involving the circulatory and respiratory systems: Secondary | ICD-10-CM | POA: Diagnosis not present

## 2019-12-12 DIAGNOSIS — R0981 Nasal congestion: Secondary | ICD-10-CM | POA: Diagnosis not present

## 2020-01-16 DIAGNOSIS — Z0000 Encounter for general adult medical examination without abnormal findings: Secondary | ICD-10-CM | POA: Diagnosis not present

## 2020-01-16 DIAGNOSIS — M1711 Unilateral primary osteoarthritis, right knee: Secondary | ICD-10-CM | POA: Diagnosis not present

## 2020-01-16 DIAGNOSIS — I10 Essential (primary) hypertension: Secondary | ICD-10-CM | POA: Diagnosis not present

## 2020-01-29 DIAGNOSIS — Z1231 Encounter for screening mammogram for malignant neoplasm of breast: Secondary | ICD-10-CM | POA: Diagnosis not present

## 2020-02-04 DIAGNOSIS — Z23 Encounter for immunization: Secondary | ICD-10-CM | POA: Diagnosis not present

## 2020-02-09 ENCOUNTER — Other Ambulatory Visit: Payer: Self-pay | Admitting: Family Medicine

## 2020-02-16 ENCOUNTER — Encounter: Payer: Self-pay | Admitting: Allergy and Immunology

## 2020-02-16 ENCOUNTER — Ambulatory Visit: Payer: PPO | Admitting: Allergy and Immunology

## 2020-02-16 ENCOUNTER — Other Ambulatory Visit: Payer: Self-pay

## 2020-02-16 VITALS — BP 102/66 | HR 85 | Temp 98.9°F | Resp 16 | Ht 65.0 in | Wt 181.9 lb

## 2020-02-16 DIAGNOSIS — J4530 Mild persistent asthma, uncomplicated: Secondary | ICD-10-CM

## 2020-02-16 DIAGNOSIS — J3089 Other allergic rhinitis: Secondary | ICD-10-CM | POA: Diagnosis not present

## 2020-02-16 MED ORDER — FLUTICASONE PROPIONATE 50 MCG/ACT NA SUSP
NASAL | 1 refills | Status: DC
Start: 2020-02-16 — End: 2021-06-13

## 2020-02-16 MED ORDER — MONTELUKAST SODIUM 10 MG PO TABS
ORAL_TABLET | ORAL | 1 refills | Status: DC
Start: 2020-02-16 — End: 2020-07-16

## 2020-02-16 MED ORDER — LEVALBUTEROL TARTRATE 45 MCG/ACT IN AERO
INHALATION_SPRAY | RESPIRATORY_TRACT | 1 refills | Status: DC
Start: 2020-02-16 — End: 2020-04-05

## 2020-02-16 MED ORDER — LORATADINE 10 MG PO CAPS: 10.0000 mg | ORAL_CAPSULE | Freq: Every day | ORAL | 1 refills | Status: DC | PRN

## 2020-02-16 MED ORDER — ALBUTEROL SULFATE HFA 108 (90 BASE) MCG/ACT IN AERS: 2.0000 | INHALATION_SPRAY | RESPIRATORY_TRACT | 1 refills | Status: DC | PRN

## 2020-02-16 NOTE — Progress Notes (Signed)
Follow-up Note  RE: Rachael Osborn MRN: [id number] DOB: 04-04-1956 Date of Office Visit: 02/16/2020  Primary care provider: [name], DO Referring provider: [name], DO  History of present illness: Rachael Osborn is a 64 y.o. female with asthma and allergic rhinitis presenting today for follow-up.  She was last seen in this clinic in July 2021 by Althea Charon, NP.  She reports that her asthma has been stable while taking montelukast 10 mg daily at bedtime and Xopenex if needed.  She reports that she experiences asthma symptoms 1 time per week on average, typically with rapid temperature changes.  She does not experience limitations in normal daily activities or nocturnal awakenings due to lower respiratory symptoms. She reports that her nasal allergy symptoms are relatively well controlled with loratadine daily and fluticasone nasal spray when needed.  She states that on occasion she experiences nasal congestion at nighttime which is relieved with fluticasone nasal spray.  Assessment and plan: Mild persistent asthma Stable.  Continue montelukast 10 mg daily.  Xopenex 1 to 2 inhalations every 4-6 hours if needed.  The patient has agreed to contact us if she has any asthma related problems.  Subjective and objective measures of pulmonary function will be followed and the treatment plan will be adjusted accordingly.  Other allergic rhinitis  Continue appropriate allergen avoidance measures.  Continue loratadine 10 mg daily as needed.  To avoid diminishing benefit with daily use (tachyphylaxis) of second generation antihistamine, consider alternating every few months between fexofenadine (Allegra) and loratadine (Claritin).  Continue fluticasone nasal spray, 1 to 2 sprays per nostril daily if needed.  Nasal saline spray (i.e., Simply Saline) or nasal saline lavage (i.e., NeilMed) is recommended as needed and prior to medicated nasal sprays.   Meds ordered this  encounter  Medications  . montelukast (SINGULAIR) 10 MG tablet    Sig: TAKE 1 TABLET BY MOUTH EVERY DAY FOR COUGHING OR WHEEZING    Dispense:  90 tablet    Refill:  1    Dispense 90 day supply  . levalbuterol (XOPENEX HFA) 45 MCG/ACT inhaler    Sig: Use 2 puffs every 4-6 hours as needed    Dispense:  1 each    Refill:  1    Patient must have office visit for further refills.  . fluticasone (FLONASE) 50 MCG/ACT nasal spray    Sig: 1-2 sprays per nostril daily as needed    Dispense:  48 g    Refill:  1    Dispense 90 day supply  . albuterol (VENTOLIN HFA) 108 (90 Base) MCG/ACT inhaler    Sig: Inhale 2 puffs into the lungs every 4 (four) hours as needed for wheezing or shortness of breath.    Dispense:  1 each    Refill:  1  . Loratadine 10 MG CAPS    Sig: Take 1 capsule (10 mg total) by mouth daily as needed.    Dispense:  90 capsule    Refill:  1    Dispense 90 day supply    Diagnostics: Spirometry:  Normal with an FEV1 of 121% predicted. This study was performed while the patient was asymptomatic.  Please see scanned spirometry results for details.    Physical examination: Blood pressure 102/66, pulse 85, temperature 98.9 F (37.2 C), temperature source Tympanic, resp. rate 16, height 5\' 5"  (1.651 m), weight 181 lb 14.1 oz (82.5 kg), SpO2 97 %.  General: Alert, interactive, in no acute distress. HEENT: TMs  pearly gray, turbinates mildly edematous without discharge, post-pharynx unremarkable. Neck: Supple without lymphadenopathy. Lungs: Clear to auscultation without wheezing, rhonchi or rales. CV: Normal S1, S2 without murmurs. Skin: Warm and dry, without lesions or rashes.  The following portions of the patient's history were reviewed and updated as appropriate: allergies, current medications, past family history, past medical history, past social history, past surgical history and problem list.  Current Outpatient Medications  Medication Sig Dispense Refill  .  albuterol (PROVENTIL) (2.5 MG/3ML) 0.083% nebulizer solution Take 3 mLs (2.5 mg total) by nebulization every 4 (four) hours as needed for wheezing or shortness of breath. 75 mL 1  . albuterol (VENTOLIN HFA) 108 (90 Base) MCG/ACT inhaler Inhale 2 puffs into the lungs every 4 (four) hours as needed for wheezing or shortness of breath. 1 each 1  . aspirin EC 81 MG tablet Take 81 mg by mouth daily.    . clotrimazole-betamethasone (LOTRISONE) cream clotrimazole-betamethasone 1 %-0.05 % topical cream    . Crisaborole (EUCRISA) 2 % OINT Apply 1 application topically 2 (two) times daily. 180 g 0  . diltiazem (CARDIZEM LA) 180 MG 24 hr tablet Take 180 mg by mouth daily.   7  . diltiazem (TIAZAC) 180 MG 24 hr capsule TAKE 1 CAPSULE(180 MG) BY MOUTH DAILY    . diphenhydrAMINE (BENADRYL) 25 mg capsule Take 25 mg by mouth every 6 (six) hours as needed for allergies.    . fluticasone (FLONASE) 50 MCG/ACT nasal spray 1-2 sprays per nostril daily as needed 48 g 1  . gabapentin (NEURONTIN) 400 MG capsule Take by mouth.    . hydrochlorothiazide (HYDRODIURIL) 25 MG tablet Take 25 mg by mouth daily.     [name] levalbuterol (XOPENEX HFA) 45 MCG/ACT inhaler Use 2 puffs every 4-6 hours as needed 1 each 1  . loratadine (CLARITIN) 10 MG tablet TAKE 1 TABLET BY MOUTH TWICE DAILY AS NEEDED FOR ALLERGIES 90 tablet 0  . montelukast (SINGULAIR) 10 MG tablet TAKE 1 TABLET BY MOUTH EVERY DAY FOR COUGHING OR WHEEZING 90 tablet 1  . Multiple Vitamin (MULTIVITAMIN WITH MINERALS) TABS tablet Take 1 tablet by mouth daily.    . pantoprazole (PROTONIX) 40 MG tablet Take 1 tablet by mouth daily.    . rosuvastatin (CRESTOR) 20 MG tablet TAKE 1 TABLET(20 MG) BY MOUTH DAILY 90 tablet 3  . sertraline (ZOLOFT) 25 MG tablet   1  . tiZANidine (ZANAFLEX) 2 MG tablet Take 2 mg by mouth daily.    [name] triamcinolone cream (KENALOG) 0.1 % triamcinolone acetonide 0.1 % topical cream    . TURMERIC PO Take 2 capsules by mouth daily.    . valsartan (DIOVAN)  80 MG tablet Take by mouth.    . budesonide-formoterol (SYMBICORT) 160-4.5 MCG/ACT inhaler Inhale 2 puffs into the lungs 2 (two) times daily. (Patient not taking: Reported on 02/16/2020) 3 Inhaler 0  . fluticasone (FLOVENT HFA) 110 MCG/ACT inhaler INHALE TWO PUFFS INTO THE LUNGS TWICE DAILY DURING UPPER RESPIRATORY INFECTION FOR 1-2 WEEKS (Patient not taking: Reported on 02/16/2020) 36 g 0  . Loratadine 10 MG CAPS Take 1 capsule (10 mg total) by mouth daily as needed. 90 capsule 1   Current Facility-Administered Medications  Medication Dose Route Frequency Provider Last Rate Last Admin  . predniSONE (DELTASONE) tablet 10 mg  10 mg Oral Q breakfast Herley Bernardini, Sedalia Muta, MD        Allergies  Allergen Reactions  . Robaxin [Methocarbamol] Rash  . Zyrtec [Cetirizine] Itching  I appreciate the opportunity to take part in [place] care. Please do not hesitate to contact me with questions.  Sincerely,   R. Edgar Frisk, MD

## 2020-02-16 NOTE — Patient Instructions (Addendum)
Mild persistent asthma Stable.  Continue montelukast 10 mg daily.  Xopenex 1 to 2 inhalations every 4-6 hours if needed.  The patient has agreed to contact us if she has any asthma related problems.  Subjective and objective measures of pulmonary function will be followed and the treatment plan will be adjusted accordingly.  Other allergic rhinitis  Continue appropriate allergen avoidance measures.  Continue loratadine 10 mg daily as needed.  To avoid diminishing benefit with daily use (tachyphylaxis) of second generation antihistamine, consider alternating every few months between fexofenadine (Allegra) and loratadine (Claritin).  Continue fluticasone nasal spray, 1 to 2 sprays per nostril daily if needed.  Nasal saline spray (i.e., Simply Saline) or nasal saline lavage (i.e., NeilMed) is recommended as needed and prior to medicated nasal sprays.   Return in about 4 months (around 06/18/2020), or if symptoms worsen or fail to improve.

## 2020-02-16 NOTE — Assessment & Plan Note (Signed)
   Continue appropriate allergen avoidance measures.  Continue loratadine 10 mg daily as needed.  To avoid diminishing benefit with daily use (tachyphylaxis) of second generation antihistamine, consider alternating every few months between fexofenadine (Allegra) and loratadine (Claritin).  Continue fluticasone nasal spray, 1 to 2 sprays per nostril daily if needed.  Nasal saline spray (i.e., Simply Saline) or nasal saline lavage (i.e., NeilMed) is recommended as needed and prior to medicated nasal sprays.

## 2020-02-16 NOTE — Assessment & Plan Note (Addendum)
Stable.  Continue montelukast 10 mg daily.  Xopenex 1 to 2 inhalations every 4-6 hours if needed.  The patient has agreed to contact us if she has any asthma related problems.  Subjective and objective measures of pulmonary function will be followed and the treatment plan will be adjusted accordingly.

## 2020-02-17 DIAGNOSIS — R922 Inconclusive mammogram: Secondary | ICD-10-CM | POA: Diagnosis not present

## 2020-02-17 DIAGNOSIS — R928 Other abnormal and inconclusive findings on diagnostic imaging of breast: Secondary | ICD-10-CM | POA: Diagnosis not present

## 2020-03-02 DIAGNOSIS — N6315 Unspecified lump in the right breast, overlapping quadrants: Secondary | ICD-10-CM | POA: Diagnosis not present

## 2020-03-02 DIAGNOSIS — D0511 Intraductal carcinoma in situ of right breast: Secondary | ICD-10-CM | POA: Diagnosis not present

## 2020-03-02 DIAGNOSIS — N6312 Unspecified lump in the right breast, upper inner quadrant: Secondary | ICD-10-CM | POA: Diagnosis not present

## 2020-03-04 ENCOUNTER — Encounter: Payer: Self-pay | Admitting: *Deleted

## 2020-03-04 DIAGNOSIS — D0511 Intraductal carcinoma in situ of right breast: Secondary | ICD-10-CM | POA: Insufficient documentation

## 2020-03-05 ENCOUNTER — Encounter: Payer: Self-pay | Admitting: Internal Medicine

## 2020-03-09 DIAGNOSIS — L7682 Other postprocedural complications of skin and subcutaneous tissue: Secondary | ICD-10-CM | POA: Diagnosis not present

## 2020-03-09 DIAGNOSIS — Z170 Estrogen receptor positive status [ER+]: Secondary | ICD-10-CM | POA: Diagnosis not present

## 2020-03-09 DIAGNOSIS — D0511 Intraductal carcinoma in situ of right breast: Secondary | ICD-10-CM | POA: Diagnosis not present
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
## 2020-03-10 ENCOUNTER — Encounter: Payer: Self-pay | Admitting: Genetic Counselor

## 2020-03-10 ENCOUNTER — Other Ambulatory Visit: Payer: Self-pay

## 2020-03-10 ENCOUNTER — Encounter: Payer: Self-pay | Admitting: Oncology

## 2020-03-10 ENCOUNTER — Ambulatory Visit: Payer: PPO | Admitting: Physical Therapy

## 2020-03-10 ENCOUNTER — Inpatient Hospital Stay: Payer: PPO | Attending: Oncology | Admitting: Oncology

## 2020-03-10 ENCOUNTER — Ambulatory Visit (HOSPITAL_BASED_OUTPATIENT_CLINIC_OR_DEPARTMENT_OTHER): Payer: PPO | Admitting: Genetic Counselor

## 2020-03-10 ENCOUNTER — Ambulatory Visit
Admission: RE | Admit: 2020-03-10 | Discharge: 2020-03-10 | Disposition: A | Discharge status: Home / Self Care | Payer: PPO | Source: Ambulatory Visit | Attending: Radiation Oncology | Admitting: Radiation Oncology

## 2020-03-10 ENCOUNTER — Encounter: Payer: Self-pay | Admitting: *Deleted

## 2020-03-10 ENCOUNTER — Inpatient Hospital Stay: Payer: PPO

## 2020-03-10 VITALS — BP 118/75 | HR 81 | Temp 98.1°F | Resp 17 | Ht 65.0 in | Wt 184.2 lb

## 2020-03-10 DIAGNOSIS — Z8049 Family history of malignant neoplasm of other genital organs: Secondary | ICD-10-CM | POA: Insufficient documentation

## 2020-03-10 DIAGNOSIS — D0511 Intraductal carcinoma in situ of right breast: Secondary | ICD-10-CM

## 2020-03-10 DIAGNOSIS — Z8042 Family history of malignant neoplasm of prostate: Secondary | ICD-10-CM | POA: Diagnosis not present

## 2020-03-10 DIAGNOSIS — Z803 Family history of malignant neoplasm of breast: Secondary | ICD-10-CM

## 2020-03-10 DIAGNOSIS — Z808 Family history of malignant neoplasm of other organs or systems: Secondary | ICD-10-CM | POA: Insufficient documentation

## 2020-03-10 DIAGNOSIS — Z806 Family history of leukemia: Secondary | ICD-10-CM | POA: Insufficient documentation

## 2020-03-10 DIAGNOSIS — Z800 Family history of malignant neoplasm of digestive organs: Secondary | ICD-10-CM | POA: Insufficient documentation

## 2020-03-10 LAB — CBC WITH DIFFERENTIAL (CANCER CENTER ONLY)
Abs Immature Granulocytes: 0.01 10*3/uL (ref 0.00–0.07)
Basophils Absolute: 0 10*3/uL (ref 0.0–0.1)
Basophils Relative: 1 %
Eosinophils Absolute: 0.2 10*3/uL (ref 0.0–0.5)
Eosinophils Relative: 3 %
HCT: 37.6 % (ref 36.0–46.0)
Hemoglobin: 12.3 g/dL (ref 12.0–15.0)
Immature Granulocytes: 0 %
Lymphocytes Relative: 46 %
Lymphs Abs: 2.5 10*3/uL (ref 0.7–4.0)
MCH: 27.9 pg (ref 26.0–34.0)
MCHC: 32.7 g/dL (ref 30.0–36.0)
MCV: 85.3 fL (ref 80.0–100.0)
Monocytes Absolute: 0.6 10*3/uL (ref 0.1–1.0)
Monocytes Relative: 11 %
Neutro Abs: 2 10*3/uL (ref 1.7–7.7)
Neutrophils Relative %: 39 %
Platelet Count: 239 10*3/uL (ref 150–400)
RBC: 4.41 MIL/uL (ref 3.87–5.11)
RDW: 14.2 % (ref 11.5–15.5)
WBC Count: 5.3 10*3/uL (ref 4.0–10.5)
nRBC: 0 % (ref 0.0–0.2)

## 2020-03-10 LAB — CMP (CANCER CENTER ONLY)
ALT: 37 U/L (ref 0–44)
AST: 26 U/L (ref 15–41)
Albumin: 4 g/dL (ref 3.5–5.0)
Alkaline Phosphatase: 93 U/L (ref 38–126)
Anion gap: 6 (ref 5–15)
BUN: 13 mg/dL (ref 8–23)
CO2: 31 mmol/L (ref 22–32)
Calcium: 9.6 mg/dL (ref 8.9–10.3)
Chloride: 105 mmol/L (ref 98–111)
Creatinine: 0.84 mg/dL (ref 0.44–1.00)
GFR, Estimated: >60 mL/min (ref >60)
Glucose, Bld: 79 mg/dL (ref 70–99)
Potassium: 3.8 mmol/L (ref 3.5–5.1)
Sodium: 142 mmol/L (ref 135–145)
Total Bilirubin: 1 mg/dL (ref 0.3–1.2)
Total Protein: 7.6 g/dL (ref 6.5–8.1)

## 2020-03-10 LAB — GENETIC SCREENING ORDER
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
## 2020-03-11 ENCOUNTER — Ambulatory Visit: Payer: Self-pay | Admitting: General Surgery

## 2020-03-11 ENCOUNTER — Encounter: Payer: Self-pay | Admitting: General Practice

## 2020-03-11 DIAGNOSIS — D0511 Intraductal carcinoma in situ of right breast: Secondary | ICD-10-CM

## 2020-03-11 NOTE — Progress Notes (Signed)
[facility] Psychosocial Distress Screening Spiritual Care  Met with Rachael Osborn by phone following Breast Multidisciplinary Clinic to introduce Support Center team/resources, reviewing distress screen per protocol.  The patient scored a 2 on the Psychosocial Distress Thermometer which indicates mild distress. Also assessed for distress and other psychosocial needs.   ONCBCN DISTRESS SCREENING 03/11/2020  Screening Type Initial Screening  Distress experienced in past week (1-10) 2  Referral to support programs Yes   Rachael Osborn reports that her distress has moved to 0 now that she knows her plan of care.  Follow up needed: No. Rachael Osborn knows to contact Team whenever needed/desired, if questions or concerns arise.   Chaplain Lisa Lundeen, MDiv, [facility] Pager [phone] Voicemail [phone]      

## 2020-03-16 ENCOUNTER — Telehealth: Payer: Self-pay | Admitting: Oncology

## 2020-03-16 NOTE — Telephone Encounter (Signed)
Scheduled per 11/10 los. Called and spoke with the pt, confirmed 1/18 appt

## 2020-03-17 DIAGNOSIS — Z1379 Encounter for other screening for genetic and chromosomal anomalies: Secondary | ICD-10-CM | POA: Insufficient documentation

## 2020-03-18 ENCOUNTER — Telehealth: Payer: Self-pay | Admitting: Genetic Counselor

## 2020-03-18 ENCOUNTER — Encounter: Payer: Self-pay | Admitting: *Deleted

## 2020-03-18 ENCOUNTER — Encounter: Payer: Self-pay | Admitting: Genetic Counselor

## 2020-03-18 ENCOUNTER — Telehealth: Payer: Self-pay | Admitting: *Deleted

## 2020-03-18 ENCOUNTER — Ambulatory Visit: Payer: Self-pay | Admitting: Genetic Counselor

## 2020-03-18 DIAGNOSIS — D0511 Intraductal carcinoma in situ of right breast: Secondary | ICD-10-CM

## 2020-03-18 DIAGNOSIS — Z1379 Encounter for other screening for genetic and chromosomal anomalies: Secondary | ICD-10-CM

## 2020-03-18 NOTE — Telephone Encounter (Signed)
Spoke to pt concerning [place] from 11.10.21. Denies questions or concerns regarding dx or treatment care plan. Encourage pt to call with needs. Received verbal understanding.

## 2020-03-18 NOTE — Telephone Encounter (Signed)
Revealed negative genetic testing. Discussed that we do not know why she has breast cancer or why there is cancer in the family. There could be a genetic mutation in the family that Rachael Osborn did not inherit. There could also be a mutation in a different gene that we are not testing, or our current technology may not be able detect certain mutations. It will therefore be important for her to stay in contact with genetics to keep up with whether additional testing may be appropriate in the future.

## 2020-03-18 NOTE — Progress Notes (Signed)
HPI:  Rachael Osborn was previously seen in the [facility] due to a personal and family history of cancer and concerns regarding a hereditary predisposition to cancer. Please refer to our prior cancer genetics clinic note for more information regarding our discussion, assessment and recommendations, at the time. Rachael Osborn recent genetic test results were disclosed to her, as were recommendations warranted by these results. These results and recommendations are discussed in more detail below.  CANCER HISTORY:  Oncology History  Ductal carcinoma in situ (DCIS) of right breast  03/04/2020 Initial Diagnosis   Ductal carcinoma in situ (DCIS) of right breast   03/17/2020 Genetic Testing   Negative genetic testing:  No pathogenic variants detected on the [facility] Breast Cancer STAT panel + Common Hereditary Cancers panel. The report date is 03/17/2020.   The Breast Cancer STAT Panel offered by [facility] includes sequencing and deletion/duplication analysis for the following 9 genes:  ATM, BRCA1, BRCA2, CDH1, CHEK2, PALB2, PTEN, STK11 and TP53. The Common Hereditary Cancers Panel offered by [facility] includes sequencing and/or deletion duplication testing of the following 48 genes: APC, ATM, AXIN2, BARD1, BMPR1A, BRCA1, BRCA2, BRIP1, CDH1, CDK4, CDKN2A (p14ARF), CDKN2A (p16INK4a), CHEK2, CTNNA1, DICER1, EPCAM (Deletion/duplication testing only), GREM1 (promoter region deletion/duplication testing only), KIT, MEN1, MLH1, MSH2, MSH3, MSH6, MUTYH, NBN, NF1, NTHL1, PALB2, PDGFRA, PMS2, POLD1, POLE, PTEN, RAD50, RAD51C, RAD51D, RNF43, SDHB, SDHC, SDHD, SMAD4, SMARCA4. STK11, TP53, TSC1, TSC2, and VHL.  The following genes were evaluated for sequence changes only: SDHA and HOXB13 c.251G>A variant only.     FAMILY HISTORY:  We obtained a detailed, 4-generation family history.  Significant diagnoses are listed below: Family History  Problem Relation Age of Onset  . Breast cancer Mother 39  .  Cervical cancer Mother 74  . Prostate cancer Father 68       metastatic  . Prostate cancer Brother 60  . Breast cancer Paternal Aunt 85  . Cancer Sister 66       gallbladder cancer  . Melanoma Sister 20  . Prostate cancer Half-Brother        dx 58s  . Liver cancer Half-Brother        dx 50s  . Cancer Maternal Uncle        maybe prostate? dx >50  . Leukemia Niece        dx early 62s, acute  . Cancer Cousin        unknown type, dx 78s (maternal first cousin)  . Allergic rhinitis Neg Hx   . Angioedema Neg Hx   . Asthma Neg Hx   . Eczema Neg Hx   . Immunodeficiency Neg Hx    Rachael Osborn has one son (age 15). She has five full-brothers, four full-sisters, one paternal half-brother, and one paternal half-sister. One full-brother was diagnosed with prostate cancer at the age of 84. A full-sister was diagnosed with gallbladder cancer at the age of 70. Another full-sister was diagnosed with melanoma at the age of 22. Her half-brother was diagnosed with liver cancer and prostate cancer in his 94s. One niece died from leukemia in her early 30s.   Rachael Osborn mother died at the age of 74 and had breast cancer (diagnosed age 53) and also had a history of cervical cancer diagnosed at the age of 63. Rachael Osborn had one maternal aunt and four maternal uncles. Most died from old age. She believes one maternal uncle had prostate cancer diagnosed older than 6. One maternal cousin was diagnosed  with cancer (unknown type) in his 35s. Her maternal grandparents died in their 33s and did not have cancer.  Rachael Osborn father died at the age of 16 from metastatic prostate cancer (first diagnosed around age 62). She had one paternal aunt who was diagnosed with breast cancer at the age of 75. Her paternal grandmother died in her 59s, and her paternal grandfather died in his 86s.  Rachael Osborn is unaware of previous family history of genetic testing for hereditary cancer risks. Patient's ancestors are of unknown  descent. There is no reported Ashkenazi Jewish ancestry. There is no known consanguinity.  GENETIC TEST RESULTS: Genetic testing reported out on 03/17/2020 through the [facility] Breast Cancer STAT + Common Hereditary Cancers panel. No pathogenic variants were detected.   The Breast Cancer STAT Panel offered by [facility] includes sequencing and deletion/duplication analysis for the following 9 genes:  ATM, BRCA1, BRCA2, CDH1, CHEK2, PALB2, PTEN, STK11 and TP53. The Common Hereditary Cancers Panel offered by [facility] includes sequencing and/or deletion duplication testing of the following 48 genes: APC, ATM, AXIN2, BARD1, BMPR1A, BRCA1, BRCA2, BRIP1, CDH1, CDK4, CDKN2A (p14ARF), CDKN2A (p16INK4a), CHEK2, CTNNA1, DICER1, EPCAM (Deletion/duplication testing only), GREM1 (promoter region deletion/duplication testing only), KIT, MEN1, MLH1, MSH2, MSH3, MSH6, MUTYH, NBN, NF1, NTHL1, PALB2, PDGFRA, PMS2, POLD1, POLE, PTEN, RAD50, RAD51C, RAD51D, RNF43, SDHB, SDHC, SDHD, SMAD4, SMARCA4. STK11, TP53, TSC1, TSC2, and VHL.  The following genes were evaluated for sequence changes only: SDHA and HOXB13 c.251G>A variant only. The test report will be scanned into EPIC and located under the Molecular Pathology section of the Results Review tab.  A portion of the result report is included below for reference.     We discussed with Rachael Osborn that because current genetic testing is not perfect, it is possible there may be a gene mutation in one of these genes that current testing cannot detect, but that chance is small.  We also discussed that there could be another gene that has not yet been discovered, or that we have not yet tested, that is responsible for the cancer diagnoses in the family. It is also possible there is a hereditary cause for the cancer in the family that Rachael Osborn did not inherit and therefore was not identified in her testing.  Therefore, it is important to remain in touch with cancer genetics in the future  so that we can continue to offer Rachael Osborn the most up to date genetic testing.   CANCER SCREENING RECOMMENDATIONS: Rachael Osborn test result is considered negative (normal).  This means that we have not identified a hereditary cause for her personal and family history of cancer at this time. While reassuring, this does not definitively rule out a hereditary predisposition to cancer. It is still possible that there could be genetic mutations that are undetectable by current technology. There could be genetic mutations in genes that have not been tested or identified to increase cancer risk.  Therefore, it is recommended she continue to follow the cancer management and screening guidelines provided by her oncology and primary healthcare provider.   An individual's cancer risk and medical management are not determined by genetic test results alone. Overall cancer risk assessment incorporates additional factors, including personal medical history, family history, and any available genetic information that may result in a personalized plan for cancer prevention and surveillance.  RECOMMENDATIONS FOR FAMILY MEMBERS:  Individuals in this family might be at some increased risk of developing cancer, over the general population risk, simply due to  the family history of cancer.  We recommended women in this family have a yearly mammogram beginning at age 63, or 59 years younger than the earliest onset of cancer, an annual clinical breast exam, and perform monthly breast self-exams. Women in this family should also have a gynecological exam as recommended by their primary provider. All family members should be referred for colonoscopy starting at age 17.  It is also possible there is a hereditary cause for the cancer in Rachael Osborn's family that she did not inherit and therefore was not identified in her.  Based on Rachael Osborn's family history, we recommended her siblings have genetic counseling and testing. Rachael Osborn  can let us know if we can be of any assistance in coordinating genetic counseling and/or testing for this family member.   FOLLOW-UP: Lastly, we discussed with Rachael Osborn that cancer genetics is a rapidly advancing field and it is possible that new genetic tests will be appropriate for her and/or her family members in the future. We encouraged her to remain in contact with cancer genetics on an annual basis so we can update her personal and family histories and let her know of advances in cancer genetics that may benefit this family.   Our contact number was provided. Rachael Osborn questions were answered to her satisfaction, and she knows she is welcome to call us at anytime with additional questions or concerns.   Clint Guy, MS, [facility] Genetic Counselor [email or web address] Phone: [phone]

## 2020-03-24 NOTE — Progress Notes (Signed)
Nutrition  Patient identified by attending Breast Clinic on 03/10/20.  Patient was given nutrition packet with RD contact information by nurse navigator.  Chart reviewed.   64 year old female with new diagnosis of right breast cancer.  Planning lumpectomy followed by radiation and then discussion of antiestrogens.    Ht: 65 inches Wt: 184 lb BMI: 30  Patient is currently not at nutritional risk.  Please consult RD if changes in nutritional status occur.  Tandi Hanko B. Zenia Resides, [place], [place] Registered Dietitian [phone] (mobile)

## 2020-03-30 ENCOUNTER — Encounter: Payer: Self-pay | Admitting: *Deleted

## 2020-04-05 ENCOUNTER — Encounter (HOSPITAL_BASED_OUTPATIENT_CLINIC_OR_DEPARTMENT_OTHER): Payer: Self-pay | Admitting: General Surgery

## 2020-04-05 ENCOUNTER — Other Ambulatory Visit: Payer: Self-pay

## 2020-04-05 NOTE — Progress Notes (Signed)
Patient sees Dr. Ola Spurr for history of SVT and ablation in 2018. Patient denies any recent chest pain or SOB. LOV with cardiologist on 11/25/19 in care everywhere. Chart reviewed with Dr. Elgie Congo. No further cardiac clearance needed for breast surgery. Ok to proceed as scheduled.

## 2020-04-06 ENCOUNTER — Encounter (HOSPITAL_BASED_OUTPATIENT_CLINIC_OR_DEPARTMENT_OTHER)
Admission: RE | Admit: 2020-04-06 | Discharge: 2020-04-06 | Disposition: A | Discharge status: Home / Self Care | Payer: PPO | Source: Ambulatory Visit | Attending: General Surgery | Admitting: General Surgery

## 2020-04-06 ENCOUNTER — Other Ambulatory Visit (HOSPITAL_COMMUNITY)
Admission: RE | Admit: 2020-04-06 | Discharge: 2020-04-06 | Disposition: A | Discharge status: Home / Self Care | Payer: PPO | Source: Ambulatory Visit | Attending: General Surgery | Admitting: General Surgery

## 2020-04-06 DIAGNOSIS — Z01812 Encounter for preprocedural laboratory examination: Secondary | ICD-10-CM | POA: Diagnosis not present

## 2020-04-06 DIAGNOSIS — Z01818 Encounter for other preprocedural examination: Secondary | ICD-10-CM | POA: Insufficient documentation

## 2020-04-06 DIAGNOSIS — Z20822 Contact with and (suspected) exposure to covid-19: Secondary | ICD-10-CM | POA: Insufficient documentation

## 2020-04-06 DIAGNOSIS — I10 Essential (primary) hypertension: Secondary | ICD-10-CM | POA: Insufficient documentation

## 2020-04-06 LAB — BASIC METABOLIC PANEL
Anion gap: 9 (ref 5–15)
BUN: 27 mg/dL — ABNORMAL HIGH (ref 8–23)
CO2: 27 mmol/L (ref 22–32)
Calcium: 9.4 mg/dL (ref 8.9–10.3)
Chloride: 102 mmol/L (ref 98–111)
Creatinine, Ser: 0.74 mg/dL (ref 0.44–1.00)
GFR, Estimated: >60 mL/min (ref >60)
Glucose, Bld: 98 mg/dL (ref 70–99)
Potassium: 4.1 mmol/L (ref 3.5–5.1)
Sodium: 138 mmol/L (ref 135–145)

## 2020-04-06 LAB — SARS CORONAVIRUS 2 (TAT 6-24 HRS): SARS Coronavirus 2: NEGATIVE

## 2020-04-06 NOTE — Progress Notes (Addendum)

## 2020-04-08 DIAGNOSIS — C50211 Malignant neoplasm of upper-inner quadrant of right female breast: Secondary | ICD-10-CM | POA: Diagnosis not present

## 2020-04-09 ENCOUNTER — Encounter (HOSPITAL_BASED_OUTPATIENT_CLINIC_OR_DEPARTMENT_OTHER): Payer: Self-pay | Admitting: General Surgery

## 2020-04-09 ENCOUNTER — Ambulatory Visit (HOSPITAL_BASED_OUTPATIENT_CLINIC_OR_DEPARTMENT_OTHER)
Admission: RE | Admit: 2020-04-09 | Discharge: 2020-04-09 | Disposition: A | Discharge status: Home / Self Care | Payer: PPO | Attending: General Surgery | Admitting: General Surgery

## 2020-04-09 ENCOUNTER — Encounter (HOSPITAL_BASED_OUTPATIENT_CLINIC_OR_DEPARTMENT_OTHER)
Admission: RE | Disposition: A | Discharge status: Home / Self Care | Payer: Self-pay | Source: Home / Self Care | Attending: General Surgery

## 2020-04-09 ENCOUNTER — Ambulatory Visit (HOSPITAL_BASED_OUTPATIENT_CLINIC_OR_DEPARTMENT_OTHER): Payer: PPO | Admitting: Certified Registered Nurse Anesthetist

## 2020-04-09 ENCOUNTER — Other Ambulatory Visit: Payer: Self-pay

## 2020-04-09 DIAGNOSIS — Z803 Family history of malignant neoplasm of breast: Secondary | ICD-10-CM | POA: Diagnosis not present

## 2020-04-09 DIAGNOSIS — I10 Essential (primary) hypertension: Secondary | ICD-10-CM | POA: Diagnosis not present

## 2020-04-09 DIAGNOSIS — K219 Gastro-esophageal reflux disease without esophagitis: Secondary | ICD-10-CM | POA: Diagnosis not present

## 2020-04-09 DIAGNOSIS — C50211 Malignant neoplasm of upper-inner quadrant of right female breast: Secondary | ICD-10-CM | POA: Diagnosis not present

## 2020-04-09 DIAGNOSIS — E785 Hyperlipidemia, unspecified: Secondary | ICD-10-CM | POA: Diagnosis not present

## 2020-04-09 DIAGNOSIS — D0511 Intraductal carcinoma in situ of right breast: Secondary | ICD-10-CM | POA: Diagnosis not present

## 2020-04-09 DIAGNOSIS — J4540 Moderate persistent asthma, uncomplicated: Secondary | ICD-10-CM | POA: Diagnosis not present

## 2020-04-09 HISTORY — DX: Supraventricular tachycardia: I47.1

## 2020-04-09 HISTORY — PX: BREAST LUMPECTOMY WITH RADIOACTIVE SEED LOCALIZATION: SHX6424

## 2020-04-09 HISTORY — DX: Supraventricular tachycardia, unspecified: I47.10

## 2020-04-09 HISTORY — DX: Gastro-esophageal reflux disease without esophagitis: K21.9

## 2020-04-09 HISTORY — DX: Anxiety disorder, unspecified: F41.9

## 2020-04-09 SURGERY — BREAST LUMPECTOMY WITH RADIOACTIVE SEED LOCALIZATION
Anesthesia: General | Site: Breast | Laterality: Right

## 2020-04-09 MED ORDER — CHLORHEXIDINE GLUCONATE CLOTH 2 % EX PADS: 6.0000 | MEDICATED_PAD | Freq: Once | CUTANEOUS | Status: DC

## 2020-04-09 MED ORDER — CELECOXIB 200 MG PO CAPS
200.0000 mg | ORAL_CAPSULE | ORAL | Status: AC
  Administered 2020-04-09: 200 mg via ORAL

## 2020-04-09 MED ORDER — GABAPENTIN 300 MG PO CAPS
ORAL_CAPSULE | ORAL | Status: AC
  Filled 2020-04-09: qty 1

## 2020-04-09 MED ORDER — HYDROCODONE-ACETAMINOPHEN 5-325 MG PO TABS: 1.0000 | ORAL_TABLET | Freq: Four times a day (QID) | ORAL | 0 refills | Status: DC | PRN

## 2020-04-09 MED ORDER — ACETAMINOPHEN 500 MG PO TABS
1000.0000 mg | ORAL_TABLET | ORAL | Status: AC
  Administered 2020-04-09: 1000 mg via ORAL

## 2020-04-09 MED ORDER — FENTANYL CITRATE (PF) 100 MCG/2ML IJ SOLN
INTRAMUSCULAR | Status: AC
  Filled 2020-04-09: qty 2

## 2020-04-09 MED ORDER — ONDANSETRON HCL 4 MG/2ML IJ SOLN
INTRAMUSCULAR | Status: AC
  Filled 2020-04-09: qty 2

## 2020-04-09 MED ORDER — BUPIVACAINE HCL (PF) 0.25 % IJ SOLN
INTRAMUSCULAR | Status: DC | PRN
  Administered 2020-04-09: 20 mL

## 2020-04-09 MED ORDER — FENTANYL CITRATE (PF) 100 MCG/2ML IJ SOLN: 25.0000 ug | INTRAMUSCULAR | Status: DC | PRN

## 2020-04-09 MED ORDER — ACETAMINOPHEN 500 MG PO TABS
ORAL_TABLET | ORAL | Status: AC
  Filled 2020-04-09: qty 2

## 2020-04-09 MED ORDER — DEXAMETHASONE SODIUM PHOSPHATE 10 MG/ML IJ SOLN
INTRAMUSCULAR | Status: DC | PRN
  Administered 2020-04-09: 4 mg via INTRAVENOUS

## 2020-04-09 MED ORDER — CEFAZOLIN SODIUM-DEXTROSE 2-4 GM/100ML-% IV SOLN
INTRAVENOUS | Status: AC
  Filled 2020-04-09: qty 100

## 2020-04-09 MED ORDER — GABAPENTIN 300 MG PO CAPS: 300.0000 mg | ORAL_CAPSULE | ORAL | Status: DC

## 2020-04-09 MED ORDER — FENTANYL CITRATE (PF) 100 MCG/2ML IJ SOLN
INTRAMUSCULAR | Status: DC | PRN
  Administered 2020-04-09: 50 ug via INTRAVENOUS

## 2020-04-09 MED ORDER — LIDOCAINE 2% (20 MG/ML) 5 ML SYRINGE
INTRAMUSCULAR | Status: DC | PRN
  Administered 2020-04-09: 60 mg via INTRAVENOUS

## 2020-04-09 MED ORDER — PROPOFOL 10 MG/ML IV BOLUS
INTRAVENOUS | Status: DC | PRN
  Administered 2020-04-09: 200 mg via INTRAVENOUS

## 2020-04-09 MED ORDER — CELECOXIB 200 MG PO CAPS
ORAL_CAPSULE | ORAL | Status: AC
  Filled 2020-04-09: qty 1

## 2020-04-09 MED ORDER — PHENYLEPHRINE 40 MCG/ML (10ML) SYRINGE FOR IV PUSH (FOR BLOOD PRESSURE SUPPORT)
PREFILLED_SYRINGE | INTRAVENOUS | Status: DC | PRN
  Administered 2020-04-09 (×2): 120 ug via INTRAVENOUS

## 2020-04-09 MED ORDER — CEFAZOLIN SODIUM-DEXTROSE 2-4 GM/100ML-% IV SOLN
2.0000 g | INTRAVENOUS | Status: AC
  Administered 2020-04-09: 2 g via INTRAVENOUS

## 2020-04-09 MED ORDER — LIDOCAINE 2% (20 MG/ML) 5 ML SYRINGE
INTRAMUSCULAR | Status: AC
  Filled 2020-04-09: qty 5

## 2020-04-09 MED ORDER — EPHEDRINE SULFATE-NACL 50-0.9 MG/10ML-% IV SOSY
PREFILLED_SYRINGE | INTRAVENOUS | Status: DC | PRN
  Administered 2020-04-09 (×2): 5 mg via INTRAVENOUS

## 2020-04-09 MED ORDER — LACTATED RINGERS IV SOLN: INTRAVENOUS | Status: DC

## 2020-04-09 MED ORDER — PROPOFOL 10 MG/ML IV BOLUS
INTRAVENOUS | Status: AC
  Filled 2020-04-09: qty 20

## 2020-04-09 MED ORDER — ONDANSETRON HCL 4 MG/2ML IJ SOLN
INTRAMUSCULAR | Status: DC | PRN
  Administered 2020-04-09: 4 mg via INTRAVENOUS

## 2020-04-09 SURGICAL SUPPLY — 44 items
ADH SKN CLS APL DERMABOND .7 (GAUZE/BANDAGES/DRESSINGS) ×1
APL PRP STRL LF DISP 70% ISPRP (MISCELLANEOUS) ×1
APPLIER CLIP 9.375 MED OPEN (MISCELLANEOUS) ×3
APR CLP MED 9.3 20 MLT OPN (MISCELLANEOUS) ×1
BLADE SURG 15 STRL LF DISP TIS (BLADE) ×1 IMPLANT
BLADE SURG 15 STRL SS (BLADE) ×6
CANISTER SUC SOCK COL 7IN (MISCELLANEOUS) ×1 IMPLANT
CANISTER SUCT 1200ML W/VALVE (MISCELLANEOUS) ×3 IMPLANT
CHLORAPREP W/TINT 26 (MISCELLANEOUS) ×3 IMPLANT
CLIP APPLIE 9.375 MED OPEN (MISCELLANEOUS) IMPLANT
COVER BACK TABLE 60X90IN (DRAPES) ×3 IMPLANT
COVER MAYO STAND STRL (DRAPES) ×3 IMPLANT
COVER PROBE W GEL 5X96 (DRAPES) ×3 IMPLANT
COVER WAND RF STERILE (DRAPES) IMPLANT
DECANTER SPIKE VIAL GLASS SM (MISCELLANEOUS) IMPLANT
DERMABOND ADVANCED (GAUZE/BANDAGES/DRESSINGS) ×2
DERMABOND ADVANCED .7 DNX12 (GAUZE/BANDAGES/DRESSINGS) ×1 IMPLANT
DRAPE LAPAROSCOPIC ABDOMINAL (DRAPES) ×3 IMPLANT
DRAPE UTILITY XL STRL (DRAPES) ×3 IMPLANT
ELECT COATED BLADE 2.86 ST (ELECTRODE) ×3 IMPLANT
ELECT REM PT RETURN 9FT ADLT (ELECTROSURGICAL) ×3
ELECTRODE REM PT RTRN 9FT ADLT (ELECTROSURGICAL) ×1 IMPLANT
GLOVE BIO SURGEON STRL SZ7.5 (GLOVE) ×8 IMPLANT
GOWN STRL REUS W/ TWL LRG LVL3 (GOWN DISPOSABLE) ×2 IMPLANT
GOWN STRL REUS W/TWL LRG LVL3 (GOWN DISPOSABLE) ×6
ILLUMINATOR WAVEGUIDE N/F (MISCELLANEOUS) IMPLANT
KIT MARKER MARGIN INK (KITS) ×3 IMPLANT
LIGHT WAVEGUIDE WIDE FLAT (MISCELLANEOUS) IMPLANT
NDL HYPO 25X1 1.5 SAFETY (NEEDLE) IMPLANT
NEEDLE HYPO 25X1 1.5 SAFETY (NEEDLE) ×3 IMPLANT
NS IRRIG 1000ML POUR BTL (IV SOLUTION) IMPLANT
PACK BASIN DAY SURGERY FS (CUSTOM PROCEDURE TRAY) ×3 IMPLANT
PENCIL SMOKE EVACUATOR (MISCELLANEOUS) ×3 IMPLANT
SLEEVE SCD COMPRESS KNEE MED (MISCELLANEOUS) ×3 IMPLANT
SPONGE LAP 18X18 RF (DISPOSABLE) ×3 IMPLANT
SUT MON AB 4-0 PC3 18 (SUTURE) ×3 IMPLANT
SUT SILK 2 0 SH (SUTURE) IMPLANT
SUT VICRYL 3-0 CR8 SH (SUTURE) ×3 IMPLANT
SYR CONTROL 10ML LL (SYRINGE) IMPLANT
TOWEL GREEN STERILE FF (TOWEL DISPOSABLE) ×3 IMPLANT
TRAY FAXITRON CT DISP (TRAY / TRAY PROCEDURE) ×3 IMPLANT
TUBE CONNECTING 20'X1/4 (TUBING) ×1
TUBE CONNECTING 20X1/4 (TUBING) ×2 IMPLANT
YANKAUER SUCT BULB TIP NO VENT (SUCTIONS) ×2 IMPLANT

## 2020-04-09 NOTE — Anesthesia Postprocedure Evaluation (Signed)
Anesthesia Post Note  Patient: Rachael Osborn  Procedure(s) Performed: RIGHT BREAST LUMPECTOMY WITH RADIOACTIVE SEED LOCALIZATION (Right Breast)     Patient location during evaluation: PACU Anesthesia Type: General Level of consciousness: awake Pain management: pain level controlled Vital Signs Assessment: post-procedure vital signs reviewed and stable Respiratory status: spontaneous breathing Cardiovascular status: stable Postop Assessment: no apparent nausea or vomiting Anesthetic complications: no   No complications documented.  Last Vitals:  Vitals:   04/09/20 1140 04/09/20 1210  BP:  124/83  Pulse: 74 82  Resp: 14 18  Temp:  36.7 C  SpO2: 98% 100%    Last Pain:  Vitals:   04/09/20 1210  TempSrc:   PainSc: 0-No pain                 Sriya Kroeze

## 2020-04-09 NOTE — Op Note (Signed)
04/09/2020  11:14 AM  PATIENT:  Rachael Osborn  64 y.o. female  PRE-OPERATIVE DIAGNOSIS:  RIGHT BREAST DUCTAL CARCINOMA IN SITU  POST-OPERATIVE DIAGNOSIS:  RIGHT BREAST DUCTAL CARCINOMA IN SITU  PROCEDURE:  Procedure(s): RIGHT BREAST LUMPECTOMY WITH RADIOACTIVE SEED LOCALIZATION (Right)  SURGEON:  Surgeon(s) and Role:    * Jovita Kussmaul, MD - Primary  PHYSICIAN ASSISTANT:   ASSISTANTS: none   ANESTHESIA:   local and general  EBL:  minimal   BLOOD ADMINISTERED:none  DRAINS: none   LOCAL MEDICATIONS USED:  MARCAINE     SPECIMEN:  Source of Specimen:  right breast tissue  DISPOSITION OF SPECIMEN:  PATHOLOGY  COUNTS:  YES  TOURNIQUET:  * No tourniquets in log *  DICTATION: .Dragon Dictation   After informed consent was obtained the patient was brought to the operating room and placed in the supine position on the operating table.  After adequate induction of general anesthesia the patient's right breast was prepped with ChloraPrep, allowed to dry, and draped in usual sterile manner.  An appropriate timeout was performed.  Previously an I-125 seed was placed in the upper portion of the right breast to mark an area of ductal carcinoma in situ.  The neoprobe was set to I-125 in the area of radioactivity was readily identified.  The area around this was infiltrated with quarter percent Marcaine.  A curvilinear incision was made along the upper edge of the areola of the right breast with a 15 blade knife.  The incision was carried through the skin and subcutaneous tissue sharply with the electrocautery.  Dissection was carried out throughout the upper portion of the breast between the breast tissue and the subcutaneous fat.  Once the dissection was well beyond the area of the radioactive seed I then removed a circular portion of breast tissue sharply around the radioactive seed while checking the area of radioactivity frequently.  Once the specimen was removed it was oriented with the  appropriate paint colors.  A specimen radiograph was obtained that showed the clip and seed to be near the center of the specimen.  The specimen was then sent to pathology for further evaluation.  Hemostasis was achieved using the Bovie electrocautery.  The dissection was carried all the way to the chest wall.  The wound was then irrigated with saline and infiltrated with more quarter percent Marcaine.  The cavity was marked with clips.  The deep layer of the wound was then closed with layers of interrupted 3-0 Vicryl stitches.  The skin was then closed with interrupted 4-0 Monocryl subcuticular stitches.  Dermabond dressings were applied.  The patient tolerated the procedure well.  At the end of the case all needle sponge and instrument counts were correct.  The patient was then awakened and taken recovery in stable condition.  PLAN OF CARE: Discharge to home after PACU  PATIENT DISPOSITION:  PACU - hemodynamically stable.   Delay start of Pharmacological VTE agent (>24hrs) due to surgical blood loss or risk of bleeding: not applicable

## 2020-04-09 NOTE — Discharge Instructions (Signed)
°  Post Anesthesia Home Care Instructions  Activity: Get plenty of rest for the remainder of the day. A responsible individual must stay with you for 24 hours following the procedure.  For the next 24 hours, DO NOT: -Drive a car -Paediatric nurse -Drink alcoholic beverages -Take any medication unless instructed by your physician -Make any legal decisions or sign important papers.  Meals: Start with liquid foods such as gelatin or soup. Progress to regular foods as tolerated. Avoid greasy, spicy, heavy foods. If nausea and/or vomiting occur, drink only clear liquids until the nausea and/or vomiting subsides. Call your physician if vomiting continues.  Special Instructions/Symptoms: Your throat may feel dry or sore from the anesthesia or the breathing tube placed in your throat during surgery. If this causes discomfort, gargle with warm salt water. The discomfort should disappear within 24 hours.  If you had a scopolamine patch placed behind your ear for the management of post- operative nausea and/or vomiting:  1. The medication in the patch is effective for 72 hours, after which it should be removed.  Wrap patch in a tissue and discard in the trash. Wash hands thoroughly with soap and water. 2. You may remove the patch earlier than 72 hours if you experience unpleasant side effects which may include dry mouth, dizziness or visual disturbances. 3. Avoid touching the patch. Wash your hands with soap and water after contact with the patch.     No Tylenol until 3:15.

## 2020-04-09 NOTE — Anesthesia Procedure Notes (Signed)
Procedure Name: LMA Insertion °Performed by: Kingsley Farace H, CRNA °Pre-anesthesia Checklist: Patient identified, Emergency Drugs available, Suction available and Patient being monitored °Patient Re-evaluated:Patient Re-evaluated prior to induction °Oxygen Delivery Method: Circle System Utilized °Preoxygenation: Pre-oxygenation with 100% oxygen °Induction Type: IV induction °Ventilation: Mask ventilation without difficulty °LMA: LMA inserted °LMA Size: 4.0 °Number of attempts: 1 °Airway Equipment and Method: Bite block °Placement Confirmation: positive ETCO2 °Tube secured with: Tape °Dental Injury: Teeth and Oropharynx as per pre-operative assessment  ° ° ° ° ° ° °

## 2020-04-09 NOTE — H&P (Signed)
Rachael Osborn  Location: [facility] Surgery Patient #: [id number] DOB: 1956/01/07 Undefined / Language: Cleophus Molt / Race: Black or African American Female   History of Present Illness  The patient is a 64 year old female who presents with breast cancer.We are asked to see the patient in consultation by Dr. Jana Hakim to evaluate her for a new right breast cancer. The patient is a 64 year old female who presents with an assymetry in the upper right breast that measured 1.5cm. This was biopsied and came back as high grade dcis that was weakly ER+ and PR-. she is otherwise in good health and does not smoke   Past Surgical History  Cesarean Section - 1  Knee Surgery  Bilateral. Spinal Surgery - Neck   Diagnostic Studies History Colonoscopy  1-5 years ago Mammogram  within last year Pap Smear  1-5 years ago  Medication History  Medications Reconciled  Social History Alcohol use  Occasional alcohol use. No caffeine use  No drug use  Tobacco use  Never smoker.  Family History  Arthritis  Father, Mother. Bleeding disorder  Sister. Breast Cancer  Family Members In General, Mother. Cervical Cancer  Mother. Colon Polyps  Father, Mother. Diabetes Mellitus  Brother, Sister. Hypertension  Father, Mother. Ischemic Bowel Disease  Mother. Kidney Disease  Father. Melanoma  Sister. Prostate Cancer  Brother, Father.  Pregnancy / Birth History  Age at menarche  62 years. Age of menopause  15-50 Gravida  4 Irregular periods  Maternal age  30-40 Para  1  Other Problems  Asthma  Atrial Fibrillation  Back Pain  Gastroesophageal Reflux Disease  High blood pressure  Hypercholesterolemia     Review of Systems  General Not Present- Appetite Loss, Chills, Fatigue, Fever, Night Sweats, Weight Gain and Weight Loss. Skin Present- Rash. Not Present- Change in Wart/Mole, Dryness, Hives, Jaundice, New Lesions, Non-Healing Wounds and Ulcer. HEENT  Present- Seasonal Allergies. Not Present- Earache, Hearing Loss, Hoarseness, Nose Bleed, Oral Ulcers, Ringing in the Ears, Sinus Pain, Sore Throat, Visual Disturbances, Wears glasses/contact lenses and Yellow Eyes. Respiratory Not Present- Bloody sputum, Chronic Cough, Difficulty Breathing, Snoring and Wheezing. Breast Not Present- Breast Mass, Breast Pain, Nipple Discharge and Skin Changes. Cardiovascular Not Present- Chest Pain, Difficulty Breathing Lying Down, Leg Cramps, Palpitations, Rapid Heart Rate, Shortness of Breath and Swelling of Extremities. Gastrointestinal Not Present- Abdominal Pain, Bloating, Bloody Stool, Change in Bowel Habits, Chronic diarrhea, Constipation, Difficulty Swallowing, Excessive gas, Gets full quickly at meals, Hemorrhoids, Indigestion, Nausea, Rectal Pain and Vomiting. Female Genitourinary Not Present- Frequency, Nocturia, Painful Urination, Pelvic Pain and Urgency. Musculoskeletal Present- Back Pain. Not Present- Joint Pain, Joint Stiffness, Muscle Pain, Muscle Weakness and Swelling of Extremities. Neurological Not Present- Decreased Memory, Fainting, Headaches, Numbness, Seizures, Tingling, Tremor, Trouble walking and Weakness. Psychiatric Not Present- Anxiety, Bipolar, Change in Sleep Pattern, Depression, Fearful and Frequent crying. Endocrine Not Present- Cold Intolerance, Excessive Hunger, Hair Changes, Heat Intolerance, Hot flashes and New Diabetes. Hematology Not Present- Blood Thinners, Easy Bruising, Excessive bleeding, Gland problems, HIV and Persistent Infections.   Physical Exam  General Mental Status-Alert. General Appearance-Consistent with stated age. Hydration-Well hydrated. Voice-Normal.  Head and Neck Head-normocephalic, atraumatic with no lesions or palpable masses. Trachea-midline. Thyroid Gland Characteristics - normal size and consistency.  Eye Eyeball - Bilateral-Extraocular movements intact. Sclera/Conjunctiva -  Bilateral-No scleral icterus.  Chest and Lung Exam Chest and lung exam reveals -quiet, even and easy respiratory effort with no use of accessory muscles and on auscultation, normal breath  sounds, no adventitious sounds and normal vocal resonance. Inspection Chest Wall - Normal. Back - normal.  Breast Note: there is no palpable mass in either breast. there is no palpable axillary, supraclavicular, or cervical lymphadenopathy   Cardiovascular Cardiovascular examination reveals -normal heart sounds, regular rate and rhythm with no murmurs and normal pedal pulses bilaterally.  Abdomen Inspection Inspection of the abdomen reveals - No Hernias. Skin - Scar - no surgical scars. Palpation/Percussion Palpation and Percussion of the abdomen reveal - Soft, Non Tender, No Rebound tenderness, No Rigidity (guarding) and No hepatosplenomegaly. Auscultation Auscultation of the abdomen reveals - Bowel sounds normal.  Neurologic Neurologic evaluation reveals -alert and oriented x 3 with no impairment of recent or remote memory. Mental Status-Normal.  Musculoskeletal Normal Exam - Left-Upper Extremity Strength Normal and Lower Extremity Strength Normal. Normal Exam - Right-Upper Extremity Strength Normal and Lower Extremity Strength Normal.  Lymphatic Head & Neck  General Head & Neck Lymphatics: Bilateral - Description - Normal. Axillary  General Axillary Region: Bilateral - Description - Normal. Tenderness - Non Tender. Femoral & Inguinal  Generalized Femoral & Inguinal Lymphatics: Bilateral - Description - Normal. Tenderness - Non Tender.    Assessment & Plan DUCTAL CARCINOMA IN SITU (DCIS) OF RIGHT BREAST (D05.11) Impression: The patient appears to have a 1.5cm area of dcis in the upper right breast. I have discussed with her the different options for surgery and at this point she favors breast conservation which I feel is very reasonable. I have discussed with her in  detail the risks and benefits of the surgery as well as some of the technical aspects including the use of a radioactive seed for localization and she understands and wishes to proceed. This patient encounter took 60 minutes today to perform the following: take history, perform exam, review outside records, interpret imaging, counsel the patient on their diagnosis and document encounter, findings & plan in the EHR Current Plans Referred to Oncology, for evaluation and follow up (Oncology). Routine. Addendum Note She will not need a node evaluation.

## 2020-04-09 NOTE — Transfer of Care (Signed)
Immediate Anesthesia Transfer of Care Note  Patient: Rachael Osborn  Procedure(s) Performed: RIGHT BREAST LUMPECTOMY WITH RADIOACTIVE SEED LOCALIZATION (Right Breast)  Patient Location: PACU  Anesthesia Type:General  Level of Consciousness: awake, alert  and oriented  Airway & Oxygen Therapy: Patient Spontanous Breathing and Patient connected to face mask oxygen  Post-op Assessment: Report given to RN, Post -op Vital signs reviewed and stable and Patient moving all extremities X 4  Post vital signs: Reviewed and stable  Last Vitals:  Vitals Value Taken Time  BP    Temp    Pulse 82 04/09/20 1120  Resp 11 04/09/20 1120  SpO2 100 % 04/09/20 1120  Vitals shown include unvalidated device data.  Last Pain:  Vitals:   04/09/20 0902  TempSrc: Oral  PainSc: 0-No pain         Complications: No complications documented.

## 2020-04-09 NOTE — Interval H&P Note (Signed)
History and Physical Interval Note:  04/09/2020 10:10 AM  Rachael Osborn  has presented today for surgery, with the diagnosis of RIGHT BREAST DUCTAL CARCINOMA IN SITU.  The various methods of treatment have been discussed with the patient and family. After consideration of risks, benefits and other options for treatment, the patient has consented to  Procedure(s): RIGHT BREAST LUMPECTOMY WITH RADIOACTIVE SEED LOCALIZATION (Right) as a surgical intervention.  The patient's history has been reviewed, patient examined, no change in status, stable for surgery.  I have reviewed the patient's chart and labs.  Questions were answered to the patient's satisfaction.     [name]

## 2020-04-09 NOTE — Anesthesia Preprocedure Evaluation (Signed)
Anesthesia Evaluation  Patient identified by MRN, date of birth, ID band Patient awake    Reviewed: Allergy & Precautions, NPO status , Patient's Chart, lab work & pertinent test results  Airway Mallampati: II  TM Distance: >3 FB     Dental   Pulmonary asthma , sleep apnea ,    breath sounds clear to auscultation       Cardiovascular hypertension,  Rhythm:Regular Rate:Normal     Neuro/Psych Anxiety    GI/Hepatic Neg liver ROS, GERD  ,  Endo/Other  negative endocrine ROS  Renal/GU negative Renal ROS     Musculoskeletal  (+) Arthritis ,   Abdominal   Peds  Hematology   Anesthesia Other Findings   Reproductive/Obstetrics                             Anesthesia Physical Anesthesia Plan  ASA: III  Anesthesia Plan: General   Post-op Pain Management:    Induction: Intravenous  PONV Risk Score and Plan: 3 and Ondansetron, Dexamethasone and Midazolam  Airway Management Planned: LMA  Additional Equipment:   Intra-op Plan:   Post-operative Plan: Extubation in OR  Informed Consent: I have reviewed the patients History and Physical, chart, labs and discussed the procedure including the risks, benefits and alternatives for the proposed anesthesia with the patient or authorized representative who has indicated his/her understanding and acceptance.       Plan Discussed with: CRNA and Anesthesiologist  Anesthesia Plan Comments:         Anesthesia Quick Evaluation

## 2020-04-12 ENCOUNTER — Encounter (HOSPITAL_BASED_OUTPATIENT_CLINIC_OR_DEPARTMENT_OTHER): Payer: Self-pay | Admitting: General Surgery

## 2020-04-12 NOTE — Addendum Note (Signed)
Addendum  created 04/12/20 1028 by Tawni Millers, CRNA   Charge Capture section accepted

## 2020-04-13 LAB — SURGICAL PATHOLOGY

## 2020-05-06 ENCOUNTER — Encounter: Payer: Self-pay | Admitting: Radiation Oncology

## 2020-05-06 ENCOUNTER — Other Ambulatory Visit: Payer: Self-pay

## 2020-05-10 DIAGNOSIS — I10 Essential (primary) hypertension: Secondary | ICD-10-CM | POA: Diagnosis not present

## 2020-05-10 DIAGNOSIS — E785 Hyperlipidemia, unspecified: Secondary | ICD-10-CM | POA: Diagnosis not present

## 2020-05-10 DIAGNOSIS — Z5181 Encounter for therapeutic drug level monitoring: Secondary | ICD-10-CM | POA: Diagnosis not present

## 2020-05-10 DIAGNOSIS — R7303 Prediabetes: Secondary | ICD-10-CM | POA: Diagnosis not present

## 2020-05-11 ENCOUNTER — Telehealth: Payer: Self-pay | Admitting: Radiation Oncology

## 2020-05-11 ENCOUNTER — Encounter: Payer: Self-pay | Admitting: Radiation Oncology

## 2020-05-11 ENCOUNTER — Ambulatory Visit: Payer: Self-pay | Admitting: General Surgery

## 2020-05-11 ENCOUNTER — Ambulatory Visit
Admission: RE | Admit: 2020-05-11 | Discharge: 2020-05-11 | Disposition: A | Discharge status: Home / Self Care | Payer: PPO | Source: Ambulatory Visit | Attending: Radiation Oncology | Admitting: Radiation Oncology

## 2020-05-11 ENCOUNTER — Other Ambulatory Visit: Payer: Self-pay

## 2020-05-11 DIAGNOSIS — D0511 Intraductal carcinoma in situ of right breast: Secondary | ICD-10-CM

## 2020-05-11 DIAGNOSIS — J3089 Other allergic rhinitis: Secondary | ICD-10-CM | POA: Diagnosis not present

## 2020-05-11 DIAGNOSIS — E785 Hyperlipidemia, unspecified: Secondary | ICD-10-CM | POA: Diagnosis not present

## 2020-05-11 DIAGNOSIS — I10 Essential (primary) hypertension: Secondary | ICD-10-CM | POA: Diagnosis not present

## 2020-05-11 DIAGNOSIS — Z170 Estrogen receptor positive status [ER+]: Secondary | ICD-10-CM | POA: Diagnosis not present

## 2020-05-11 DIAGNOSIS — F411 Generalized anxiety disorder: Secondary | ICD-10-CM | POA: Diagnosis not present

## 2020-05-11 DIAGNOSIS — J4540 Moderate persistent asthma, uncomplicated: Secondary | ICD-10-CM | POA: Diagnosis not present

## 2020-05-11 DIAGNOSIS — J302 Other seasonal allergic rhinitis: Secondary | ICD-10-CM | POA: Diagnosis not present
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
## 2020-05-11 NOTE — Telephone Encounter (Signed)
I spoke with the patient and we will hold off on proceeding with radiation planning tomorrow since she is having re-excision of her margins.

## 2020-05-12 ENCOUNTER — Ambulatory Visit: Payer: PPO | Admitting: Radiation Oncology

## 2020-05-13 ENCOUNTER — Encounter: Payer: Self-pay | Admitting: *Deleted
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
## 2020-05-18 ENCOUNTER — Encounter (HOSPITAL_COMMUNITY): Payer: Self-pay | Admitting: General Surgery

## 2020-05-18 ENCOUNTER — Other Ambulatory Visit (HOSPITAL_COMMUNITY)
Admission: RE | Admit: 2020-05-18 | Discharge: 2020-05-18 | Disposition: A | Discharge status: Home / Self Care | Payer: PPO | Source: Ambulatory Visit | Attending: General Surgery | Admitting: General Surgery

## 2020-05-18 ENCOUNTER — Telehealth: Payer: Self-pay | Admitting: Radiation Oncology

## 2020-05-18 ENCOUNTER — Other Ambulatory Visit: Payer: Self-pay

## 2020-05-18 ENCOUNTER — Inpatient Hospital Stay: Payer: PPO | Attending: Oncology | Admitting: Oncology

## 2020-05-18 VITALS — BP 116/77 | HR 93 | Temp 97.9°F | Resp 18 | Ht 66.0 in | Wt 183.4 lb

## 2020-05-18 DIAGNOSIS — Z01812 Encounter for preprocedural laboratory examination: Secondary | ICD-10-CM | POA: Insufficient documentation

## 2020-05-18 DIAGNOSIS — Z20822 Contact with and (suspected) exposure to covid-19: Secondary | ICD-10-CM | POA: Insufficient documentation

## 2020-05-18 DIAGNOSIS — G4733 Obstructive sleep apnea (adult) (pediatric): Secondary | ICD-10-CM | POA: Diagnosis not present

## 2020-05-18 DIAGNOSIS — I10 Essential (primary) hypertension: Secondary | ICD-10-CM | POA: Diagnosis not present

## 2020-05-18 DIAGNOSIS — D0511 Intraductal carcinoma in situ of right breast: Secondary | ICD-10-CM | POA: Insufficient documentation

## 2020-05-18 NOTE — Telephone Encounter (Signed)
I called the patient to follow up on her surgical dates for re-excising her margins. She's on the OR schedule for 05/20/20. She was in agreement to proceed with simulation and will come in about 4 weeks postoperatively and have simulation on 06/15/20.
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
## 2020-05-18 NOTE — Progress Notes (Signed)
PCP - Dr. Arturo Morton Cardiologist - Dr. Ola Spurr and Dr. [name]   Chest x-ray -  EKG - 04/06/20 Stress Test - 01/24/18 ECHO - Yes, "It's been a while maybe before stress test" Cardiac Cath - no  Sleep Study - At home, No OSA detected  DM - Denies  Aspirin Instructions: Last dose 05/16/20  ERAS Protcol -Yes instructions given  COVID TEST- 05/18/20  Anesthesia review: Yes h/o HTN and SVT  Patient was given pre-op instructions over the phone. The opportunity was given for the patient to ask questions. No further questions asked. Patient verbalized understanding of instructions given.   Nothing to eat after midnight.  May drink clear liquids until 1045am day of surgery  7 days prior to surgery STOP taking any Aspirin (unless otherwise instructed by your surgeon), Aleve, Naproxen, Ibuprofen, Motrin, Advil, Goody's, BC's, all herbal medications, fish oil, and all vitamins.  Medications for day of surgery: diltiazem (CARDIZEM CD) 180 MG 24 hr capsule gabapentin (NEURONTIN) 400 MG capsule pantoprazole (PROTONIX) 40 MG tablet rosuvastatin (CRESTOR) 20 MG tablet  sertraline (ZOLOFT) 25 MG tablet  IF needed: albuterol (PROVENTIL) (2.5 MG/3ML) 0.083% nebulizer solution albuterol (VENTOLIN HFA) 108 (90 Base) MCG/ACT inhaler  diphenhydrAMINE (BENADRYL) 25 mg capsule  fluticasone (FLONASE) 50 MCG/ACT nasal spray HYDROcodone-acetaminophen (NORCO/VICODIN) 5-325 MG tablet Loratadine 10 MG CAPS tiZANidine (ZANAFLEX) 2 MG tablet montelukast (SINGULAIR) 10 MG tablet

## 2020-05-18 NOTE — Anesthesia Preprocedure Evaluation (Addendum)
Anesthesia Evaluation  Patient identified by MRN, date of birth, ID band Patient awake    Reviewed: Allergy & Precautions, NPO status , Patient's Chart, lab work & pertinent test results  Airway Mallampati: III  TM Distance: >3 FB Neck ROM: Full    Dental no notable dental hx.    Pulmonary asthma ,    Pulmonary exam normal breath sounds clear to auscultation       Cardiovascular hypertension, Pt. on medications Normal cardiovascular exam+ dysrhythmias Atrial Fibrillation  Rhythm:Regular Rate:Normal  ECG: NSR, rate 75   Neuro/Psych Anxiety negative neurological ROS     GI/Hepatic Neg liver ROS, GERD  Medicated and Controlled,  Endo/Other  negative endocrine ROS  Renal/GU negative Renal ROS     Musculoskeletal  (+) Arthritis ,   Abdominal   Peds  Hematology HLD   Anesthesia Other Findings RIGHT BREAST DUCTAL CARCINOMA IN SITU  Reproductive/Obstetrics                            Anesthesia Physical Anesthesia Plan  ASA: II  Anesthesia Plan: General   Post-op Pain Management:    Induction: Intravenous  PONV Risk Score and Plan: 3 and Ondansetron, Dexamethasone, Midazolam and Treatment may vary due to age or medical condition  Airway Management Planned: LMA  Additional Equipment:   Intra-op Plan:   Post-operative Plan: Extubation in OR  Informed Consent: I have reviewed the patients History and Physical, chart, labs and discussed the procedure including the risks, benefits and alternatives for the proposed anesthesia with the patient or authorized representative who has indicated his/her understanding and acceptance.     Dental advisory given  Plan Discussed with: CRNA  Anesthesia Plan Comments: (Reviewed PAT note written 05/18/2020 by Myra Gianotti, PA-C. )       Anesthesia Quick Evaluation

## 2020-05-19 ENCOUNTER — Telehealth: Payer: Self-pay | Admitting: Oncology

## 2020-05-19 LAB — SARS CORONAVIRUS 2 (TAT 6-24 HRS): SARS Coronavirus 2: NEGATIVE

## 2020-05-19 NOTE — Telephone Encounter (Signed)
Scheduled appts per 1/18 los. Left voicemail with appt date and time.

## 2020-05-20 ENCOUNTER — Other Ambulatory Visit: Payer: Self-pay

## 2020-05-20 ENCOUNTER — Ambulatory Visit (HOSPITAL_COMMUNITY): Payer: PPO | Admitting: Vascular Surgery

## 2020-05-20 ENCOUNTER — Encounter (HOSPITAL_COMMUNITY): Payer: Self-pay | Admitting: General Surgery

## 2020-05-20 ENCOUNTER — Ambulatory Visit (HOSPITAL_COMMUNITY)
Admission: RE | Admit: 2020-05-20 | Discharge: 2020-05-20 | Disposition: A | Discharge status: Home / Self Care | Payer: PPO | Attending: General Surgery | Admitting: General Surgery

## 2020-05-20 ENCOUNTER — Encounter (HOSPITAL_COMMUNITY)
Admission: RE | Disposition: A | Discharge status: Home / Self Care | Payer: Self-pay | Source: Home / Self Care | Attending: General Surgery

## 2020-05-20 DIAGNOSIS — I10 Essential (primary) hypertension: Secondary | ICD-10-CM | POA: Diagnosis not present

## 2020-05-20 DIAGNOSIS — E785 Hyperlipidemia, unspecified: Secondary | ICD-10-CM | POA: Diagnosis not present

## 2020-05-20 DIAGNOSIS — Z888 Allergy status to other drugs, medicaments and biological substances status: Secondary | ICD-10-CM | POA: Diagnosis not present

## 2020-05-20 DIAGNOSIS — Z7982 Long term (current) use of aspirin: Secondary | ICD-10-CM | POA: Insufficient documentation

## 2020-05-20 DIAGNOSIS — Z800 Family history of malignant neoplasm of digestive organs: Secondary | ICD-10-CM | POA: Insufficient documentation

## 2020-05-20 DIAGNOSIS — J45901 Unspecified asthma with (acute) exacerbation: Secondary | ICD-10-CM | POA: Diagnosis not present

## 2020-05-20 DIAGNOSIS — Z8042 Family history of malignant neoplasm of prostate: Secondary | ICD-10-CM | POA: Insufficient documentation

## 2020-05-20 DIAGNOSIS — Z7951 Long term (current) use of inhaled steroids: Secondary | ICD-10-CM | POA: Insufficient documentation

## 2020-05-20 DIAGNOSIS — Z806 Family history of leukemia: Secondary | ICD-10-CM | POA: Diagnosis not present

## 2020-05-20 DIAGNOSIS — Z803 Family history of malignant neoplasm of breast: Secondary | ICD-10-CM | POA: Insufficient documentation

## 2020-05-20 DIAGNOSIS — Z809 Family history of malignant neoplasm, unspecified: Secondary | ICD-10-CM | POA: Diagnosis not present

## 2020-05-20 DIAGNOSIS — Z8049 Family history of malignant neoplasm of other genital organs: Secondary | ICD-10-CM | POA: Insufficient documentation

## 2020-05-20 DIAGNOSIS — Z79899 Other long term (current) drug therapy: Secondary | ICD-10-CM | POA: Diagnosis not present

## 2020-05-20 DIAGNOSIS — D0511 Intraductal carcinoma in situ of right breast: Secondary | ICD-10-CM | POA: Insufficient documentation

## 2020-05-20 DIAGNOSIS — Z7952 Long term (current) use of systemic steroids: Secondary | ICD-10-CM | POA: Insufficient documentation

## 2020-05-20 DIAGNOSIS — Z170 Estrogen receptor positive status [ER+]: Secondary | ICD-10-CM | POA: Diagnosis not present

## 2020-05-20 HISTORY — PX: RE-EXCISION OF BREAST LUMPECTOMY: SHX6048

## 2020-05-20 SURGERY — EXCISION, LESION, BREAST
Anesthesia: General | Site: Breast | Laterality: Right

## 2020-05-20 MED ORDER — FENTANYL CITRATE (PF) 250 MCG/5ML IJ SOLN
INTRAMUSCULAR | Status: AC
  Filled 2020-05-20: qty 5

## 2020-05-20 MED ORDER — ONDANSETRON HCL 4 MG/2ML IJ SOLN: 4.0000 mg | Freq: Once | INTRAMUSCULAR | Status: DC | PRN

## 2020-05-20 MED ORDER — PROPOFOL 10 MG/ML IV BOLUS
INTRAVENOUS | Status: DC | PRN
  Administered 2020-05-20: 200 mg via INTRAVENOUS

## 2020-05-20 MED ORDER — BUPIVACAINE HCL (PF) 0.25 % IJ SOLN
INTRAMUSCULAR | Status: AC
  Filled 2020-05-20: qty 30

## 2020-05-20 MED ORDER — ONDANSETRON HCL 4 MG/2ML IJ SOLN
INTRAMUSCULAR | Status: AC
  Filled 2020-05-20: qty 2

## 2020-05-20 MED ORDER — LIDOCAINE 2% (20 MG/ML) 5 ML SYRINGE
INTRAMUSCULAR | Status: DC | PRN
  Administered 2020-05-20: 60 mg via INTRAVENOUS

## 2020-05-20 MED ORDER — DEXAMETHASONE SODIUM PHOSPHATE 10 MG/ML IJ SOLN
INTRAMUSCULAR | Status: AC
  Filled 2020-05-20: qty 1

## 2020-05-20 MED ORDER — CHLORHEXIDINE GLUCONATE CLOTH 2 % EX PADS: 6.0000 | MEDICATED_PAD | Freq: Once | CUTANEOUS | Status: DC

## 2020-05-20 MED ORDER — CEFAZOLIN SODIUM-DEXTROSE 2-4 GM/100ML-% IV SOLN
2.0000 g | INTRAVENOUS | Status: AC
  Administered 2020-05-20: 2 g via INTRAVENOUS
  Filled 2020-05-20: qty 100

## 2020-05-20 MED ORDER — MIDAZOLAM HCL 5 MG/5ML IJ SOLN
INTRAMUSCULAR | Status: DC | PRN
  Administered 2020-05-20: 2 mg via INTRAVENOUS

## 2020-05-20 MED ORDER — DEXAMETHASONE SODIUM PHOSPHATE 10 MG/ML IJ SOLN
INTRAMUSCULAR | Status: DC | PRN
  Administered 2020-05-20: 10 mg via INTRAVENOUS

## 2020-05-20 MED ORDER — CELECOXIB 200 MG PO CAPS
200.0000 mg | ORAL_CAPSULE | ORAL | Status: AC
  Administered 2020-05-20: 200 mg via ORAL
  Filled 2020-05-20: qty 1

## 2020-05-20 MED ORDER — ACETAMINOPHEN 500 MG PO TABS
1000.0000 mg | ORAL_TABLET | ORAL | Status: AC
  Administered 2020-05-20: 1000 mg via ORAL
  Filled 2020-05-20: qty 2

## 2020-05-20 MED ORDER — BUPIVACAINE HCL (PF) 0.25 % IJ SOLN
INTRAMUSCULAR | Status: DC | PRN
  Administered 2020-05-20: 20 mL

## 2020-05-20 MED ORDER — ROCURONIUM BROMIDE 10 MG/ML (PF) SYRINGE
PREFILLED_SYRINGE | INTRAVENOUS | Status: AC
  Filled 2020-05-20: qty 10

## 2020-05-20 MED ORDER — CHLORHEXIDINE GLUCONATE 0.12 % MT SOLN
15.0000 mL | Freq: Once | OROMUCOSAL | Status: AC
  Administered 2020-05-20: 15 mL via OROMUCOSAL
  Filled 2020-05-20: qty 15

## 2020-05-20 MED ORDER — MIDAZOLAM HCL 2 MG/2ML IJ SOLN
INTRAMUSCULAR | Status: AC
  Filled 2020-05-20: qty 2

## 2020-05-20 MED ORDER — LACTATED RINGERS IV SOLN: INTRAVENOUS | Status: DC

## 2020-05-20 MED ORDER — GABAPENTIN 300 MG PO CAPS
300.0000 mg | ORAL_CAPSULE | ORAL | Status: DC
  Filled 2020-05-20: qty 1

## 2020-05-20 MED ORDER — PROPOFOL 10 MG/ML IV BOLUS
INTRAVENOUS | Status: AC
  Filled 2020-05-20: qty 20

## 2020-05-20 MED ORDER — LIDOCAINE 2% (20 MG/ML) 5 ML SYRINGE
INTRAMUSCULAR | Status: AC
  Filled 2020-05-20: qty 5

## 2020-05-20 MED ORDER — FENTANYL CITRATE (PF) 250 MCG/5ML IJ SOLN
INTRAMUSCULAR | Status: DC | PRN
  Administered 2020-05-20 (×2): 25 ug via INTRAVENOUS

## 2020-05-20 MED ORDER — ORAL CARE MOUTH RINSE: 15.0000 mL | Freq: Once | OROMUCOSAL | Status: AC

## 2020-05-20 MED ORDER — FENTANYL CITRATE (PF) 100 MCG/2ML IJ SOLN
25.0000 ug | INTRAMUSCULAR | Status: DC | PRN
  Administered 2020-05-20 (×2): 50 ug via INTRAVENOUS

## 2020-05-20 MED ORDER — ONDANSETRON HCL 4 MG/2ML IJ SOLN
INTRAMUSCULAR | Status: DC | PRN
  Administered 2020-05-20: 4 mg via INTRAVENOUS

## 2020-05-20 MED ORDER — HYDROCODONE-ACETAMINOPHEN 5-325 MG PO TABS: 1.0000 | ORAL_TABLET | Freq: Four times a day (QID) | ORAL | 0 refills | Status: DC | PRN

## 2020-05-20 MED ORDER — FENTANYL CITRATE (PF) 100 MCG/2ML IJ SOLN
INTRAMUSCULAR | Status: AC
  Filled 2020-05-20: qty 2

## 2020-05-20 SURGICAL SUPPLY — 38 items
ADH SKN CLS APL DERMABOND .7 (GAUZE/BANDAGES/DRESSINGS) ×1
APL PRP STRL LF DISP 70% ISPRP (MISCELLANEOUS) ×1
APPLIER CLIP 9.375 MED OPEN (MISCELLANEOUS) ×2
APR CLP MED 9.3 20 MLT OPN (MISCELLANEOUS) ×1
BINDER BREAST LRG (GAUZE/BANDAGES/DRESSINGS) IMPLANT
BINDER BREAST XLRG (GAUZE/BANDAGES/DRESSINGS) IMPLANT
CANISTER SUCT 3000ML PPV (MISCELLANEOUS) ×1 IMPLANT
CHLORAPREP W/TINT 26 (MISCELLANEOUS) ×2 IMPLANT
CLIP APPLIE 9.375 MED OPEN (MISCELLANEOUS) IMPLANT
CNTNR URN SCR LID CUP LEK RST (MISCELLANEOUS) IMPLANT
CONT SPEC 4OZ STRL OR WHT (MISCELLANEOUS) ×4
COVER SURGICAL LIGHT HANDLE (MISCELLANEOUS) ×2 IMPLANT
COVER WAND RF STERILE (DRAPES) ×1 IMPLANT
DERMABOND ADVANCED (GAUZE/BANDAGES/DRESSINGS) ×1
DERMABOND ADVANCED .7 DNX12 (GAUZE/BANDAGES/DRESSINGS) ×1 IMPLANT
DRAPE CHEST BREAST 15X10 FENES (DRAPES) ×2 IMPLANT
ELECT COATED BLADE 2.86 ST (ELECTRODE) ×2 IMPLANT
ELECT REM PT RETURN 9FT ADLT (ELECTROSURGICAL) ×2
ELECTRODE REM PT RTRN 9FT ADLT (ELECTROSURGICAL) ×1 IMPLANT
GAUZE 4X4 16PLY RFD (DISPOSABLE) ×2 IMPLANT
GLOVE BIO SURGEON STRL SZ7.5 (GLOVE) ×2 IMPLANT
GOWN STRL REUS W/ TWL LRG LVL3 (GOWN DISPOSABLE) ×2 IMPLANT
GOWN STRL REUS W/TWL LRG LVL3 (GOWN DISPOSABLE) ×4
KIT BASIN OR (CUSTOM PROCEDURE TRAY) ×2 IMPLANT
KIT TURNOVER KIT B (KITS) ×2 IMPLANT
LIGHT WAVEGUIDE WIDE FLAT (MISCELLANEOUS) IMPLANT
NDL HYPO 25GX1X1/2 BEV (NEEDLE) ×1 IMPLANT
NEEDLE HYPO 25GX1X1/2 BEV (NEEDLE) ×2 IMPLANT
NS IRRIG 1000ML POUR BTL (IV SOLUTION) ×2 IMPLANT
PACK GENERAL/GYN (CUSTOM PROCEDURE TRAY) ×2 IMPLANT
PAD ARMBOARD 7.5X6 YLW CONV (MISCELLANEOUS) ×2 IMPLANT
PENCIL SMOKE EVACUATOR (MISCELLANEOUS) ×2 IMPLANT
SUT MON AB 4-0 PC3 18 (SUTURE) ×2 IMPLANT
SUT SILK 2 0 PERMA HAND 18 BK (SUTURE) IMPLANT
SUT VIC AB 3-0 SH 18 (SUTURE) ×2 IMPLANT
SYR CONTROL 10ML LL (SYRINGE) ×2 IMPLANT
TOWEL GREEN STERILE (TOWEL DISPOSABLE) ×2 IMPLANT
TOWEL GREEN STERILE FF (TOWEL DISPOSABLE) ×1 IMPLANT

## 2020-05-20 NOTE — Op Note (Signed)
05/20/2020  2:32 PM  PATIENT:  Rachael Osborn  65 y.o. female  PRE-OPERATIVE DIAGNOSIS:  RIGHT BREAST DUCTAL CARCINOMA IN SITU  POST-OPERATIVE DIAGNOSIS:  RIGHT BREAST DUCTAL CARCINOMA IN SITU  PROCEDURE:  Procedure(s): RE-EXCISION OF RIGHT BREAST INFERIOR MARGIN AND ADDITIONAL MEDIAL MARGIN (Right)  SURGEON:  Surgeon(s) and Role:    * Jovita Kussmaul, MD - Primary  PHYSICIAN ASSISTANT:   ASSISTANTS: none   ANESTHESIA:   local and general  EBL:  10 mL   BLOOD ADMINISTERED:none  DRAINS: none   LOCAL MEDICATIONS USED:  MARCAINE     SPECIMEN:  Source of Specimen:  right breast inferior and medial margin  DISPOSITION OF SPECIMEN:  PATHOLOGY  COUNTS:  YES  TOURNIQUET:  * No tourniquets in log *  DICTATION: .Dragon Dictation   After informed consent was obtained the patient was brought to the operating room and placed in the supine position on the operating table.  After adequate induction of general anesthesia the patient's right breast was prepped with ChloraPrep, allowed to dry, and draped in usual sterile manner.  An appropriate timeout was performed.  The area around the previous incision was infiltrated with quarter percent Marcaine.  The upper periareolar incision was then reopened sharply with a 15 blade knife.  The incision was carried through the skin and subcutaneous tissue sharply with the electrocautery until the previous lumpectomy cavity was identified and opened.  The inferior margin was then reexcised sharply with the electrocautery and marked with a stitch on the new true surgical margin.  I also removed an additional medial margin sharply with the electrocautery and marked this also with a stitch on the new true surgical margin.  Hemostasis was achieved using the Bovie electrocautery.  The wound was irrigated with saline and marked again with clips.  The wound was infiltrated with more quarter percent Marcaine.  Hemostasis was achieved using the Bovie  electrocautery.  The deep layer of the wound was then closed with layers of interrupted 3-0 Vicryl stitches.  The skin was then closed with interrupted 4-0 Monocryl subcuticular stitches.  Dermabond dressings were applied.  The patient tolerated the procedure well.  At the end of the case all needle sponge and instrument counts were correct.  The patient was then awakened and taken to recovery in stable condition.  PLAN OF CARE: Discharge to home after PACU  PATIENT DISPOSITION:  PACU - hemodynamically stable.   Delay start of Pharmacological VTE agent (>24hrs) due to surgical blood loss or risk of bleeding: not applicable

## 2020-05-20 NOTE — Transfer of Care (Signed)
Immediate Anesthesia Transfer of Care Note  Patient: Rachael Osborn  Procedure(s) Performed: RE-EXCISION OF RIGHT BREAST INFERIOR MARGIN AND ADDITIONAL MEDIAL MARGIN (Right Breast)  Patient Location: PACU  Anesthesia Type:General  Level of Consciousness: awake, alert , oriented and patient cooperative  Airway & Oxygen Therapy: Patient Spontanous Breathing and Patient connected to face mask oxygen  Post-op Assessment: Report given to RN, Post -op Vital signs reviewed and stable and Patient moving all extremities  Post vital signs: Reviewed and stable  Last Vitals:  Vitals Value Taken Time  BP    Temp    Pulse    Resp    SpO2      Last Pain:  Vitals:   05/20/20 1200  TempSrc:   PainSc: 0-No pain      Patients Stated Pain Goal: 5 (38/45/36 [id number])  Complications: No complications documented.

## 2020-05-20 NOTE — Anesthesia Postprocedure Evaluation (Signed)
Anesthesia Post Note  Patient: Rachael Osborn  Procedure(s) Performed: RE-EXCISION OF RIGHT BREAST INFERIOR MARGIN AND ADDITIONAL MEDIAL MARGIN (Right Breast)     Patient location during evaluation: PACU Anesthesia Type: General Level of consciousness: awake Pain management: pain level controlled Vital Signs Assessment: post-procedure vital signs reviewed and stable Respiratory status: spontaneous breathing, nonlabored ventilation, respiratory function stable and patient connected to nasal cannula oxygen Cardiovascular status: blood pressure returned to baseline and stable Postop Assessment: no apparent nausea or vomiting Anesthetic complications: no   No complications documented.  Last Vitals:  Vitals:   05/20/20 1454 05/20/20 1509  BP: 120/86 130/87  Pulse: 65 67  Resp: 20 13  Temp:  36.7 C  SpO2: 100% 100%    Last Pain:  Vitals:   05/20/20 1509  TempSrc:   PainSc: 0-No pain                 Ryan P Ellender

## 2020-05-20 NOTE — Anesthesia Procedure Notes (Signed)
Procedure Name: LMA Insertion Date/Time: 05/20/2020 1:30 PM Performed by: Myna Bright, CRNA Pre-anesthesia Checklist: Patient identified, Emergency Drugs available, Suction available and Patient being monitored Patient Re-evaluated:Patient Re-evaluated prior to induction Oxygen Delivery Method: Circle system utilized Preoxygenation: Pre-oxygenation with 100% oxygen Induction Type: IV induction Ventilation: Mask ventilation without difficulty LMA: LMA inserted LMA Size: 4.0 Number of attempts: 1 Placement Confirmation: positive ETCO2 and breath sounds checked- equal and bilateral Tube secured with: Tape Dental Injury: Teeth and Oropharynx as per pre-operative assessment

## 2020-05-20 NOTE — H&P (Signed)
Rachael Osborn  Location: [facility] Patient #: [id number] DOB: 04-26-1956 Married / Language: English / Race: Black or African American Female   History of Present Illness The patient is a 65 year old female who presents for a follow-up for Breast cancer. The patient is a 65 year old black female who is about one month status post right breast lumpectomy for ductal carcinoma in situ that was ER/PR positive. Unfortunate she had a focally positive inferior margin that will need to be reexcised. She tolerated the surgery well. She denies any significant breast pain. She is tearful today as she is going through some difficulty at work.   Allergies  Methocarbamol *CHEMICALS*  Rash. ZyrTEC *ANTIHISTAMINES*  Itching. Allergies Reconciled   Medication History  Albuterol Sulfate ((2.5 MG/3ML)0.083% Nebulized Soln, Inhalation) Active. Albuterol (90MCG/ACT Aerosol Soln, Inhalation) Active. Aspirin (81MG  Tablet DR, Oral) Active. Symbicort (160-4.5MCG/ACT Aerosol, Inhalation) Active. Cardizem LA (180MG  Tablet ER 24HR, Oral) Active. dilTIAZem CD (180MG  Capsule ER 24HR, Oral) Active. Flonase Allergy Relief (50MCG/ACT Suspension, Nasal) Active. Flovent HFA (110MCG/ACT Aerosol, Inhalation) Active. Neurontin (400MG  Capsule, Oral) Active. HydroDiuril (25MG  Tablet, Oral) Active. Xopenex HFA (45MCG/ACT Aerosol, Inhalation) Active. Claritin (10MG  Capsule, Oral) Active. Singulair (10MG  Tablet, Oral) Active. Protonix (40MG  Tablet DR, Oral) Active. Crestor (20MG  Tablet, Oral) Active. Zoloft (25MG  Tablet, Oral) Active. Diovan (80MG  Tablet, Oral) Active. Medications Reconciled    Review of Systems  General Not Present- Appetite Loss, Chills, Fatigue, Fever, Night Sweats, Weight Gain and Weight Loss. Skin Present- Rash. Not Present- Change in Wart/Mole, Dryness, Hives, Jaundice, New Lesions, Non-Healing Wounds and Ulcer. HEENT Present- Seasonal Allergies. Not  Present- Earache, Hearing Loss, Hoarseness, Nose Bleed, Oral Ulcers, Ringing in the Ears, Sinus Pain, Sore Throat, Visual Disturbances, Wears glasses/contact lenses and Yellow Eyes. Respiratory Not Present- Bloody sputum, Chronic Cough, Difficulty Breathing, Snoring and Wheezing. Breast Not Present- Breast Mass, Breast Pain, Nipple Discharge and Skin Changes. Cardiovascular Not Present- Chest Pain, Difficulty Breathing Lying Down, Leg Cramps, Palpitations, Rapid Heart Rate, Shortness of Breath and Swelling of Extremities. Gastrointestinal Not Present- Abdominal Pain, Bloating, Bloody Stool, Change in Bowel Habits, Chronic diarrhea, Constipation, Difficulty Swallowing, Excessive gas, Gets full quickly at meals, Hemorrhoids, Indigestion, Nausea, Rectal Pain and Vomiting. Female Genitourinary Not Present- Frequency, Nocturia, Painful Urination, Pelvic Pain and Urgency. Musculoskeletal Present- Back Pain. Not Present- Joint Pain, Joint Stiffness, Muscle Pain, Muscle Weakness and Swelling of Extremities. Neurological Not Present- Decreased Memory, Fainting, Headaches, Numbness, Seizures, Tingling, Tremor, Trouble walking and Weakness. Psychiatric Not Present- Anxiety, Bipolar, Change in Sleep Pattern, Depression, Fearful and Frequent crying. Endocrine Not Present- Cold Intolerance, Excessive Hunger, Hair Changes, Heat Intolerance, Hot flashes and New Diabetes. Hematology Not Present- Blood Thinners, Easy Bruising, Excessive bleeding, Gland problems, HIV and Persistent Infections.   Physical Exam General Mental Status-Alert. General Appearance-Consistent with stated age. Hydration-Well hydrated. Voice-Normal.  Head and Neck Head-normocephalic, atraumatic with no lesions or palpable masses. Trachea-midline. Thyroid Gland Characteristics - normal size and consistency.  Eye Eyeball - Bilateral-Extraocular movements intact. Sclera/Conjunctiva - Bilateral-No scleral icterus.  Chest  and Lung Exam Chest and lung exam reveals -quiet, even and easy respiratory effort with no use of accessory muscles and on auscultation, normal breath sounds, no adventitious sounds and normal vocal resonance. Inspection Chest Wall - Normal. Back - normal.  Breast Note: The upper right breast periareolar incision is healing nicely with no sign of infection or seroma.   Cardiovascular Cardiovascular examination reveals -normal heart sounds, regular rate and rhythm with no murmurs and normal pedal  pulses bilaterally.  Abdomen Inspection Inspection of the abdomen reveals - No Hernias. Skin - Scar - no surgical scars. Palpation/Percussion Palpation and Percussion of the abdomen reveal - Soft, Non Tender, No Rebound tenderness, No Rigidity (guarding) and No hepatosplenomegaly. Auscultation Auscultation of the abdomen reveals - Bowel sounds normal.  Neurologic Neurologic evaluation reveals -alert and oriented x 3 with no impairment of recent or remote memory. Mental Status-Normal.  Musculoskeletal Normal Exam - Left-Upper Extremity Strength Normal and Lower Extremity Strength Normal. Normal Exam - Right-Upper Extremity Strength Normal and Lower Extremity Strength Normal.  Lymphatic Head & Neck  General Head & Neck Lymphatics: Bilateral - Description - Normal. Axillary  General Axillary Region: Bilateral - Description - Normal. Tenderness - Non Tender. Femoral & Inguinal  Generalized Femoral & Inguinal Lymphatics: Bilateral - Description - Normal. Tenderness - Non Tender.    Assessment & Plan  DUCTAL CARCINOMA IN SITU (DCIS) OF RIGHT BREAST (D05.11) Impression: The patient is about one month status post right breast lumpectomy for ductal carcinoma in situ. Unfortunately she had a focally positive inferior margin that will need to be reexcised. I have discussed with her in detail the risks and benefits of the operation as well as some of the technical aspects and she  understands and wishes to proceed.

## 2020-05-20 NOTE — Interval H&P Note (Signed)
History and Physical Interval Note:  05/20/2020 12:38 PM  Rachael Osborn  has presented today for surgery, with the diagnosis of RIGHT BREAST DUCTAL CARCINOMA IN SITU.  The various methods of treatment have been discussed with the patient and family. After consideration of risks, benefits and other options for treatment, the patient has consented to  Procedure(s): RE-EXCISION OF RIGHT BREAST INFERIOR MARGIN (Right) as a surgical intervention.  The patient's history has been reviewed, patient examined, no change in status, stable for surgery.  I have reviewed the patient's chart and labs.  Questions were answered to the patient's satisfaction.     [name]

## 2020-05-21 ENCOUNTER — Encounter (HOSPITAL_COMMUNITY): Payer: Self-pay | Admitting: General Surgery

## 2020-05-24 LAB — SURGICAL PATHOLOGY

## 2020-05-27 ENCOUNTER — Encounter: Payer: Self-pay | Admitting: *Deleted

## 2020-06-15 ENCOUNTER — Ambulatory Visit
Admission: RE | Admit: 2020-06-15 | Discharge: 2020-06-15 | Disposition: A | Discharge status: Home / Self Care | Payer: PPO | Source: Ambulatory Visit | Attending: Radiation Oncology | Admitting: Radiation Oncology

## 2020-06-15 ENCOUNTER — Other Ambulatory Visit: Payer: Self-pay

## 2020-06-15 DIAGNOSIS — D0511 Intraductal carcinoma in situ of right breast: Secondary | ICD-10-CM | POA: Diagnosis not present

## 2020-06-15 DIAGNOSIS — Z510 Encounter for antineoplastic radiation therapy: Secondary | ICD-10-CM | POA: Diagnosis not present

## 2020-06-16 ENCOUNTER — Other Ambulatory Visit: Payer: Self-pay | Admitting: Family Medicine

## 2020-06-16 MED ORDER — LORATADINE 10 MG PO TABS: 10.0000 mg | ORAL_TABLET | Freq: Every day | ORAL | 0 refills | Status: DC

## 2020-06-17 ENCOUNTER — Telehealth: Payer: Self-pay | Admitting: Allergy & Immunology

## 2020-06-17 NOTE — Telephone Encounter (Signed)
Patient stating has an RX for Loratadine 10 MG CAPS this is a quality of 90 patient asking for a 180 quality instead please advise

## 2020-06-18 ENCOUNTER — Other Ambulatory Visit: Payer: Self-pay

## 2020-06-18 ENCOUNTER — Encounter: Payer: Self-pay | Admitting: Family Medicine

## 2020-06-18 ENCOUNTER — Ambulatory Visit (INDEPENDENT_AMBULATORY_CARE_PROVIDER_SITE_OTHER): Payer: PPO | Admitting: Family Medicine

## 2020-06-18 VITALS — BP 108/72 | HR 72 | Resp 16

## 2020-06-18 DIAGNOSIS — K219 Gastro-esophageal reflux disease without esophagitis: Secondary | ICD-10-CM

## 2020-06-18 DIAGNOSIS — J4530 Mild persistent asthma, uncomplicated: Secondary | ICD-10-CM | POA: Diagnosis not present

## 2020-06-18 DIAGNOSIS — J3089 Other allergic rhinitis: Secondary | ICD-10-CM

## 2020-06-18 DIAGNOSIS — J302 Other seasonal allergic rhinitis: Secondary | ICD-10-CM | POA: Diagnosis not present

## 2020-06-18 MED ORDER — LORATADINE 10 MG PO TABS: ORAL_TABLET | ORAL | 1 refills | Status: DC

## 2020-06-18 NOTE — Patient Instructions (Addendum)
Asthma Continue montelukast 10 mg once a day to prevent cough or wheeze  Continue albuterol 2 puffs every 4 hours as needed for cough or wheeze OR Instead use albuterol 0.083% solution via nebulizer one unit vial every 4 hours as needed for cough or wheeze For asthma flare, begin Alvesco 80-2 puffs twice a day for 2 weeks or until cough and wheeze free.  Allergic rhinitis Continue allergen avoidance measures directed toward grass pollen, weed pollen, tree pollen, and dust mite as listed below Continue loratadine 10 mg once a day as needed for runny nose or itch.  You may take an additional 10 mg tablet for breakthrough symptoms Continue Flonase nasal spray 2 sprays in each nostril once a day as needed for stuffy nose.  In the right nostril, point the applicator out toward the right ear. In the left nostril, point the applicator out toward the left ear Continue azelastine 1 to 2 sprays in each nostril twice a day as needed for runny nose  Consider saline nasal rinses or saline mist as needed for nasal symptoms. Use this before any medicated nasal sprays for best result  Reflux Continue dietary and lifestyle modifications as listed below Continue pantoprazole once a day as previously prescribed  Call the clinic if this treatment plan is not working well for you  Follow up in 6 months or sooner if needed.  Reducing Pollen Exposure The American Academy of All[employer], Ast[employer] and Immun[employer] suggests the following steps to reduce your exposure to pollen during allergy seasons. 1. Do not hang sheets or clothing out to dry; pollen may collect on these items. 2. Do not mow lawns or spend time around freshly cut grass; mowing stirs up pollen. 3. Keep windows closed at night.  Keep car windows closed while driving. 4. Minimize morning activities outdoors, a time when pollen counts are usually at their highest. 5. Stay indoors as much as possible when pollen counts or humidity is high and on windy days  when pollen tends to remain in the air longer. 6. Use air conditioning when possible.  Many air conditioners have filters that trap the pollen spores. 7. Use a HEPA room air filter to remove pollen form the indoor air you breathe.   Control of Dust Mite Allergen Dust mites play a major role in allergic asthma and rhinitis. They occur in environments with high humidity wherever human skin is found. Dust mites absorb humidity from the atmosphere (ie, they do not drink) and feed on organic matter (including shed human and animal skin). Dust mites are a microscopic type of insect that you cannot see with the naked eye. High levels of dust mites have been detected from mattresses, pillows, carpets, upholstered furniture, bed covers, clothes, soft toys and any woven material. The principal allergen of the dust mite is found in its feces. A gram of dust may contain 1,000 mites and 250,000 fecal particles. Mite antigen is easily measured in the air during house cleaning activities. Dust mites do not bite and do not cause harm to humans, other than by triggering allergies/asthma.  Ways to decrease your exposure to dust mites in your home:  1. Encase mattresses, box springs and pillows with a mite-impermeable barrier or cover  2. Wash sheets, blankets and drapes weekly in hot water (130 F) with detergent and dry them in a dryer on the hot setting.  3. Have the room cleaned frequently with a vacuum cleaner and a damp dust-mop. For carpeting or rugs, vacuuming with  a vacuum cleaner equipped with a high-efficiency particulate air (HEPA) filter. The dust mite allergic individual should not be in a room which is being cleaned and should wait 1 hour after cleaning before going into the room.  4. Do not sleep on upholstered furniture (eg, couches).  5. If possible removing carpeting, upholstered furniture and drapery from the home is ideal. Horizontal blinds should be eliminated in the rooms where the person  spends the most time (bedroom, study, television room). Washable vinyl, roller-type shades are optimal.  6. Remove all non-washable stuffed toys from the bedroom. Wash stuffed toys weekly like sheets and blankets above.  7. Reduce indoor humidity to less than 50%. Inexpensive humidity monitors can be purchased at most hardware stores. Do not use a humidifier as can make the problem worse and are not recommended.  Lifestyle Changes for Controlling GERD When you have GERD, stomach acid feels as if it's backing up toward your mouth. Whether or not you take medication to control your GERD, your symptoms can often be improved with lifestyle changes.   Raise Your Head  Reflux is more likely to strike when you're lying down flat, because stomach fluid can  flow backward more easily. Raising the head of your bed 4-6 inches can help. To do this:  Slide blocks or books under the legs at the head of your bed. Or, place a wedge under  the mattress. Many foam stores can make a suitable wedge for you. The wedge  should run from your waist to the top of your head.  Don't just prop your head on several pillows. This increases pressure on your  stomach. It can make GERD worse.  Watch Your Eating Habits Certain foods may increase the acid in your stomach or relax the lower esophageal sphincter, making GERD more likely. It's best to avoid the following:  Coffee, tea, and carbonated drinks (with and without caffeine)  Fatty, fried, or spicy food  Mint, chocolate, onions, and tomatoes  Any other foods that seem to irritate your stomach or cause you pain  Relieve the Pressure  Eat smaller meals, even if you have to eat more often.  Don't lie down right after you eat. Wait a few hours for your stomach to empty.  Avoid tight belts and tight-fitting clothes.  Lose excess weight.  Tobacco and Alcohol  Avoid smoking tobacco and drinking alcohol. They can make GERD symptoms worse.

## 2020-06-18 NOTE — Progress Notes (Signed)
[address] [place] [address] Dept: [phone]  FOLLOW UP NOTE  Patient ID: Rachael Osborn, female    DOB: [birth year]  Age: 65 y.o. MRN: [id number] Date of Office Visit: 06/18/2020  Assessment  Chief Complaint: Allergic Rhinitis   HPI Rachael Osborn is a 65 year old female who presents to the clinic for acute evaluation of nasal issues.  She was last seen in this clinic on 02/16/2020 by Dr. Verlin Fester for evaluation of asthma and allergic rhinitis.  In the interim, she has recently been diagnosed with ductal carcinoma in situ of the right breast and has undergone 2 lumpectomy surgeries.  She will move forward with radiation in the coming weeks.  At today's visit, she reports her asthma has been well controlled with no shortness of breath, cough, or wheeze.  She continues montelukast 10 mg once a day and uses albuterol infrequently.  She is not currently using a daily inhaler as she reports Symbicort was too expensive and Flovent 110 increased her blood pressure.  She is currently not using a daily inhaler nor does she have an inhaler for asthma rescue.  Allergic rhinitis is reported as moderately well controlled with occasional nasal congestion occurring mostly at night for which she continues loratadine 10 mg once or twice a day and Flonase as needed.  She does report the Flonase dries her out if she uses it every day.  She has recently started using azelastine which is providing moderate relief of symptoms.  She has used nasal saline rinse a few times, however, this did not provide adequate relief.  Reflux is reported as well controlled with pantoprazole 40 mg once a day in addition to recent dietary changes.  Her current medications are listed in the chart.   Drug Allergies:  Allergies  Allergen Reactions  . Robaxin [Methocarbamol] Rash  . Zyrtec [Cetirizine] Itching    Physical Exam: BP 108/72   Pulse 72   Resp 16   SpO2 96%    Physical Exam Vitals reviewed.   Constitutional:      Appearance: Normal appearance.  HENT:     Head: Normocephalic and atraumatic.     Right Ear: Tympanic membrane normal.     Left Ear: Tympanic membrane normal.     Nose:     Comments: Bilateral nares edematous and pale with clear nasal drainage noted.    Mouth/Throat:     Pharynx: Oropharynx is clear.  Eyes:     Conjunctiva/sclera: Conjunctivae normal.  Cardiovascular:     Rate and Rhythm: Normal rate and regular rhythm.     Heart sounds: Normal heart sounds. No murmur heard.   Pulmonary:     Effort: Pulmonary effort is normal.     Breath sounds: Normal breath sounds.     Comments: Lungs clear to auscultation Musculoskeletal:        General: Normal range of motion.     Cervical back: Normal range of motion and neck supple.  Skin:    General: Skin is warm and dry.  Neurological:     Mental Status: She is alert and oriented to person, place, and time.  Psychiatric:        Mood and Affect: Mood normal.        Behavior: Behavior normal.        Thought Content: Thought content normal.        Judgment: Judgment normal.     Diagnostics: FVC 2.65, FEV1 2.23.  Predicted FVC 2.63, predicted FEV1 2.05.  Spirometry indicates normal ventilatory function.  Assessment and Plan: 1. Mild persistent asthma without complication   2. Seasonal and perennial allergic rhinitis   3. Gastroesophageal reflux disease, unspecified whether esophagitis present     Meds ordered this encounter  Medications  . loratadine (CLARITIN) 10 MG tablet    Sig: Can take one tablet one to two times daily if needed for runny nose and itching.    Dispense:  180 tablet    Refill:  1    Patient Instructions  Asthma Continue montelukast 10 mg once a day to prevent cough or wheeze  Continue albuterol 2 puffs every 4 hours as needed for cough or wheeze OR Instead use albuterol 0.083% solution via nebulizer one unit vial every 4 hours as needed for cough or wheeze For asthma flare, begin  Alvesco 80-2 puffs twice a day for 2 weeks or until cough and wheeze free.  Allergic rhinitis Continue allergen avoidance measures directed toward grass pollen, weed pollen, tree pollen, and dust mite as listed below Continue loratadine 10 mg once a day as needed for runny nose or itch.  You may take an additional 10 mg tablet for breakthrough symptoms Continue Flonase nasal spray 2 sprays in each nostril once a day as needed for stuffy nose.  In the right nostril, point the applicator out toward the right ear. In the left nostril, point the applicator out toward the left ear Continue azelastine 1 to 2 sprays in each nostril twice a day as needed for runny nose  Consider saline nasal rinses or saline mist as needed for nasal symptoms. Use this before any medicated nasal sprays for best result  Reflux Continue dietary and lifestyle modifications as listed below Continue pantoprazole once a day as previously prescribed  Call the clinic if this treatment plan is not working well for you  Follow up in 6 months or sooner if needed.   Return in about 6 months (around 12/16/2020), or if symptoms worsen or fail to improve.    Thank you for the opportunity to care for this patient.  Please do not hesitate to contact me with questions.  Gareth Morgan, FNP [facility] [place] [facility] [place]

## 2020-06-21 ENCOUNTER — Encounter: Payer: Self-pay | Admitting: *Deleted

## 2020-06-22 DIAGNOSIS — Z510 Encounter for antineoplastic radiation therapy: Secondary | ICD-10-CM | POA: Diagnosis not present

## 2020-06-22 DIAGNOSIS — D0511 Intraductal carcinoma in situ of right breast: Secondary | ICD-10-CM | POA: Diagnosis not present

## 2020-06-23 ENCOUNTER — Ambulatory Visit
Admission: RE | Admit: 2020-06-23 | Discharge: 2020-06-23 | Disposition: A | Discharge status: Home / Self Care | Payer: PPO | Source: Ambulatory Visit | Attending: Radiation Oncology | Admitting: Radiation Oncology

## 2020-06-23 ENCOUNTER — Other Ambulatory Visit: Payer: Self-pay

## 2020-06-23 DIAGNOSIS — D0511 Intraductal carcinoma in situ of right breast: Secondary | ICD-10-CM | POA: Diagnosis not present

## 2020-06-23 DIAGNOSIS — Z510 Encounter for antineoplastic radiation therapy: Secondary | ICD-10-CM | POA: Diagnosis not present

## 2020-06-24 ENCOUNTER — Other Ambulatory Visit: Payer: Self-pay

## 2020-06-24 ENCOUNTER — Ambulatory Visit
Admission: RE | Admit: 2020-06-24 | Discharge: 2020-06-24 | Disposition: A | Discharge status: Home / Self Care | Payer: PPO | Source: Ambulatory Visit | Attending: Radiation Oncology | Admitting: Radiation Oncology

## 2020-06-24 DIAGNOSIS — Z510 Encounter for antineoplastic radiation therapy: Secondary | ICD-10-CM | POA: Diagnosis not present

## 2020-06-24 DIAGNOSIS — D0511 Intraductal carcinoma in situ of right breast: Secondary | ICD-10-CM | POA: Diagnosis not present

## 2020-06-24 NOTE — Progress Notes (Signed)
Pt here for patient teaching.  Pt given Radiation and You booklet, skin care instructions, Alra deodorant and Radiaplex gel.  Reviewed areas of pertinence such as fatigue, hair loss, skin changes, breast tenderness and breast swelling . Pt able to give teach back of to pat skin and use unscented/gentle soap,apply Radiaplex bid, avoid applying anything to skin within 4 hours of treatment, avoid wearing an under wire bra and to use an electric razor if they must shave. Pt verbalizes understanding of information given and will contact nursing with any questions or concerns.     Rachael Janish M. Nashali Ditmer RN, BSN      

## 2020-06-25 ENCOUNTER — Ambulatory Visit
Admission: RE | Admit: 2020-06-25 | Discharge: 2020-06-25 | Disposition: A | Discharge status: Home / Self Care | Payer: PPO | Source: Ambulatory Visit | Attending: Radiation Oncology | Admitting: Radiation Oncology

## 2020-06-25 ENCOUNTER — Other Ambulatory Visit: Payer: Self-pay

## 2020-06-25 DIAGNOSIS — D0511 Intraductal carcinoma in situ of right breast: Secondary | ICD-10-CM | POA: Diagnosis not present

## 2020-06-25 DIAGNOSIS — Z510 Encounter for antineoplastic radiation therapy: Secondary | ICD-10-CM | POA: Diagnosis not present

## 2020-06-25 MED ORDER — ALRA NON-METALLIC DEODORANT (RAD-ONC)
1.0000 "application " | Freq: Once | TOPICAL | Status: AC
  Administered 2020-06-25: 1 via TOPICAL

## 2020-06-25 MED ORDER — RADIAPLEXRX EX GEL: Freq: Once | CUTANEOUS | Status: AC

## 2020-06-28 ENCOUNTER — Other Ambulatory Visit: Payer: Self-pay

## 2020-06-28 ENCOUNTER — Ambulatory Visit
Admission: RE | Admit: 2020-06-28 | Discharge: 2020-06-28 | Disposition: A | Discharge status: Home / Self Care | Payer: PPO | Source: Ambulatory Visit | Attending: Radiation Oncology | Admitting: Radiation Oncology

## 2020-06-28 DIAGNOSIS — D0511 Intraductal carcinoma in situ of right breast: Secondary | ICD-10-CM | POA: Diagnosis not present

## 2020-06-28 DIAGNOSIS — Z510 Encounter for antineoplastic radiation therapy: Secondary | ICD-10-CM | POA: Diagnosis not present

## 2020-06-29 ENCOUNTER — Ambulatory Visit
Admission: RE | Admit: 2020-06-29 | Discharge: 2020-06-29 | Disposition: A | Discharge status: Home / Self Care | Payer: PPO | Source: Ambulatory Visit | Attending: Radiation Oncology | Admitting: Radiation Oncology

## 2020-06-29 ENCOUNTER — Encounter: Payer: Self-pay | Admitting: Radiation Oncology

## 2020-06-29 DIAGNOSIS — D0511 Intraductal carcinoma in situ of right breast: Secondary | ICD-10-CM | POA: Insufficient documentation

## 2020-06-30 ENCOUNTER — Ambulatory Visit
Admission: RE | Admit: 2020-06-30 | Discharge: 2020-06-30 | Disposition: A | Discharge status: Home / Self Care | Payer: PPO | Source: Ambulatory Visit | Attending: Radiation Oncology | Admitting: Radiation Oncology

## 2020-06-30 DIAGNOSIS — D0511 Intraductal carcinoma in situ of right breast: Secondary | ICD-10-CM | POA: Diagnosis not present

## 2020-06-30 DIAGNOSIS — I471 Supraventricular tachycardia: Secondary | ICD-10-CM | POA: Diagnosis not present

## 2020-06-30 DIAGNOSIS — Z9889 Other specified postprocedural states: Secondary | ICD-10-CM | POA: Diagnosis not present

## 2020-06-30 DIAGNOSIS — I10 Essential (primary) hypertension: Secondary | ICD-10-CM | POA: Diagnosis not present

## 2020-06-30 DIAGNOSIS — J4540 Moderate persistent asthma, uncomplicated: Secondary | ICD-10-CM | POA: Diagnosis not present

## 2020-07-01 ENCOUNTER — Ambulatory Visit
Admission: RE | Admit: 2020-07-01 | Discharge: 2020-07-01 | Disposition: A | Discharge status: Home / Self Care | Payer: PPO | Source: Ambulatory Visit | Attending: Radiation Oncology | Admitting: Radiation Oncology

## 2020-07-01 ENCOUNTER — Other Ambulatory Visit: Payer: Self-pay

## 2020-07-01 ENCOUNTER — Encounter: Payer: Self-pay | Admitting: Radiation Oncology

## 2020-07-01 DIAGNOSIS — D0511 Intraductal carcinoma in situ of right breast: Secondary | ICD-10-CM | POA: Diagnosis not present

## 2020-07-01 NOTE — Progress Notes (Signed)
Received patient in the clinic following her radiation therapy today. Patient requesting to speak with a nurse reference events that occurred last night. Patient explains that she began feeling nauseated yesterday evening and later vomited a large amount followed by diarrhea. She goes onto explain that immediately following this episode she had thick sputum build up in her throat, began having trouble breathing and had to use her emergency inhaler. Finally, she explains when she laid down for bed her heart rate at rest was 100-105 and she had a cramp in her left calf.   Attempted to explain that the fluid shift related to the diarrhea and vomiting she had most likely is the cause of her elevated heart rate at rest and muscle cramp in her left calf. Patient confirms she feels much better today and was able to consume a fruit cup this morning. Patient confirms she saw her cardiologist yesterday morning at 0915 and received a normal report.   Finally, the patient request assistance getting her  "paperwork filled out sooner than 4 weeks." Patient understands this information will be relayed to her providers and nurse. Patient understands to expect a phone call at [phone] with any further directions.

## 2020-07-02 ENCOUNTER — Telehealth: Payer: Self-pay | Admitting: *Deleted

## 2020-07-02 ENCOUNTER — Ambulatory Visit
Admission: RE | Admit: 2020-07-02 | Discharge: 2020-07-02 | Disposition: A | Discharge status: Home / Self Care | Payer: PPO | Source: Ambulatory Visit | Attending: Radiation Oncology | Admitting: Radiation Oncology

## 2020-07-02 DIAGNOSIS — D0511 Intraductal carcinoma in situ of right breast: Secondary | ICD-10-CM | POA: Diagnosis not present

## 2020-07-02 MED ORDER — RADIAPLEXRX EX GEL: Freq: Once | CUTANEOUS | Status: AC

## 2020-07-02 NOTE — Telephone Encounter (Signed)
Rachael Osborn with registration staff called reporting patient in lobby requesting speak with forms nurse.   06/28/2020 [name] delivered [facility] form reportedly would not complete cover sheet per registration.  Status check calls daily from registration staff.  Staff informed 7 to 10 business days for processing from my receipt.  Today to main entry lobby to discuss as requested.    "Would like [employer] form as soon as possible.  Not sure what employer will do or pay for disability so I really need this.  Took this long for [employer] to straightened things out on their end.  Surgery was December 10th and December 20th.  I also have another form I'll bring next week you can ake your time with.  No further questions or needs."  Informed today to allow [place] 14 calendar days for completion of forms, in chronological order of receipt.  Expect delay as this nurse is currently backlogged three to four weeks.    Obtained signed R.O.I. for pathology and records request noted with [facility] form.

## 2020-07-02 NOTE — Progress Notes (Signed)
  Radiation Oncology         [phone] ________________________________  Name: Rachael Osborn MRN: [id number]  Date: 06/29/2020  DOB: 1955-12-20  SIMULATION NOTE   NARRATIVE:  The patient underwent simulation today for ongoing radiation therapy.  The existing CT study set was employed for the purpose of virtual treatment planning.  The target and avoidance structures were reviewed and modified as necessary.  Treatment planning then occurred.  The radiation boost prescription was entered and confirmed.  A total of 3 complex treatment devices were fabricated in the form of multi-leaf collimators to shape radiation around the targets while maximally excluding nearby normal structures. I have requested : Isodose Plan.    PLAN:  This modified radiation beam arrangement is intended to continue the current radiation dose to an additional 8 Gy in 4 fractions for a total cumulative dose of 50.56 Gy.    ------------------------------------------------  Jodelle Gross, MD, PhD

## 2020-07-05 ENCOUNTER — Ambulatory Visit
Admission: RE | Admit: 2020-07-05 | Discharge: 2020-07-05 | Disposition: A | Discharge status: Home / Self Care | Payer: PPO | Source: Ambulatory Visit | Attending: Radiation Oncology | Admitting: Radiation Oncology

## 2020-07-05 ENCOUNTER — Other Ambulatory Visit: Payer: Self-pay

## 2020-07-05 DIAGNOSIS — D0511 Intraductal carcinoma in situ of right breast: Secondary | ICD-10-CM | POA: Diagnosis not present

## 2020-07-06 ENCOUNTER — Ambulatory Visit
Admission: RE | Admit: 2020-07-06 | Discharge: 2020-07-06 | Disposition: A | Discharge status: Home / Self Care | Payer: PPO | Source: Ambulatory Visit | Attending: Radiation Oncology | Admitting: Radiation Oncology

## 2020-07-06 ENCOUNTER — Other Ambulatory Visit: Payer: Self-pay

## 2020-07-06 DIAGNOSIS — D0511 Intraductal carcinoma in situ of right breast: Secondary | ICD-10-CM | POA: Diagnosis not present

## 2020-07-07 ENCOUNTER — Ambulatory Visit
Admission: RE | Admit: 2020-07-07 | Discharge: 2020-07-07 | Disposition: A | Discharge status: Home / Self Care | Payer: PPO | Source: Ambulatory Visit | Attending: Radiation Oncology | Admitting: Radiation Oncology

## 2020-07-07 DIAGNOSIS — D0511 Intraductal carcinoma in situ of right breast: Secondary | ICD-10-CM | POA: Diagnosis not present

## 2020-07-08 ENCOUNTER — Ambulatory Visit
Admission: RE | Admit: 2020-07-08 | Discharge: 2020-07-08 | Disposition: A | Discharge status: Home / Self Care | Payer: PPO | Source: Ambulatory Visit | Attending: Radiation Oncology | Admitting: Radiation Oncology

## 2020-07-08 DIAGNOSIS — D0511 Intraductal carcinoma in situ of right breast: Secondary | ICD-10-CM | POA: Diagnosis not present

## 2020-07-09 ENCOUNTER — Ambulatory Visit
Admission: RE | Admit: 2020-07-09 | Discharge: 2020-07-09 | Disposition: A | Discharge status: Home / Self Care | Payer: PPO | Source: Ambulatory Visit | Attending: Radiation Oncology | Admitting: Radiation Oncology

## 2020-07-09 ENCOUNTER — Ambulatory Visit: Payer: PPO | Admitting: Radiation Oncology

## 2020-07-09 DIAGNOSIS — D0511 Intraductal carcinoma in situ of right breast: Secondary | ICD-10-CM | POA: Diagnosis not present

## 2020-07-12 ENCOUNTER — Ambulatory Visit
Admission: RE | Admit: 2020-07-12 | Discharge: 2020-07-12 | Disposition: A | Discharge status: Home / Self Care | Payer: PPO | Source: Ambulatory Visit | Attending: Radiation Oncology | Admitting: Radiation Oncology

## 2020-07-12 ENCOUNTER — Other Ambulatory Visit: Payer: Self-pay

## 2020-07-12 DIAGNOSIS — D0511 Intraductal carcinoma in situ of right breast: Secondary | ICD-10-CM | POA: Diagnosis not present

## 2020-07-13 ENCOUNTER — Ambulatory Visit
Admission: RE | Admit: 2020-07-13 | Discharge: 2020-07-13 | Disposition: A | Discharge status: Home / Self Care | Payer: PPO | Source: Ambulatory Visit | Attending: Radiation Oncology | Admitting: Radiation Oncology

## 2020-07-13 DIAGNOSIS — D0511 Intraductal carcinoma in situ of right breast: Secondary | ICD-10-CM | POA: Diagnosis not present

## 2020-07-14 ENCOUNTER — Ambulatory Visit
Admission: RE | Admit: 2020-07-14 | Discharge: 2020-07-14 | Disposition: A | Discharge status: Home / Self Care | Payer: PPO | Source: Ambulatory Visit | Attending: Radiation Oncology | Admitting: Radiation Oncology

## 2020-07-14 ENCOUNTER — Other Ambulatory Visit: Payer: Self-pay

## 2020-07-14 DIAGNOSIS — D0511 Intraductal carcinoma in situ of right breast: Secondary | ICD-10-CM | POA: Diagnosis not present

## 2020-07-15 ENCOUNTER — Ambulatory Visit
Admission: RE | Admit: 2020-07-15 | Discharge: 2020-07-15 | Disposition: A | Discharge status: Home / Self Care | Payer: PPO | Source: Ambulatory Visit | Attending: Radiation Oncology | Admitting: Radiation Oncology

## 2020-07-15 DIAGNOSIS — D0511 Intraductal carcinoma in situ of right breast: Secondary | ICD-10-CM | POA: Diagnosis not present

## 2020-07-15 NOTE — Telephone Encounter (Signed)
Claim forms signed by Dr. Jana Hakim today and returned to [facility].    [name] notified to pick up Critical Illness Claim, [place] forms from nurse in radiation department.

## 2020-07-16 ENCOUNTER — Other Ambulatory Visit: Payer: Self-pay | Admitting: Allergy

## 2020-07-16 ENCOUNTER — Ambulatory Visit
Admission: RE | Admit: 2020-07-16 | Discharge: 2020-07-16 | Disposition: A | Discharge status: Home / Self Care | Payer: PPO | Source: Ambulatory Visit | Attending: Radiation Oncology | Admitting: Radiation Oncology

## 2020-07-16 ENCOUNTER — Other Ambulatory Visit: Payer: Self-pay

## 2020-07-16 DIAGNOSIS — D0511 Intraductal carcinoma in situ of right breast: Secondary | ICD-10-CM | POA: Diagnosis not present

## 2020-07-16 MED ORDER — RADIAPLEXRX EX GEL: Freq: Once | CUTANEOUS | Status: AC

## 2020-07-19 ENCOUNTER — Telehealth: Payer: Self-pay | Admitting: *Deleted

## 2020-07-19 ENCOUNTER — Encounter: Payer: Self-pay | Admitting: *Deleted

## 2020-07-19 ENCOUNTER — Other Ambulatory Visit: Payer: Self-pay

## 2020-07-19 ENCOUNTER — Ambulatory Visit
Admission: RE | Admit: 2020-07-19 | Discharge: 2020-07-19 | Disposition: A | Discharge status: Home / Self Care | Payer: PPO | Source: Ambulatory Visit | Attending: Radiation Oncology | Admitting: Radiation Oncology

## 2020-07-19 DIAGNOSIS — D0511 Intraductal carcinoma in situ of right breast: Secondary | ICD-10-CM | POA: Diagnosis not present

## 2020-07-19 NOTE — Telephone Encounter (Signed)
SEBRENA ENGH brought [place] signed 06/29/2019 to registration today who brought to this nurse for advice. Connected with [name] in [facility] to inquire of needs.   "You needed something on [facility] letterhead so I brought this if anything else is needed."   Advised she send to [facility] and discuss what they need further.   Not needed by [facility].  This nurse obtained [place] to release pathology because [employer] release was not received with forms.  Faxed ROI to [facility], added ROI with information [facility] preparing for EMR scanning.

## 2020-07-20 ENCOUNTER — Encounter: Payer: Self-pay | Admitting: Radiation Oncology

## 2020-07-20 ENCOUNTER — Ambulatory Visit
Admission: RE | Admit: 2020-07-20 | Discharge: 2020-07-20 | Disposition: A | Discharge status: Home / Self Care | Payer: PPO | Source: Ambulatory Visit | Attending: Radiation Oncology | Admitting: Radiation Oncology

## 2020-07-20 DIAGNOSIS — D0511 Intraductal carcinoma in situ of right breast: Secondary | ICD-10-CM | POA: Diagnosis not present

## 2020-07-21 NOTE — Progress Notes (Signed)
  Patient Name: Rachael Osborn MRN: [id number] DOB: [birth year] Referring Physician: Lurline Del (Profile Not Attached) Date of Service: 07/20/2020 [facility] [place], [place]                                                        End Of Treatment Note  Diagnoses: D05.11-Intraductal carcinoma in situ of right breast  Cancer Staging: High Grade ER positive DCIS of the right breast  Intent: Curative  Radiation Treatment Dates: 06/23/2020 through 07/20/2020 Site Technique Total Dose (Gy) Dose per Fx (Gy) Completed Fx Beam Energies  Breast, Right: Breast_Rt 3D 42.56/42.56 2.66 16/16 6X, 10X  Breast, Right: Breast_Rt_Bst 3D 8/8 2 4/4 6X, 10X   Narrative: The patient tolerated radiation therapy relatively well. She did have anticipated skin changes in addition to sensitivity of the skin in the treatment field.  Plan: The patient will receive a call in about one month from the radiation oncology department. She will continue follow up with Dr. Jana Hakim as well.   ________________________________________________    Carola Rhine, [facility]

## 2020-07-28 ENCOUNTER — Ambulatory Visit (INDEPENDENT_AMBULATORY_CARE_PROVIDER_SITE_OTHER): Payer: PPO | Admitting: Allergy

## 2020-07-28 ENCOUNTER — Other Ambulatory Visit: Payer: Self-pay

## 2020-07-28 ENCOUNTER — Encounter: Payer: Self-pay | Admitting: Allergy

## 2020-07-28 VITALS — BP 106/74 | HR 88 | Temp 97.9°F | Resp 12 | Ht 65.0 in | Wt 182.3 lb

## 2020-07-28 DIAGNOSIS — J4530 Mild persistent asthma, uncomplicated: Secondary | ICD-10-CM | POA: Diagnosis not present

## 2020-07-28 DIAGNOSIS — J0110 Acute frontal sinusitis, unspecified: Secondary | ICD-10-CM

## 2020-07-28 DIAGNOSIS — K219 Gastro-esophageal reflux disease without esophagitis: Secondary | ICD-10-CM

## 2020-07-28 DIAGNOSIS — J4540 Moderate persistent asthma, uncomplicated: Secondary | ICD-10-CM | POA: Diagnosis not present

## 2020-07-28 DIAGNOSIS — J3089 Other allergic rhinitis: Secondary | ICD-10-CM | POA: Diagnosis not present

## 2020-07-28 DIAGNOSIS — J302 Other seasonal allergic rhinitis: Secondary | ICD-10-CM

## 2020-07-28 MED ORDER — AZELASTINE HCL 0.1 % NA SOLN: 2.0000 | Freq: Two times a day (BID) | NASAL | 1 refills | Status: DC

## 2020-07-28 MED ORDER — ALBUTEROL SULFATE (2.5 MG/3ML) 0.083% IN NEBU: 2.5000 mg | INHALATION_SOLUTION | Freq: Four times a day (QID) | RESPIRATORY_TRACT | 1 refills | Status: DC | PRN

## 2020-07-28 MED ORDER — AZITHROMYCIN 250 MG PO TABS: ORAL_TABLET | ORAL | 0 refills | Status: DC

## 2020-07-28 MED ORDER — PANTOPRAZOLE SODIUM 40 MG PO TBEC: DELAYED_RELEASE_TABLET | ORAL | 1 refills | Status: DC

## 2020-07-28 MED ORDER — ALBUTEROL SULFATE HFA 108 (90 BASE) MCG/ACT IN AERS: 2.0000 | INHALATION_SPRAY | RESPIRATORY_TRACT | 0 refills | Status: DC | PRN

## 2020-07-28 NOTE — Progress Notes (Signed)
Follow-up Note  RE: Rachael Osborn MRN: [id number] DOB: 12/16/1955 Date of Office Visit: 07/28/2020   History of present illness: Rachael Osborn is a 65 y.o. female presenting today for sick visit.  She has history of allergic rhinitis, asthma and reflux.  She was last seen in the office on 06/18/20 by our nurse practitioner Am[facility].   She states she has been having sinus headaches, pressure behind her eyes.  She also has been having nose bleeds when she blows her nose.  She also states she has needed to use her albuterol inhaler more about daily since Sunday mostly at night chest congestion.   States usually needs to use albuterol about once a month on average.  She was outside Monday and not sure if pollen and weather worsened these symptoms.  Has been using vicks vapor rub and tylenol to help with the headaches.    She takes singulair and claritin daily.   She has astelin that her PCP prescribed and has been using that.  She does report more nasal drainage at this time.  She uses astelin 1 spray each nostril 1-2 times a day but mostly once a day.    She states she tries to stay away from using prednisone as she has had a cardiac issue ("short-circuit") that required ablation.  She feels that previous prednisone use worsen his condition.  She recently completed radiation for ductal carcinoma in situ.  Review of systems: Review of Systems  Constitutional: Negative.   HENT:       See HPI  Eyes: Negative.   Respiratory:       See HPI  Cardiovascular: Negative.   Gastrointestinal: Negative.   Musculoskeletal: Negative.   Skin: Negative.   Neurological:       See HPI    All other systems negative unless noted above in HPI  Past medical/social/surgical/family history have been reviewed and are unchanged unless specifically indicated below.  No changes  Medication List: Current Outpatient Medications  Medication Sig Dispense Refill  . albuterol (PROVENTIL) (2.5 MG/3ML) 0.083%  nebulizer solution Take 3 mLs (2.5 mg total) by nebulization every 4 (four) hours as needed for wheezing or shortness of breath. 75 mL 1  . albuterol (VENTOLIN HFA) 108 (90 Base) MCG/ACT inhaler Inhale 2 puffs into the lungs every 4 (four) hours as needed for wheezing or shortness of breath. 1 each 1  . aspirin EC 81 MG tablet Take 81 mg by mouth daily.    [name] azelastine (ASTELIN) 0.1 % nasal spray Place 1 spray into both nostrils 2 (two) times daily.    . calcium-vitamin D (OSCAL WITH D) 500-200 MG-UNIT TABS tablet Take 1 tablet by mouth daily.    Stasia Cavalier (EUCRISA) 2 % OINT Apply 1 application topically 2 (two) times daily. (Patient taking differently: Apply 1 application topically daily as needed (Rash).) 180 g 0  . diltiazem (CARDIZEM CD) 180 MG 24 hr capsule Take 180 mg by mouth daily.    [name] diltiazem (TIAZAC) 180 MG 24 hr capsule TAKE 1 CAPSULE(180 MG) BY MOUTH DAILY    . fluticasone (FLONASE) 50 MCG/ACT nasal spray 1-2 sprays per nostril daily as needed (Patient taking differently: Place 2 sprays into both nostrils daily.) 48 g 1  . gabapentin (NEURONTIN) 400 MG capsule Take 400 mg by mouth 4 (four) times daily.    . hydrochlorothiazide (HYDRODIURIL) 25 MG tablet Take 25 mg by mouth daily.     [name] loratadine (CLARITIN) 10 MG  tablet Can take one tablet one to two times daily if needed for runny nose and itching. 180 tablet 1  . montelukast (SINGULAIR) 10 MG tablet TAKE 1 TABLET BY MOUTH EVERY DAY FOR COUGHING OR WHEEZING 90 tablet 1  . Multiple Vitamin (MULTIVITAMIN WITH MINERALS) TABS tablet Take 1 tablet by mouth daily.    . pantoprazole (PROTONIX) 40 MG tablet Take 1 tablet by mouth daily.    . rosuvastatin (CRESTOR) 20 MG tablet TAKE 1 TABLET(20 MG) BY MOUTH DAILY (Patient taking differently: Take 20 mg by mouth daily.) 90 tablet 3  . sertraline (ZOLOFT) 25 MG tablet Take 25 mg by mouth daily.  1  . tiZANidine (ZANAFLEX) 2 MG tablet Take 2 mg by mouth daily as needed for muscle spasms.     . Turmeric 500 MG CAPS Take 500 mg by mouth daily.    . valsartan (DIOVAN) 80 MG tablet Take 80 mg by mouth daily.    . diphenhydrAMINE (BENADRYL) 25 mg capsule Take 25 mg by mouth every 6 (six) hours as needed for allergies. (Patient not taking: Reported on 07/28/2020)     No current facility-administered medications for this visit.     Known medication allergies: Allergies  Allergen Reactions  . Robaxin [Methocarbamol] Rash  . Zyrtec [Cetirizine] Itching     Physical examination: Blood pressure 106/74, pulse 88, temperature 97.9 F (36.6 C), temperature source Temporal, resp. rate 12, height 5\' 5"  (1.651 m), weight 182 lb 5.1 oz (82.7 kg), SpO2 97 %.  General: Alert, interactive, in no acute distress. HEENT: PERRLA, TMs pearly gray, turbinates non-edematous with clear discharge, post-pharynx non erythematous. Neck: Supple without lymphadenopathy. Lungs: Clear to auscultation without wheezing, rhonchi or rales. {no increased work of breathing. CV: Normal S1, S2 without murmurs. Abdomen: Nondistended, nontender. Skin: Warm and dry, without lesions or rashes. Extremities:  No clubbing, cyanosis or edema. Neuro:   Grossly intact.  Diagnositics/Labs:  Spirometry: FEV1: 2.52L 123%, FVC: 2.71L 103%, ratio consistent with Nonobstructive pattern  ACT score of 21  Assessment and plan:   Acute Sinusitis Current symptoms consistent with acute sinusitis Will refrain from prednisone use due to cardiac history  take Azithromycin 500mg  day 1, then 250mg  day 2-5.  Azithromycin has anti-inflammatory properties that may help with your symptoms Can try Claritin-D or Mucinex-D (get from pharmacist) that has decongestant component to help with sinus pressure relief  Asthma Continue montelukast 10 mg once a day to prevent cough or wheeze  Continue albuterol 2 puffs every 4 hours as needed for cough or wheeze OR Instead use albuterol 0.083% solution via nebulizer one unit vial every 4 hours  as needed for cough or wheeze For asthma flare, begin Alvesco 80-2 puffs twice a day for 2 weeks or until cough and wheeze free.  Allergic rhinitis Continue allergen avoidance measures directed toward grass pollen, weed pollen, tree pollen, and dust mite as listed below Continue loratadine 10 mg once a day as needed for runny nose or itch.  You may take an additional 10 mg tablet for breakthrough symptoms Use Astelin 2 sprays each nostril twice a day for nasal drainage control Stop Flonase at this time Use nasal saline spray to help keep nose moisturized  Reflux Continue dietary and lifestyle modifications as listed below Continue pantoprazole once a day as previously prescribed  Call the clinic if this treatment plan is not working well for you  Follow up in 6 months or sooner if needed.  I appreciate the opportunity to take  part in [place] care. Please do not hesitate to contact me with questions.  Sincerely,   Prudy Feeler, MD Allergy/Immunology [facility] [place] [facility] [place]

## 2020-07-28 NOTE — Patient Instructions (Addendum)
Acute Sinusitis Current symptoms consistent with acute sinusitis Will refrain from prednisone Take Azithromycin 500mg  day 1, then 250mg  day 2-5.  Azithromycin has anti-inflammatory properties that may help with your symptoms Can try Claritin-D or Mucinex-D (get from pharmacist) that has decongestant component to help with sinus pressure relief  Asthma Continue montelukast 10 mg once a day to prevent cough or wheeze  Continue albuterol 2 puffs every 4 hours as needed for cough or wheeze OR Instead use albuterol 0.083% solution via nebulizer one unit vial every 4 hours as needed for cough or wheeze For asthma flare, begin Alvesco 80-2 puffs twice a day for 2 weeks or until cough and wheeze free.  Allergic rhinitis Continue allergen avoidance measures directed toward grass pollen, weed pollen, tree pollen, and dust mite as listed below Continue loratadine 10 mg once a day as needed for runny nose or itch.  You may take an additional 10 mg tablet for breakthrough symptoms Use Astelin 2 sprays each nostril twice a day for nasal drainage control Stop Flonase at this time Use nasal saline spray to help keep nose moisturized  Reflux Continue dietary and lifestyle modifications as listed below Continue pantoprazole once a day as previously prescribed  Call the clinic if this treatment plan is not working well for you  Follow up in 6 months or sooner if needed.
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
## 2020-08-03 ENCOUNTER — Inpatient Hospital Stay: Payer: PPO | Attending: Oncology | Admitting: Oncology

## 2020-08-03 ENCOUNTER — Other Ambulatory Visit: Payer: Self-pay

## 2020-08-03 VITALS — BP 126/84 | HR 76 | Temp 97.5°F | Resp 18 | Ht 65.0 in | Wt 184.6 lb

## 2020-08-03 DIAGNOSIS — I10 Essential (primary) hypertension: Secondary | ICD-10-CM | POA: Diagnosis not present

## 2020-08-03 DIAGNOSIS — Z923 Personal history of irradiation: Secondary | ICD-10-CM | POA: Diagnosis not present

## 2020-08-03 DIAGNOSIS — D0511 Intraductal carcinoma in situ of right breast: Secondary | ICD-10-CM | POA: Diagnosis not present

## 2020-08-03 DIAGNOSIS — G4733 Obstructive sleep apnea (adult) (pediatric): Secondary | ICD-10-CM | POA: Diagnosis not present

## 2020-08-03 MED ORDER — ANASTROZOLE 1 MG PO TABS: 1.0000 mg | ORAL_TABLET | Freq: Every day | ORAL | 4 refills | Status: DC

## 2020-08-05 ENCOUNTER — Other Ambulatory Visit (HOSPITAL_COMMUNITY): Payer: Self-pay

## 2020-08-05 ENCOUNTER — Telehealth: Payer: Self-pay

## 2020-08-05 ENCOUNTER — Telehealth: Payer: Self-pay | Admitting: Oncology

## 2020-08-05 DIAGNOSIS — D0511 Intraductal carcinoma in situ of right breast: Secondary | ICD-10-CM

## 2020-08-05 MED ORDER — ANASTROZOLE 1 MG PO TABS
1.0000 mg | ORAL_TABLET | Freq: Every day | ORAL | 4 refills | Status: DC
  Filled 2020-08-05: qty 90, 90d supply, fill #0
  Filled 2020-11-22: qty 90, 90d supply, fill #1
  Filled 2021-02-14: qty 90, 90d supply, fill #2
  Filled 2021-05-26: qty 90, 90d supply, fill #3
  Filled 2021-06-21: qty 30, 30d supply, fill #4
  Filled 2021-06-23 – 2021-06-24 (×2): qty 90, 90d supply, fill #4

## 2020-08-05 MED ORDER — ANASTROZOLE 1 MG PO TABS: 1.0000 mg | ORAL_TABLET | Freq: Every day | ORAL | 4 refills | Status: DC

## 2020-08-05 NOTE — Telephone Encounter (Signed)
Scheduled appt per 4/5 los. Pt aware.

## 2020-08-05 NOTE — Telephone Encounter (Signed)
Returned call to pt regarding changing the pharmacy where her anastrozole was sent. Pt wanted it sent to the [facility] outpatient pharmacy. Prescription sent.

## 2020-08-11 ENCOUNTER — Other Ambulatory Visit (HOSPITAL_COMMUNITY): Payer: Self-pay

## 2020-08-11 DIAGNOSIS — C50911 Malignant neoplasm of unspecified site of right female breast: Secondary | ICD-10-CM | POA: Diagnosis not present

## 2020-08-23 DIAGNOSIS — H52203 Unspecified astigmatism, bilateral: Secondary | ICD-10-CM | POA: Diagnosis not present

## 2020-08-23 DIAGNOSIS — H43393 Other vitreous opacities, bilateral: Secondary | ICD-10-CM | POA: Diagnosis not present

## 2020-08-23 DIAGNOSIS — H04123 Dry eye syndrome of bilateral lacrimal glands: Secondary | ICD-10-CM | POA: Diagnosis not present

## 2020-08-23 DIAGNOSIS — H524 Presbyopia: Secondary | ICD-10-CM | POA: Diagnosis not present

## 2020-08-23 DIAGNOSIS — H2513 Age-related nuclear cataract, bilateral: Secondary | ICD-10-CM | POA: Diagnosis not present

## 2020-08-23 DIAGNOSIS — H5213 Myopia, bilateral: Secondary | ICD-10-CM | POA: Diagnosis not present

## 2020-08-23 DIAGNOSIS — H25013 Cortical age-related cataract, bilateral: Secondary | ICD-10-CM | POA: Diagnosis not present

## 2020-08-28 DIAGNOSIS — J4540 Moderate persistent asthma, uncomplicated: Secondary | ICD-10-CM | POA: Diagnosis not present

## 2020-09-03 ENCOUNTER — Telehealth: Payer: Self-pay | Admitting: Allergy

## 2020-09-03 NOTE — Telephone Encounter (Signed)
Called and spoke with Mavas at [employer] in the [place] office. She stated that the bill for the nebulizer and the spacer is still on hold and there is no denial yet. Tried to call patient to give her the information but no answer. Left message for patient to call office.

## 2020-09-03 NOTE — Telephone Encounter (Signed)
Pt states Ins states that the wrong codes were submitted for nebulizer and spacer.

## 2020-09-03 NOTE — Telephone Encounter (Signed)
Spoke with patient and gave her the message from [employer]. If she has any issues with billing she can call us back so we can look into what dx they need to get it approved.

## 2020-09-08 DIAGNOSIS — C50911 Malignant neoplasm of unspecified site of right female breast: Secondary | ICD-10-CM | POA: Diagnosis not present

## 2020-09-10 DIAGNOSIS — Z01419 Encounter for gynecological examination (general) (routine) without abnormal findings: Secondary | ICD-10-CM | POA: Diagnosis not present

## 2020-09-10 DIAGNOSIS — Z124 Encounter for screening for malignant neoplasm of cervix: Secondary | ICD-10-CM | POA: Diagnosis not present

## 2020-09-27 DIAGNOSIS — J4540 Moderate persistent asthma, uncomplicated: Secondary | ICD-10-CM | POA: Diagnosis not present

## 2020-10-03 NOTE — Progress Notes (Signed)
  Radiation Oncology         [phone] ________________________________  Name: Rachael Osborn MRN: [id number]  Date of Service: 10/04/2020  DOB: 1956/01/11  Post Treatment Telephone Note  Diagnosis:   High Grade ER positive DCIS of the right breast  Interval Since Last Radiation:  12 weeks   06/23/2020 through 07/20/2020 Site Technique Total Dose (Gy) Dose per Fx (Gy) Completed Fx Beam Energies  Breast, Right: Breast_Rt 3D 42.56/42.56 2.66 16/16 6X, 10X  Breast, Right: Breast_Rt_Bst 3D 8/8 2 4/4 6X, 10X     Narrative:  The patient was contacted today for routine follow-up. During treatment she did very well with radiotherapy and did not have significant desquamation.  Impression/Plan: 1. High Grade ER positive DCIS of the right breast. I was unable to reach the patient but left a voicemail for her and on the message I discussed that we would be happy to continue to follow her as needed, but she will also continue to follow up with Dr. Jana Hakim in medical oncology. She was counseled on skin care as well as measures to avoid sun exposure to this area.        Carola Rhine, PAC

## 2020-10-04 ENCOUNTER — Ambulatory Visit
Admission: RE | Admit: 2020-10-04 | Discharge: 2020-10-04 | Disposition: A | Discharge status: Home / Self Care | Payer: PPO | Source: Ambulatory Visit | Attending: Radiation Oncology | Admitting: Radiation Oncology

## 2020-10-04 DIAGNOSIS — D0511 Intraductal carcinoma in situ of right breast: Secondary | ICD-10-CM
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
## 2020-10-26 ENCOUNTER — Inpatient Hospital Stay: Payer: PPO | Attending: Oncology | Admitting: Oncology

## 2020-10-26 DIAGNOSIS — D0511 Intraductal carcinoma in situ of right breast: Secondary | ICD-10-CM

## 2020-10-28 ENCOUNTER — Telehealth: Payer: Self-pay | Admitting: Oncology

## 2020-10-28 DIAGNOSIS — J4540 Moderate persistent asthma, uncomplicated: Secondary | ICD-10-CM | POA: Diagnosis not present

## 2020-10-28 NOTE — Telephone Encounter (Signed)
Scheduled appointment per 06/28 los. Patient is aware.

## 2020-11-01 DIAGNOSIS — J069 Acute upper respiratory infection, unspecified: Secondary | ICD-10-CM | POA: Diagnosis not present

## 2020-11-09 ENCOUNTER — Encounter: Payer: Self-pay | Admitting: Family Medicine

## 2020-11-09 ENCOUNTER — Ambulatory Visit (INDEPENDENT_AMBULATORY_CARE_PROVIDER_SITE_OTHER): Payer: PPO | Admitting: Family Medicine

## 2020-11-09 ENCOUNTER — Other Ambulatory Visit: Payer: Self-pay

## 2020-11-09 ENCOUNTER — Telehealth: Payer: Self-pay

## 2020-11-09 DIAGNOSIS — J4530 Mild persistent asthma, uncomplicated: Secondary | ICD-10-CM | POA: Diagnosis not present

## 2020-11-09 DIAGNOSIS — J3089 Other allergic rhinitis: Secondary | ICD-10-CM

## 2020-11-09 DIAGNOSIS — K219 Gastro-esophageal reflux disease without esophagitis: Secondary | ICD-10-CM

## 2020-11-09 DIAGNOSIS — C50911 Malignant neoplasm of unspecified site of right female breast: Secondary | ICD-10-CM | POA: Diagnosis not present

## 2020-11-09 DIAGNOSIS — J302 Other seasonal allergic rhinitis: Secondary | ICD-10-CM | POA: Diagnosis not present

## 2020-11-09 NOTE — Patient Instructions (Addendum)
Asthma Continue montelukast 10 mg once a day to prevent cough or wheeze  Continue albuterol 2 puffs every 4 hours as needed for cough or wheeze OR Instead use albuterol 0.083% solution via nebulizer one unit vial every 4 hours as needed for cough or wheeze For asthma flare, begin [place] 80-2 puffs twice a day for 2 weeks or until cough and wheeze free.  Allergic rhinitis Continue allergen avoidance measures directed toward grass pollen, weed pollen, tree pollen, and dust mite as listed below Begin Mucincex (guaifenesin) [phone] mg twice a day and increase hydration as tolerated in order to thin mucus Stop loratadine 10 mg for the next 5 days, then continue loratadine 10 mg once a day as needed for runny nose or itch.  You may take an additional 10 mg tablet for breakthrough symptoms Continue Flonase nasal spray 2 sprays in each nostril once a day as needed for stuffy nose.  In the right nostril, point the applicator out toward the right ear. In the left nostril, point the applicator out toward the left ear.  This stop using this medication if you get a nosebleed Continue azelastine 1 to 2 sprays in each nostril twice a day as needed for runny nose  Consider saline nasal rinses or saline mist as needed for nasal symptoms. Use this before any medicated nasal sprays for best result  Reflux Continue dietary and lifestyle modifications as listed below Continue pantoprazole once a day as previously prescribed  Call the clinic if this treatment plan is not working well for you  Follow up in 6 months or sooner if needed.  Reducing Pollen Exposure The American Academy of Allergy, Asthma and Immunology suggests the following steps to reduce your exposure to pollen during allergy seasons. Do not hang sheets or clothing out to dry; pollen may collect on these items. Do not mow lawns or spend time around freshly cut grass; mowing stirs up pollen. Keep windows closed at night.  Keep car windows closed  while driving. Minimize morning activities outdoors, a time when pollen counts are usually at their highest. Stay indoors as much as possible when pollen counts or humidity is high and on windy days when pollen tends to remain in the air longer. Use air conditioning when possible.  Many air conditioners have filters that trap the pollen spores. Use a HEPA room air filter to remove pollen form the indoor air you breathe.   Control of Dust Mite Allergen Dust mites play a major role in allergic asthma and rhinitis. They occur in environments with high humidity wherever human skin is found. Dust mites absorb humidity from the atmosphere (ie, they do not drink) and feed on organic matter (including shed human and animal skin). Dust mites are a microscopic type of insect that you cannot see with the naked eye. High levels of dust mites have been detected from mattresses, pillows, carpets, upholstered furniture, bed covers, clothes, soft toys and any woven material. The principal allergen of the dust mite is found in its feces. A gram of dust may contain 1,000 mites and 250,000 fecal particles. Mite antigen is easily measured in the air during house cleaning activities. Dust mites do not bite and do not cause harm to humans, other than by triggering allergies/asthma.  Ways to decrease your exposure to dust mites in your home:  1. Encase mattresses, box springs and pillows with a mite-impermeable barrier or cover  2. Wash sheets, blankets and drapes weekly in hot water (130 F) with  detergent and dry them in a dryer on the hot setting.  3. Have the room cleaned frequently with a vacuum cleaner and a damp dust-mop. For carpeting or rugs, vacuuming with a vacuum cleaner equipped with a high-efficiency particulate air (HEPA) filter. The dust mite allergic individual should not be in a room which is being cleaned and should wait 1 hour after cleaning before going into the room.  4. Do not sleep on upholstered  furniture (eg, couches).  5. If possible removing carpeting, upholstered furniture and drapery from the home is ideal. Horizontal blinds should be eliminated in the rooms where the person spends the most time (bedroom, study, television room). Washable vinyl, roller-type shades are optimal.  6. Remove all non-washable stuffed toys from the bedroom. Wash stuffed toys weekly like sheets and blankets above.  7. Reduce indoor humidity to less than 50%. Inexpensive humidity monitors can be purchased at most hardware stores. Do not use a humidifier as can make the problem worse and are not recommended.  Lifestyle Changes for Controlling GERD When you have GERD, stomach acid feels as if it's backing up toward your mouth. Whether or not you take medication to control your GERD, your symptoms can often be improved with lifestyle changes.   Raise Your Head Reflux is more likely to strike when you're lying down flat, because stomach fluid can flow backward more easily. Raising the head of your bed 4-6 inches can help. To do this: Slide blocks or books under the legs at the head of your bed. Or, place a wedge under the mattress. Many foam stores can make a suitable wedge for you. The wedge should run from your waist to the top of your head. Don't just prop your head on several pillows. This increases pressure on your stomach. It can make GERD worse.  Watch Your Eating Habits Certain foods may increase the acid in your stomach or relax the lower esophageal sphincter, making GERD more likely. It's best to avoid the following: Coffee, tea, and carbonated drinks (with and without caffeine) Fatty, fried, or spicy food Mint, chocolate, onions, and tomatoes Any other foods that seem to irritate your stomach or cause you pain  Relieve the Pressure Eat smaller meals, even if you have to eat more often. Don't lie down right after you eat. Wait a few hours for your stomach to empty. Avoid tight belts and  tight-fitting clothes. Lose excess weight.  Tobacco and Alcohol Avoid smoking tobacco and drinking alcohol. They can make GERD symptoms worse.

## 2020-11-09 NOTE — Progress Notes (Signed)
RE: PAILYNN VAHEY MRN: [id number] DOB: 11-28-1955 Date of Telemedicine Visit: 11/09/2020  Referring provider: [name], DO Primary care provider: [name], DO  Chief Complaint: Cough   Telemedicine Follow Up Visit via Telephone: I connected with Serene Kopf for a follow up on 11/09/20 by telephone and verified that I am speaking with the correct person using two identifiers.   I discussed the limitations, risks, security and privacy concerns of performing an evaluation and management service by telephone and the availability of in person appointments. I also discussed with the patient that there may be a patient responsible charge related to this service. The patient expressed understanding and agreed to proceed.  Patient is at home  Provider is at the office.  Visit start time: 1497 Visit end time: 46 Insurance consent/check in by: [employer] consent and medical assistant/nurse: Jarrett Soho  History of Present Illness: She is a 65 y.o. female, who is being followed for asthma, allergic rhinitis, and reflux. Her previous allergy office visit was on 07/28/2020 with Dr. Neldon Mc.  In the interim, she notes that she began to experience laryngitis on July 3 and subsequently took a home COVID test which was negative.  On July 4 she went to urgent care and was prescribed azithromycin and Tessalon Perles for her symptoms.  At that time she had a rapid COVID test that was negative as well.  She reports that she continues to experience symptoms with no relief from the azithromycin and mild relief from [employer].  At today's visit, she reports her asthma is well controlled with no shortness of breath or wheeze with activity or rest.  She does report some cough mostly occurring at night and producing clear mucus.  She has used her albuterol inhaler 2 times with relief of symptoms.  Allergic rhinitis is reported as poorly controlled with symptoms including clear rhinorrhea, nasal  congestion which is worse at night, frequent sneezing, and postnasal drainage with frequent throat clearing.  She continues Claritin daily, Flonase and azelastine daily, and saline nasal rinse as needed.  She has not used saline nasal rinses recently.  She does report that while she has had postnasal drainage and symptoms of allergic rhinitis she has occasionally seen streaks of blood mixed with mucus on her tissue after blowing.  She has not had any epistaxis lasting longer than 5 minutes.  She reports excellent application technique with Flonase.  She reports that she has dust mite covers in place on her bedding and pillows.  She reports that she had and received allergy injections for years with no perceived benefit.  She denies fever, sinus pain or pressure.  Reflux is reported as well controlled with pantoprazole as previously prescribed.  Her current medications are listed in the chart.  Assessment and Plan: K[place]ia is a 65 y.o. female with: Patient Instructions  Asthma Continue montelukast 10 mg once a day to prevent cough or wheeze  Continue albuterol 2 puffs every 4 hours as needed for cough or wheeze OR Instead use albuterol 0.083% solution via nebulizer one unit vial every 4 hours as needed for cough or wheeze For asthma flare, begin Alvesco 80-2 puffs twice a day for 2 weeks or until cough and wheeze free.  Allergic rhinitis Continue allergen avoidance measures directed toward grass pollen, weed pollen, tree pollen, and dust mite as listed below Begin Mucincex (guaifenesin) [phone] mg twice a day and increase hydration as tolerated in order to thin mucus Stop loratadine 10 mg  for the next 5 days, then continue loratadine 10 mg once a day as needed for runny nose or itch.  You may take an additional 10 mg tablet for breakthrough symptoms Continue Flonase nasal spray 2 sprays in each nostril once a day as needed for stuffy nose.  In the right nostril, point the applicator out toward the right  ear. In the left nostril, point the applicator out toward the left ear Continue azelastine 1 to 2 sprays in each nostril twice a day as needed for runny nose  Consider saline nasal rinses or saline mist as needed for nasal symptoms. Use this before any medicated nasal sprays for best result  Reflux Continue dietary and lifestyle modifications as listed below Continue pantoprazole once a day as previously prescribed  Call the clinic if this treatment plan is not working well for you  Follow up in 6 months or sooner if needed.  Return in about 6 months (around 05/12/2021), or if symptoms worsen or fail to improve.   Medication List:  Current Outpatient Medications  Medication Sig Dispense Refill   albuterol (PROVENTIL) (2.5 MG/3ML) 0.083% nebulizer solution Take 3 mLs (2.5 mg total) by nebulization every 6 (six) hours as needed for wheezing or shortness of breath. 225 mL 1   albuterol (VENTOLIN HFA) 108 (90 Base) MCG/ACT inhaler Inhale 2 puffs into the lungs every 4 (four) hours as needed for wheezing or shortness of breath. 3 each 0   anastrozole (ARIMIDEX) 1 MG tablet Take 1 tablet (1 mg total) by mouth daily. 90 tablet 4   aspirin EC 81 MG tablet Take 81 mg by mouth daily.     azelastine (ASTELIN) 0.1 % nasal spray Place 2 sprays into both nostrils 2 (two) times daily. 90 mL 1   benzonatate (TESSALON) 100 MG capsule Take by mouth.     calcium-vitamin D (OSCAL WITH D) 500-200 MG-UNIT TABS tablet Take 1 tablet by mouth daily.     Crisaborole (EUCRISA) 2 % OINT Apply 1 application topically 2 (two) times daily. (Patient taking differently: Apply 1 application topically daily as needed (Rash).) 180 g 0   diltiazem (CARDIZEM CD) 180 MG 24 hr capsule Take 180 mg by mouth daily.     diphenhydrAMINE (BENADRYL) 25 mg capsule Take 25 mg by mouth every 6 (six) hours as needed for allergies.     fluticasone (FLONASE) 50 MCG/ACT nasal spray 1-2 sprays per nostril daily as needed (Patient taking  differently: Place 2 sprays into both nostrils daily.) 48 g 1   gabapentin (NEURONTIN) 400 MG capsule Take 400 mg by mouth 4 (four) times daily.     hydrochlorothiazide (HYDRODIURIL) 25 MG tablet Take 25 mg by mouth daily.      loratadine (CLARITIN) 10 MG tablet Can take one tablet one to two times daily if needed for runny nose and itching. 180 tablet 1   montelukast (SINGULAIR) 10 MG tablet TAKE 1 TABLET BY MOUTH EVERY DAY FOR COUGHING OR WHEEZING 90 tablet 1   Multiple Vitamin (MULTIVITAMIN WITH MINERALS) TABS tablet Take 1 tablet by mouth daily.     pantoprazole (PROTONIX) 40 MG tablet Take 1 tablet once daily for reflux 90 tablet 1   rosuvastatin (CRESTOR) 20 MG tablet TAKE 1 TABLET(20 MG) BY MOUTH DAILY (Patient taking differently: Take 20 mg by mouth daily.) 90 tablet 3   sertraline (ZOLOFT) 25 MG tablet Take 25 mg by mouth daily.  1   tiZANidine (ZANAFLEX) 2 MG tablet Take 2 mg by  mouth daily as needed for muscle spasms.     Turmeric 500 MG CAPS Take 500 mg by mouth daily.     valsartan (DIOVAN) 80 MG tablet Take 80 mg by mouth daily.     No current facility-administered medications for this visit.   Allergies: Allergies  Allergen Reactions   Robaxin [Methocarbamol] Rash   Zyrtec [Cetirizine] Itching   I reviewed her past medical history, social history, family history, and environmental history and no significant changes have been reported from previous visit on 07/28/2020.  Objective: Physical Exam Not obtained as encounter was done via telephone.   Previous notes and tests were reviewed.  I discussed the assessment and treatment plan with the patient. The patient was provided an opportunity to ask questions and all were answered. The patient agreed with the plan and demonstrated an understanding of the instructions.   The patient was advised to call back or seek an in-person evaluation if the symptoms worsen or if the condition fails to improve as anticipated.  I provided  25 minutes of non-face-to-face time during this encounter.  It was my pleasure to participate in [facility] care today. Please feel free to contact me with any questions or concerns.   Sincerely,  Gareth Morgan, FNP

## 2020-11-09 NOTE — Telephone Encounter (Signed)
Spoke with Patient regarding long term disability forms received from [employer]. She stated that she did not want this paperwork completed because "they just wanted too much information." Reassured patient that it was no trouble to complete the form and she again stated that she did not want the forms completed. She stated that she may have other forms in the future that she would want to have completed and would bring them in or would mail them.

## 2020-11-22 ENCOUNTER — Other Ambulatory Visit (HOSPITAL_COMMUNITY): Payer: Self-pay

## 2020-11-27 DIAGNOSIS — J4540 Moderate persistent asthma, uncomplicated: Secondary | ICD-10-CM | POA: Diagnosis not present

## 2020-12-01 DIAGNOSIS — D0511 Intraductal carcinoma in situ of right breast: Secondary | ICD-10-CM | POA: Diagnosis not present

## 2020-12-01 DIAGNOSIS — R7303 Prediabetes: Secondary | ICD-10-CM | POA: Diagnosis not present

## 2020-12-01 DIAGNOSIS — I471 Supraventricular tachycardia: Secondary | ICD-10-CM | POA: Diagnosis not present

## 2020-12-01 DIAGNOSIS — E559 Vitamin D deficiency, unspecified: Secondary | ICD-10-CM | POA: Diagnosis not present

## 2020-12-01 DIAGNOSIS — E785 Hyperlipidemia, unspecified: Secondary | ICD-10-CM | POA: Diagnosis not present

## 2020-12-01 DIAGNOSIS — J4540 Moderate persistent asthma, uncomplicated: Secondary | ICD-10-CM | POA: Diagnosis not present

## 2020-12-01 DIAGNOSIS — Z0000 Encounter for general adult medical examination without abnormal findings: Secondary | ICD-10-CM | POA: Diagnosis not present

## 2020-12-01 DIAGNOSIS — K219 Gastro-esophageal reflux disease without esophagitis: Secondary | ICD-10-CM | POA: Diagnosis not present

## 2020-12-01 DIAGNOSIS — F411 Generalized anxiety disorder: Secondary | ICD-10-CM | POA: Diagnosis not present

## 2020-12-01 DIAGNOSIS — Z5181 Encounter for therapeutic drug level monitoring: Secondary | ICD-10-CM | POA: Diagnosis not present

## 2020-12-01 DIAGNOSIS — I10 Essential (primary) hypertension: Secondary | ICD-10-CM | POA: Diagnosis not present

## 2020-12-01 DIAGNOSIS — J302 Other seasonal allergic rhinitis: Secondary | ICD-10-CM | POA: Diagnosis not present

## 2020-12-09 ENCOUNTER — Encounter: Payer: Self-pay | Admitting: *Deleted

## 2020-12-28 DIAGNOSIS — J4540 Moderate persistent asthma, uncomplicated: Secondary | ICD-10-CM | POA: Diagnosis not present

## 2021-01-17 DIAGNOSIS — G4733 Obstructive sleep apnea (adult) (pediatric): Secondary | ICD-10-CM | POA: Diagnosis not present

## 2021-01-17 DIAGNOSIS — D0511 Intraductal carcinoma in situ of right breast: Secondary | ICD-10-CM | POA: Diagnosis not present

## 2021-01-17 DIAGNOSIS — Z9889 Other specified postprocedural states: Secondary | ICD-10-CM | POA: Diagnosis not present

## 2021-01-17 DIAGNOSIS — I4510 Unspecified right bundle-branch block: Secondary | ICD-10-CM | POA: Diagnosis not present

## 2021-01-17 DIAGNOSIS — E782 Mixed hyperlipidemia: Secondary | ICD-10-CM | POA: Diagnosis not present

## 2021-01-17 DIAGNOSIS — I471 Supraventricular tachycardia: Secondary | ICD-10-CM | POA: Diagnosis not present

## 2021-01-17 DIAGNOSIS — I10 Essential (primary) hypertension: Secondary | ICD-10-CM | POA: Diagnosis not present

## 2021-01-24 DIAGNOSIS — I471 Supraventricular tachycardia: Secondary | ICD-10-CM | POA: Diagnosis not present

## 2021-01-28 ENCOUNTER — Encounter: Payer: Self-pay | Admitting: Physical Therapy

## 2021-01-28 ENCOUNTER — Other Ambulatory Visit: Payer: Self-pay

## 2021-01-28 ENCOUNTER — Ambulatory Visit: Payer: No Typology Code available for payment source | Attending: Specialist | Admitting: Physical Therapy

## 2021-01-28 DIAGNOSIS — G8929 Other chronic pain: Secondary | ICD-10-CM | POA: Insufficient documentation

## 2021-01-28 DIAGNOSIS — R262 Difficulty in walking, not elsewhere classified: Secondary | ICD-10-CM | POA: Diagnosis present

## 2021-01-28 DIAGNOSIS — M6281 Muscle weakness (generalized): Secondary | ICD-10-CM | POA: Insufficient documentation

## 2021-01-28 DIAGNOSIS — M25562 Pain in left knee: Secondary | ICD-10-CM | POA: Diagnosis present

## 2021-01-28 DIAGNOSIS — M25661 Stiffness of right knee, not elsewhere classified: Secondary | ICD-10-CM | POA: Insufficient documentation

## 2021-01-28 DIAGNOSIS — M25561 Pain in right knee: Secondary | ICD-10-CM | POA: Diagnosis present

## 2021-01-28 DIAGNOSIS — J4540 Moderate persistent asthma, uncomplicated: Secondary | ICD-10-CM | POA: Diagnosis not present
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
## 2021-01-28 NOTE — Patient Instructions (Signed)
Access Code: [email or web address] URL: [email or web address] Date: 01/28/2021 Prepared by: Grayling Congress  Exercises Supine Heel Slide with Strap - 2 x daily - 7 x weekly - 2 sets - 10 reps - 3 sec hold Supine Active Straight Leg Raise - 2 x daily - 7 x weekly - 2 sets - 10 reps Seated Long Arc Quad - 2 x daily - 7 x weekly - 2 sets - 10 reps Seated Hamstring Curl with Anchored Resistance - 2 x daily - 7 x weekly - 2 sets - 10 reps Sit to Stand - 2 x daily - 7 x weekly - 2 sets - 10 reps

## 2021-01-31 ENCOUNTER — Encounter: Payer: Self-pay | Admitting: Oncology

## 2021-01-31 DIAGNOSIS — Z853 Personal history of malignant neoplasm of breast: Secondary | ICD-10-CM | POA: Diagnosis not present

## 2021-02-02 ENCOUNTER — Encounter: Payer: Self-pay | Admitting: Physical Therapy

## 2021-02-02 ENCOUNTER — Ambulatory Visit: Payer: No Typology Code available for payment source | Attending: Specialist | Admitting: Physical Therapy

## 2021-02-02 ENCOUNTER — Other Ambulatory Visit: Payer: Self-pay

## 2021-02-02 DIAGNOSIS — M25561 Pain in right knee: Secondary | ICD-10-CM | POA: Insufficient documentation

## 2021-02-02 DIAGNOSIS — M6281 Muscle weakness (generalized): Secondary | ICD-10-CM | POA: Diagnosis present

## 2021-02-02 DIAGNOSIS — R262 Difficulty in walking, not elsewhere classified: Secondary | ICD-10-CM | POA: Diagnosis present

## 2021-02-02 DIAGNOSIS — G8929 Other chronic pain: Secondary | ICD-10-CM | POA: Diagnosis present

## 2021-02-02 DIAGNOSIS — M25562 Pain in left knee: Secondary | ICD-10-CM | POA: Insufficient documentation

## 2021-02-02 DIAGNOSIS — M25661 Stiffness of right knee, not elsewhere classified: Secondary | ICD-10-CM | POA: Diagnosis present
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
## 2021-02-04 ENCOUNTER — Other Ambulatory Visit: Payer: Self-pay

## 2021-02-04 ENCOUNTER — Ambulatory Visit: Payer: No Typology Code available for payment source | Admitting: Physical Therapy

## 2021-02-04 ENCOUNTER — Encounter: Payer: Self-pay | Admitting: Physical Therapy

## 2021-02-04 DIAGNOSIS — R262 Difficulty in walking, not elsewhere classified: Secondary | ICD-10-CM

## 2021-02-04 DIAGNOSIS — M6281 Muscle weakness (generalized): Secondary | ICD-10-CM

## 2021-02-04 DIAGNOSIS — M25561 Pain in right knee: Secondary | ICD-10-CM

## 2021-02-04 DIAGNOSIS — G8929 Other chronic pain: Secondary | ICD-10-CM

## 2021-02-04 DIAGNOSIS — M25562 Pain in left knee: Secondary | ICD-10-CM

## 2021-02-04 DIAGNOSIS — M25661 Stiffness of right knee, not elsewhere classified: Secondary | ICD-10-CM
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
## 2021-02-08 ENCOUNTER — Ambulatory Visit: Payer: No Typology Code available for payment source | Admitting: Physical Therapy

## 2021-02-08 ENCOUNTER — Encounter: Payer: Self-pay | Admitting: Physical Therapy

## 2021-02-08 ENCOUNTER — Other Ambulatory Visit: Payer: Self-pay

## 2021-02-08 DIAGNOSIS — G8929 Other chronic pain: Secondary | ICD-10-CM

## 2021-02-08 DIAGNOSIS — M25561 Pain in right knee: Secondary | ICD-10-CM

## 2021-02-08 DIAGNOSIS — M25562 Pain in left knee: Secondary | ICD-10-CM

## 2021-02-08 DIAGNOSIS — R262 Difficulty in walking, not elsewhere classified: Secondary | ICD-10-CM

## 2021-02-08 DIAGNOSIS — M6281 Muscle weakness (generalized): Secondary | ICD-10-CM

## 2021-02-08 DIAGNOSIS — M25661 Stiffness of right knee, not elsewhere classified: Secondary | ICD-10-CM
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
## 2021-02-10 ENCOUNTER — Other Ambulatory Visit: Payer: Self-pay

## 2021-02-10 ENCOUNTER — Other Ambulatory Visit: Payer: Self-pay | Admitting: Allergy

## 2021-02-10 ENCOUNTER — Ambulatory Visit: Payer: No Typology Code available for payment source | Admitting: Physical Therapy

## 2021-02-10 ENCOUNTER — Encounter: Payer: Self-pay | Admitting: Physical Therapy

## 2021-02-10 DIAGNOSIS — M25561 Pain in right knee: Secondary | ICD-10-CM | POA: Diagnosis not present

## 2021-02-10 DIAGNOSIS — M25661 Stiffness of right knee, not elsewhere classified: Secondary | ICD-10-CM

## 2021-02-10 DIAGNOSIS — R262 Difficulty in walking, not elsewhere classified: Secondary | ICD-10-CM

## 2021-02-10 DIAGNOSIS — G8929 Other chronic pain: Secondary | ICD-10-CM

## 2021-02-10 DIAGNOSIS — M6281 Muscle weakness (generalized): Secondary | ICD-10-CM
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
## 2021-02-10 NOTE — Patient Instructions (Signed)
Access Code: [email or web address] URL: [email or web address] Date: 02/10/2021 Prepared by: Ethel Rana  Exercises Supine Hamstring Stretch with Strap - 1 x daily - 7 x weekly - 3 sets - 10 reps - 30 sec hold Active Straight Leg Raise with Quad Set - 1 x daily - 7 x weekly - 1 sets - 10 reps Supine Bridge - 1 x daily - 7 x weekly - 1 sets - 10 reps Hooklying Isometric Hip Abduction Adduction with Belt and Ball - 1 x daily - 7 x weekly - 1 sets - 10 reps

## 2021-02-14 ENCOUNTER — Other Ambulatory Visit: Payer: Self-pay

## 2021-02-14 ENCOUNTER — Ambulatory Visit: Payer: No Typology Code available for payment source | Admitting: Physical Therapy

## 2021-02-14 ENCOUNTER — Encounter: Payer: Self-pay | Admitting: Physical Therapy

## 2021-02-14 ENCOUNTER — Other Ambulatory Visit (HOSPITAL_COMMUNITY): Payer: Self-pay

## 2021-02-14 DIAGNOSIS — M25561 Pain in right knee: Secondary | ICD-10-CM | POA: Diagnosis not present

## 2021-02-14 DIAGNOSIS — M6281 Muscle weakness (generalized): Secondary | ICD-10-CM

## 2021-02-14 DIAGNOSIS — M25661 Stiffness of right knee, not elsewhere classified: Secondary | ICD-10-CM

## 2021-02-14 DIAGNOSIS — G8929 Other chronic pain: Secondary | ICD-10-CM

## 2021-02-14 DIAGNOSIS — R262 Difficulty in walking, not elsewhere classified: Secondary | ICD-10-CM
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
## 2021-02-14 NOTE — Patient Instructions (Signed)
HEP unchanged

## 2021-02-15 ENCOUNTER — Inpatient Hospital Stay: Payer: PPO | Attending: Oncology | Admitting: Oncology

## 2021-02-15 VITALS — BP 124/76 | HR 92 | Temp 97.9°F | Resp 18 | Ht 65.0 in | Wt 191.3 lb

## 2021-02-15 DIAGNOSIS — Z923 Personal history of irradiation: Secondary | ICD-10-CM | POA: Insufficient documentation

## 2021-02-15 DIAGNOSIS — D0511 Intraductal carcinoma in situ of right breast: Secondary | ICD-10-CM | POA: Diagnosis not present

## 2021-02-15 DIAGNOSIS — C50911 Malignant neoplasm of unspecified site of right female breast: Secondary | ICD-10-CM | POA: Diagnosis not present
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
## 2021-02-16 ENCOUNTER — Ambulatory Visit: Payer: No Typology Code available for payment source | Admitting: Physical Therapy

## 2021-02-16 ENCOUNTER — Encounter: Payer: Self-pay | Admitting: Physical Therapy

## 2021-02-16 ENCOUNTER — Other Ambulatory Visit: Payer: Self-pay

## 2021-02-16 DIAGNOSIS — G8929 Other chronic pain: Secondary | ICD-10-CM

## 2021-02-16 DIAGNOSIS — M25561 Pain in right knee: Secondary | ICD-10-CM | POA: Diagnosis not present

## 2021-02-16 DIAGNOSIS — R262 Difficulty in walking, not elsewhere classified: Secondary | ICD-10-CM

## 2021-02-16 DIAGNOSIS — M6281 Muscle weakness (generalized): Secondary | ICD-10-CM

## 2021-02-16 DIAGNOSIS — M25661 Stiffness of right knee, not elsewhere classified: Secondary | ICD-10-CM
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
## 2021-02-21 ENCOUNTER — Encounter: Payer: Self-pay | Admitting: Physical Therapy

## 2021-02-21 ENCOUNTER — Ambulatory Visit: Payer: No Typology Code available for payment source | Attending: Specialist | Admitting: Physical Therapy

## 2021-02-21 ENCOUNTER — Other Ambulatory Visit: Payer: Self-pay

## 2021-02-21 DIAGNOSIS — M25562 Pain in left knee: Secondary | ICD-10-CM | POA: Insufficient documentation

## 2021-02-21 DIAGNOSIS — R262 Difficulty in walking, not elsewhere classified: Secondary | ICD-10-CM | POA: Insufficient documentation

## 2021-02-21 DIAGNOSIS — M25561 Pain in right knee: Secondary | ICD-10-CM | POA: Diagnosis not present

## 2021-02-21 DIAGNOSIS — G8929 Other chronic pain: Secondary | ICD-10-CM | POA: Insufficient documentation

## 2021-02-21 DIAGNOSIS — M6281 Muscle weakness (generalized): Secondary | ICD-10-CM | POA: Insufficient documentation

## 2021-02-21 DIAGNOSIS — M25661 Stiffness of right knee, not elsewhere classified: Secondary | ICD-10-CM | POA: Insufficient documentation
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
## 2021-02-23 ENCOUNTER — Encounter: Payer: Self-pay | Admitting: Physical Therapy

## 2021-02-23 ENCOUNTER — Other Ambulatory Visit: Payer: Self-pay

## 2021-02-23 ENCOUNTER — Ambulatory Visit: Payer: No Typology Code available for payment source | Admitting: Physical Therapy

## 2021-02-23 DIAGNOSIS — M25562 Pain in left knee: Secondary | ICD-10-CM

## 2021-02-23 DIAGNOSIS — M25561 Pain in right knee: Secondary | ICD-10-CM | POA: Diagnosis not present

## 2021-02-23 DIAGNOSIS — R262 Difficulty in walking, not elsewhere classified: Secondary | ICD-10-CM

## 2021-02-23 DIAGNOSIS — M6281 Muscle weakness (generalized): Secondary | ICD-10-CM

## 2021-02-23 DIAGNOSIS — M25661 Stiffness of right knee, not elsewhere classified: Secondary | ICD-10-CM
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
## 2021-02-23 NOTE — Patient Instructions (Signed)
Access Code: [email or web address] URL: [email or web address] Date: 02/23/2021 Prepared by: Ethel Rana  Exercises Supine Bridge - 1 x daily - 7 x weekly - 2 sets - 10 reps Clamshell with Resistance - 1 x daily - 7 x weekly - 2 sets - 10 reps Supine Active Straight Leg Raise - 1 x daily - 7 x weekly - 2 sets - 10 reps Runner's Step Up/Down - 1 x daily - 7 x weekly - 3 sets - 10 reps Lateral Step Up - 1 x daily - 7 x weekly - 3 sets - 10 reps Crossover Step Up - 1 x daily - 7 x weekly - 3 sets - 10 reps Standing 3-Way Leg Reach with Resistance at Ankles and Unilateral Counter Support - 1 x daily - 7 x weekly - 1 sets - 10 reps

## 2021-02-27 DIAGNOSIS — J4540 Moderate persistent asthma, uncomplicated: Secondary | ICD-10-CM | POA: Diagnosis not present

## 2021-02-28 ENCOUNTER — Encounter: Payer: Self-pay | Admitting: Physical Therapy

## 2021-02-28 ENCOUNTER — Ambulatory Visit: Payer: No Typology Code available for payment source | Admitting: Physical Therapy

## 2021-02-28 ENCOUNTER — Other Ambulatory Visit: Payer: Self-pay

## 2021-02-28 DIAGNOSIS — M25561 Pain in right knee: Secondary | ICD-10-CM | POA: Diagnosis not present

## 2021-02-28 DIAGNOSIS — M6281 Muscle weakness (generalized): Secondary | ICD-10-CM

## 2021-02-28 DIAGNOSIS — M25661 Stiffness of right knee, not elsewhere classified: Secondary | ICD-10-CM

## 2021-02-28 DIAGNOSIS — G8929 Other chronic pain: Secondary | ICD-10-CM

## 2021-02-28 DIAGNOSIS — R262 Difficulty in walking, not elsewhere classified: Secondary | ICD-10-CM
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
## 2021-03-02 ENCOUNTER — Other Ambulatory Visit: Payer: Self-pay

## 2021-03-02 ENCOUNTER — Encounter: Payer: Self-pay | Admitting: Physical Therapy

## 2021-03-02 ENCOUNTER — Ambulatory Visit: Payer: No Typology Code available for payment source | Attending: Specialist | Admitting: Physical Therapy

## 2021-03-02 DIAGNOSIS — R262 Difficulty in walking, not elsewhere classified: Secondary | ICD-10-CM | POA: Diagnosis present

## 2021-03-02 DIAGNOSIS — G8929 Other chronic pain: Secondary | ICD-10-CM | POA: Diagnosis present

## 2021-03-02 DIAGNOSIS — M6281 Muscle weakness (generalized): Secondary | ICD-10-CM | POA: Insufficient documentation

## 2021-03-02 DIAGNOSIS — M25661 Stiffness of right knee, not elsewhere classified: Secondary | ICD-10-CM | POA: Diagnosis present

## 2021-03-02 DIAGNOSIS — M25562 Pain in left knee: Secondary | ICD-10-CM | POA: Diagnosis present
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
## 2021-03-10 DIAGNOSIS — C50911 Malignant neoplasm of unspecified site of right female breast: Secondary | ICD-10-CM | POA: Diagnosis not present

## 2021-03-30 ENCOUNTER — Ambulatory Visit: Payer: Self-pay | Admitting: Orthopedic Surgery

## 2021-03-30 DIAGNOSIS — J4540 Moderate persistent asthma, uncomplicated: Secondary | ICD-10-CM | POA: Diagnosis not present

## 2021-03-30 DIAGNOSIS — M1712 Unilateral primary osteoarthritis, left knee: Secondary | ICD-10-CM

## 2021-04-06 DIAGNOSIS — M961 Postlaminectomy syndrome, not elsewhere classified: Secondary | ICD-10-CM | POA: Diagnosis not present

## 2021-04-19 DIAGNOSIS — H2513 Age-related nuclear cataract, bilateral: Secondary | ICD-10-CM | POA: Diagnosis not present

## 2021-04-19 DIAGNOSIS — H524 Presbyopia: Secondary | ICD-10-CM | POA: Diagnosis not present

## 2021-04-19 DIAGNOSIS — H04123 Dry eye syndrome of bilateral lacrimal glands: Secondary | ICD-10-CM | POA: Diagnosis not present

## 2021-04-19 DIAGNOSIS — H35033 Hypertensive retinopathy, bilateral: Secondary | ICD-10-CM | POA: Diagnosis not present

## 2021-04-19 DIAGNOSIS — H25013 Cortical age-related cataract, bilateral: Secondary | ICD-10-CM | POA: Diagnosis not present

## 2021-04-19 DIAGNOSIS — H52203 Unspecified astigmatism, bilateral: Secondary | ICD-10-CM | POA: Diagnosis not present

## 2021-04-19 DIAGNOSIS — H5213 Myopia, bilateral: Secondary | ICD-10-CM | POA: Diagnosis not present

## 2021-04-19 DIAGNOSIS — R7303 Prediabetes: Secondary | ICD-10-CM | POA: Diagnosis not present

## 2021-04-20 ENCOUNTER — Ambulatory Visit: Payer: PPO | Admitting: Physical Therapy

## 2021-04-26 NOTE — Progress Notes (Addendum)
Anesthesia Review:  PCP: DR Duwaine Maxin  Cardiologist : DR Gweneth Fritter  in [place]  LOV 01/17/21 and DR Adora Fridge LOV 02/12/2019  Neuro- Lov 04/06/21  Chest x-ray : EKG :01/17/21 Requested tracing and on chart.  Echo : Stress test:2019  Cardiac Cath :  Activity level: can do a flgiht of stairs without difficulty  Sleep Study/ CPAP : none  Fasting Blood Sugar :      / Checks Blood Sugar -- times a day:   Blood Thinner/ Instructions /Last Dose: ASA / Instructions/ Last Dose :  81 mg aspirin   Covid test on 05/18/2021.  PT was 25 minutes late for preop appt.  Medical and surgical hx completed along with preop instructions.

## 2021-04-26 NOTE — Progress Notes (Signed)
Covid test on 05/17/2021.  Come to [facility] thru main entrance. Be seated in the lobby to the right as you come thru the door.  Call [phone].  Let them know your anme and you are here for covid testing .                 Your procedure is scheduled on:  05/20/2021.    Report to [facility] Main  Entrance   Report to admitting at   1030AM     Call this number if you have problems the morning of surgery [phone]    REMEMBER: NO  SOLID FOOD CANDY OR GUM AFTER MIDNIGHT. CLEAR LIQUIDS UNTIL   1015 am         . NOTHING BY MOUTH EXCEPT CLEAR LIQUIDS UNTIL  1015am   . PLEASE FINISH ENSURE DRINK PER SURGEON ORDER  WHICH NEEDS TO BE COMPLETED AT    1015am  .      CLEAR LIQUID DIET   Foods Allowed                                                                    Coffee and tea, regular and decaf                            Fruit ices (not with fruit pulp)                                      Iced Popsicles                                    Carbonated beverages, regular and diet                                    Cranberry, grape and apple juices Sports drinks like Gatorade Lightly seasoned clear broth or consume(fat free) Sugar, honey syrup ___________________________________________________________________      BRUSH YOUR TEETH MORNING OF SURGERY AND RINSE YOUR MOUTH OUT, NO CHEWING GUM CANDY OR MINTS.     Take these medicines the morning of surgery with A SIP OF WATER: nebulizedr if needed, inhaler as usual and bring, armimidex, cardizem, flonase, protonix zoloft   DO NOT TAKE ANY DIABETIC MEDICATIONS DAY OF YOUR SURGERY                               You may not have any metal on your body including hair pins and              piercings  Do not wear jewelry, make-up, lotions, powders or perfumes, deodorant             Do not wear nail polish on your fingernails.  Do not shave  48 hours prior to surgery.              Men may shave face and  neck.  Do not bring valuables to the hospital. [place].  Contacts, dentures or bridgework may not be worn into surgery.  Leave suitcase in the car. After surgery it may be brought to your room.     Patients discharged the day of surgery will not be allowed to drive home. IF YOU ARE HAVING SURGERY AND GOING HOME THE SAME DAY, YOU MUST HAVE AN ADULT TO DRIVE YOU HOME AND BE WITH YOU FOR 24 HOURS. YOU MAY GO HOME BY TAXI OR UBER OR ORTHERWISE, BUT AN ADULT MUST ACCOMPANY YOU HOME AND STAY WITH YOU FOR 24 HOURS.  Name and phone number of your driver:  Special Instructions: N/A              Please read over the following fact sheets you were given: _____________________________________________________________________  [facility] - Preparing for Surgery Before surgery, you can play an important role.  Because skin is not sterile, your skin needs to be as free of germs as possible.  You can reduce the number of germs on your skin by washing with CHG (chlorahexidine gluconate) soap before surgery.  CHG is an antiseptic cleaner which kills germs and bonds with the skin to continue killing germs even after washing. Please DO NOT use if you have an allergy to CHG or antibacterial soaps.  If your skin becomes reddened/irritated stop using the CHG and inform your nurse when you arrive at Short Stay. Do not shave (including legs and underarms) for at least 48 hours prior to the first CHG shower.  You may shave your face/neck. Please follow these instructions carefully:  1.  Shower with CHG Soap the night before surgery and the  morning of Surgery.  2.  If you choose to wash your hair, wash your hair first as usual with your  normal  shampoo.  3.  After you shampoo, rinse your hair and body thoroughly to remove the  shampoo.                           4.  Use CHG as you would any other liquid soap.  You can apply chg directly  to the skin and wash                        Gently with a scrungie or clean washcloth.  5.  Apply the CHG Soap to your body ONLY FROM THE NECK DOWN.   Do not use on face/ open                           Wound or open sores. Avoid contact with eyes, ears mouth and genitals (private parts).                       Wash face,  Genitals (private parts) with your normal soap.             6.  Wash thoroughly, paying special attention to the area where your surgery  will be performed.  7.  Thoroughly rinse your body with warm water from the neck down.  8.  DO NOT shower/wash with your normal soap after using and rinsing off  the CHG Soap.  9.  Pat yourself dry with a clean towel.            10.  Wear clean pajamas.            11.  Place clean sheets on your bed the night of your first shower and do not  sleep with pets. Day of Surgery : Do not apply any lotions/deodorants the morning of surgery.  Please wear clean clothes to the hospital/surgery center.  FAILURE TO FOLLOW THESE INSTRUCTIONS MAY RESULT IN THE CANCELLATION OF YOUR SURGERY PATIENT SIGNATURE_________________________________  NURSE SIGNATURE__________________________________  ________________________________________________________________________

## 2021-05-09 ENCOUNTER — Encounter (HOSPITAL_COMMUNITY)
Admission: RE | Admit: 2021-05-09 | Discharge: 2021-05-09 | Disposition: A | Discharge status: Home / Self Care | Payer: No Typology Code available for payment source | Source: Ambulatory Visit | Attending: Specialist | Admitting: Specialist

## 2021-05-09 ENCOUNTER — Other Ambulatory Visit: Payer: Self-pay

## 2021-05-09 ENCOUNTER — Encounter (HOSPITAL_COMMUNITY): Payer: Self-pay

## 2021-05-09 DIAGNOSIS — Z01812 Encounter for preprocedural laboratory examination: Secondary | ICD-10-CM | POA: Diagnosis present

## 2021-05-09 DIAGNOSIS — M1712 Unilateral primary osteoarthritis, left knee: Secondary | ICD-10-CM | POA: Insufficient documentation

## 2021-05-09 DIAGNOSIS — I10 Essential (primary) hypertension: Secondary | ICD-10-CM | POA: Diagnosis not present

## 2021-05-09 DIAGNOSIS — I471 Supraventricular tachycardia: Secondary | ICD-10-CM | POA: Diagnosis not present

## 2021-05-09 DIAGNOSIS — G4733 Obstructive sleep apnea (adult) (pediatric): Secondary | ICD-10-CM | POA: Diagnosis not present

## 2021-05-09 DIAGNOSIS — J45909 Unspecified asthma, uncomplicated: Secondary | ICD-10-CM | POA: Diagnosis not present

## 2021-05-09 DIAGNOSIS — M542 Cervicalgia: Secondary | ICD-10-CM | POA: Diagnosis not present

## 2021-05-09 DIAGNOSIS — Z01818 Encounter for other preprocedural examination: Secondary | ICD-10-CM

## 2021-05-09 DIAGNOSIS — K219 Gastro-esophageal reflux disease without esophagitis: Secondary | ICD-10-CM | POA: Diagnosis not present

## 2021-05-09 DIAGNOSIS — I739 Peripheral vascular disease, unspecified: Secondary | ICD-10-CM | POA: Diagnosis not present

## 2021-05-09 HISTORY — DX: Peripheral vascular disease, unspecified: I73.9

## 2021-05-09 LAB — URINALYSIS, ROUTINE W REFLEX MICROSCOPIC
Bilirubin Urine: NEGATIVE
Glucose, UA: NEGATIVE mg/dL
Hgb urine dipstick: NEGATIVE
Ketones, ur: NEGATIVE mg/dL
Leukocytes,Ua: NEGATIVE
Nitrite: NEGATIVE
Protein, ur: NEGATIVE mg/dL
Specific Gravity, Urine: 1.014 (ref 1.005–1.030)
pH: 6 (ref 5.0–8.0)

## 2021-05-09 LAB — BASIC METABOLIC PANEL
Anion gap: 8 (ref 5–15)
BUN: 16 mg/dL (ref 8–23)
CO2: 29 mmol/L (ref 22–32)
Calcium: 9.9 mg/dL (ref 8.9–10.3)
Chloride: 103 mmol/L (ref 98–111)
Creatinine, Ser: 0.77 mg/dL (ref 0.44–1.00)
GFR, Estimated: >60 mL/min (ref >60)
Glucose, Bld: 110 mg/dL — ABNORMAL HIGH (ref 70–99)
Potassium: 3.5 mmol/L (ref 3.5–5.1)
Sodium: 140 mmol/L (ref 135–145)

## 2021-05-09 LAB — HEMOGLOBIN A1C
Hgb A1c MFr Bld: 5.9 % — ABNORMAL HIGH (ref 4.8–5.6)
Mean Plasma Glucose: 122.63 mg/dL

## 2021-05-09 LAB — SURGICAL PCR SCREEN
MRSA, PCR: NEGATIVE
Staphylococcus aureus: NEGATIVE

## 2021-05-09 LAB — APTT: aPTT: 36 seconds (ref 24–36)

## 2021-05-09 LAB — CBC
HCT: 39.2 % (ref 36.0–46.0)
Hemoglobin: 12.7 g/dL (ref 12.0–15.0)
MCH: 27.5 pg (ref 26.0–34.0)
MCHC: 32.4 g/dL (ref 30.0–36.0)
MCV: 85 fL (ref 80.0–100.0)
Platelets: 269 10*3/uL (ref 150–400)
RBC: 4.61 MIL/uL (ref 3.87–5.11)
RDW: 14.8 % (ref 11.5–15.5)
WBC: 4.8 10*3/uL (ref 4.0–10.5)
nRBC: 0 % (ref 0.0–0.2)

## 2021-05-09 LAB — PROTIME-INR
INR: 1 (ref 0.8–1.2)
Prothrombin Time: 12.8 seconds (ref 11.4–15.2)

## 2021-05-10 NOTE — Anesthesia Preprocedure Evaluation (Addendum)
Anesthesia Evaluation  Patient identified by MRN, date of birth, ID band Patient awake    Reviewed: Allergy & Precautions, H&P , NPO status , Patient's Chart, lab work & pertinent test results  Airway Mallampati: II  TM Distance: >3 FB Neck ROM: Full    Dental no notable dental hx. (+) Teeth Intact, Dental Advisory Given   Pulmonary asthma , sleep apnea ,    Pulmonary exam normal breath sounds clear to auscultation       Cardiovascular Exercise Tolerance: Good hypertension, Pt. on medications + Peripheral Vascular Disease  + dysrhythmias  Rhythm:Regular Rate:Normal     Neuro/Psych Anxiety negative neurological ROS  negative psych ROS   GI/Hepatic Neg liver ROS, GERD  Medicated,  Endo/Other  negative endocrine ROS  Renal/GU negative Renal ROS  negative genitourinary   Musculoskeletal  (+) Arthritis , Osteoarthritis,    Abdominal   Peds  Hematology negative hematology ROS (+)   Anesthesia Other Findings   Reproductive/Obstetrics negative OB ROS                          Anesthesia Physical Anesthesia Plan  ASA: 2  Anesthesia Plan: Spinal   Post-op Pain Management: Regional block and Tylenol PO (pre-op)   Induction: Intravenous  PONV Risk Score and Plan: 3 and Midazolam, Propofol infusion, Dexamethasone and Ondansetron  Airway Management Planned: Natural Airway and Simple Face Mask  Additional Equipment:   Intra-op Plan:   Post-operative Plan:   Informed Consent: I have reviewed the patients History and Physical, chart, labs and discussed the procedure including the risks, benefits and alternatives for the proposed anesthesia with the patient or authorized representative who has indicated his/her understanding and acceptance.     Dental advisory given  Plan Discussed with: CRNA  Anesthesia Plan Comments: (See PAT note 05/09/2021, Konrad Felix Ward, PA-C)     Anesthesia  Quick Evaluation
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
## 2021-05-13 ENCOUNTER — Ambulatory Visit: Payer: Self-pay | Admitting: Orthopedic Surgery
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
## 2021-05-16 NOTE — Progress Notes (Signed)
PT called and had questions regarding what supplements to stop prior to surgery.  Instructed pt to call DR Beane office.  PT voiced understanding.

## 2021-05-18 ENCOUNTER — Other Ambulatory Visit: Payer: Self-pay

## 2021-05-18 ENCOUNTER — Encounter (HOSPITAL_COMMUNITY)
Admission: RE | Admit: 2021-05-18 | Discharge: 2021-05-18 | Disposition: A | Discharge status: Home / Self Care | Payer: No Typology Code available for payment source | Source: Ambulatory Visit | Attending: Specialist | Admitting: Specialist

## 2021-05-18 DIAGNOSIS — Z01812 Encounter for preprocedural laboratory examination: Secondary | ICD-10-CM | POA: Insufficient documentation

## 2021-05-18 DIAGNOSIS — Z20822 Contact with and (suspected) exposure to covid-19: Secondary | ICD-10-CM | POA: Insufficient documentation

## 2021-05-18 DIAGNOSIS — Z01818 Encounter for other preprocedural examination: Secondary | ICD-10-CM

## 2021-05-18 LAB — SARS CORONAVIRUS 2 (TAT 6-24 HRS): SARS Coronavirus 2: NEGATIVE

## 2021-05-20 ENCOUNTER — Observation Stay (HOSPITAL_COMMUNITY)
Admission: RE | Admit: 2021-05-20 | Discharge: 2021-05-23 | Disposition: A | Discharge status: Home / Self Care | Payer: No Typology Code available for payment source | Source: Ambulatory Visit | Attending: Specialist | Admitting: Specialist

## 2021-05-20 ENCOUNTER — Ambulatory Visit (HOSPITAL_COMMUNITY): Payer: No Typology Code available for payment source

## 2021-05-20 ENCOUNTER — Other Ambulatory Visit: Payer: Self-pay

## 2021-05-20 ENCOUNTER — Encounter (HOSPITAL_COMMUNITY): Payer: Self-pay | Admitting: Specialist

## 2021-05-20 ENCOUNTER — Encounter (HOSPITAL_COMMUNITY)
Admission: RE | Disposition: A | Discharge status: Home / Self Care | Payer: Self-pay | Source: Ambulatory Visit | Attending: Specialist

## 2021-05-20 ENCOUNTER — Ambulatory Visit (HOSPITAL_COMMUNITY): Payer: No Typology Code available for payment source | Admitting: Physician Assistant

## 2021-05-20 ENCOUNTER — Ambulatory Visit (HOSPITAL_COMMUNITY): Payer: No Typology Code available for payment source | Admitting: Anesthesiology

## 2021-05-20 DIAGNOSIS — I10 Essential (primary) hypertension: Secondary | ICD-10-CM | POA: Insufficient documentation

## 2021-05-20 DIAGNOSIS — Z7982 Long term (current) use of aspirin: Secondary | ICD-10-CM | POA: Diagnosis not present

## 2021-05-20 DIAGNOSIS — Z803 Family history of malignant neoplasm of breast: Secondary | ICD-10-CM | POA: Diagnosis not present

## 2021-05-20 DIAGNOSIS — Z79899 Other long term (current) drug therapy: Secondary | ICD-10-CM | POA: Diagnosis not present

## 2021-05-20 DIAGNOSIS — Z96659 Presence of unspecified artificial knee joint: Secondary | ICD-10-CM

## 2021-05-20 DIAGNOSIS — Z853 Personal history of malignant neoplasm of breast: Secondary | ICD-10-CM | POA: Diagnosis not present

## 2021-05-20 DIAGNOSIS — M1712 Unilateral primary osteoarthritis, left knee: Secondary | ICD-10-CM | POA: Diagnosis present

## 2021-05-20 DIAGNOSIS — J45909 Unspecified asthma, uncomplicated: Secondary | ICD-10-CM | POA: Insufficient documentation

## 2021-05-20 DIAGNOSIS — Z01818 Encounter for other preprocedural examination: Secondary | ICD-10-CM

## 2021-05-20 DIAGNOSIS — Z96652 Presence of left artificial knee joint: Secondary | ICD-10-CM

## 2021-05-20 HISTORY — PX: TOTAL KNEE ARTHROPLASTY: SHX125

## 2021-05-20 SURGERY — ARTHROPLASTY, KNEE, TOTAL
Anesthesia: Spinal | Site: Knee | Laterality: Left

## 2021-05-20 MED ORDER — BUPIVACAINE-EPINEPHRINE 0.25% -1:200000 IJ SOLN
INTRAMUSCULAR | Status: DC | PRN
  Administered 2021-05-20: 30 mL

## 2021-05-20 MED ORDER — MENTHOL 3 MG MT LOZG: 1.0000 | LOZENGE | OROMUCOSAL | Status: DC | PRN

## 2021-05-20 MED ORDER — ONDANSETRON HCL 4 MG/2ML IJ SOLN: 4.0000 mg | Freq: Four times a day (QID) | INTRAMUSCULAR | Status: DC | PRN

## 2021-05-20 MED ORDER — CEFAZOLIN SODIUM-DEXTROSE 2-4 GM/100ML-% IV SOLN
2.0000 g | INTRAVENOUS | Status: AC
  Administered 2021-05-20: 2 g via INTRAVENOUS
  Filled 2021-05-20: qty 100

## 2021-05-20 MED ORDER — HYDROMORPHONE HCL 1 MG/ML IJ SOLN: 0.2500 mg | INTRAMUSCULAR | Status: DC | PRN

## 2021-05-20 MED ORDER — TRANEXAMIC ACID-NACL 1000-0.7 MG/100ML-% IV SOLN
1000.0000 mg | INTRAVENOUS | Status: AC
  Administered 2021-05-20: 1000 mg via INTRAVENOUS

## 2021-05-20 MED ORDER — ACETAMINOPHEN 500 MG PO TABS: 1000.0000 mg | ORAL_TABLET | Freq: Once | ORAL | Status: DC

## 2021-05-20 MED ORDER — DOCUSATE SODIUM 100 MG PO CAPS
100.0000 mg | ORAL_CAPSULE | Freq: Two times a day (BID) | ORAL | Status: DC
  Administered 2021-05-20 – 2021-05-23 (×6): 100 mg via ORAL
  Filled 2021-05-20 (×6): qty 1

## 2021-05-20 MED ORDER — LIDOCAINE HCL (CARDIAC) PF 100 MG/5ML IV SOSY
PREFILLED_SYRINGE | INTRAVENOUS | Status: DC | PRN
  Administered 2021-05-20: 30 mg via INTRAVENOUS
  Administered 2021-05-20: 20 mg via INTRAVENOUS

## 2021-05-20 MED ORDER — BISACODYL 5 MG PO TBEC: 5.0000 mg | DELAYED_RELEASE_TABLET | Freq: Every day | ORAL | Status: DC | PRN

## 2021-05-20 MED ORDER — MIDAZOLAM HCL 5 MG/5ML IJ SOLN
INTRAMUSCULAR | Status: DC | PRN
  Administered 2021-05-20: 1 mg via INTRAVENOUS
  Administered 2021-05-20 (×2): .5 mg via INTRAVENOUS

## 2021-05-20 MED ORDER — PHENYLEPHRINE HCL (PRESSORS) 10 MG/ML IV SOLN
INTRAVENOUS | Status: AC
  Filled 2021-05-20: qty 1

## 2021-05-20 MED ORDER — STERILE WATER FOR IRRIGATION IR SOLN
Status: DC | PRN
  Administered 2021-05-20: 2000 mL

## 2021-05-20 MED ORDER — TIZANIDINE HCL 2 MG PO TABS: 2.0000 mg | ORAL_TABLET | Freq: Three times a day (TID) | ORAL | Status: DC | PRN

## 2021-05-20 MED ORDER — BUPIVACAINE IN DEXTROSE 0.75-8.25 % IT SOLN
INTRATHECAL | Status: DC | PRN
Start: 2021-05-20 — End: 2021-05-20
  Administered 2021-05-20: 2 mL via INTRATHECAL

## 2021-05-20 MED ORDER — SODIUM CHLORIDE 0.9 % IR SOLN
Status: DC | PRN
  Administered 2021-05-20: 1000 mL

## 2021-05-20 MED ORDER — ASPIRIN 81 MG PO CHEW
81.0000 mg | CHEWABLE_TABLET | Freq: Two times a day (BID) | ORAL | Status: DC
  Administered 2021-05-21 – 2021-05-23 (×5): 81 mg via ORAL
  Filled 2021-05-20 (×5): qty 1

## 2021-05-20 MED ORDER — CHLORHEXIDINE GLUCONATE 0.12 % MT SOLN
15.0000 mL | Freq: Once | OROMUCOSAL | Status: AC
  Administered 2021-05-20: 15 mL via OROMUCOSAL

## 2021-05-20 MED ORDER — HYDROCHLOROTHIAZIDE 25 MG PO TABS
25.0000 mg | ORAL_TABLET | Freq: Every day | ORAL | Status: DC
  Administered 2021-05-21 – 2021-05-23 (×3): 25 mg via ORAL
  Filled 2021-05-20 (×3): qty 1

## 2021-05-20 MED ORDER — RISAQUAD PO CAPS
1.0000 | ORAL_CAPSULE | Freq: Every day | ORAL | Status: DC
  Administered 2021-05-20 – 2021-05-23 (×4): 1 via ORAL
  Filled 2021-05-20 (×4): qty 1

## 2021-05-20 MED ORDER — PROPOFOL 10 MG/ML IV BOLUS
INTRAVENOUS | Status: DC | PRN
  Administered 2021-05-20: 10 mg via INTRAVENOUS

## 2021-05-20 MED ORDER — MIDAZOLAM HCL 2 MG/2ML IJ SOLN
1.0000 mg | INTRAMUSCULAR | Status: DC
  Administered 2021-05-20: 1 mg via INTRAVENOUS
  Filled 2021-05-20: qty 2

## 2021-05-20 MED ORDER — ALUM & MAG HYDROXIDE-SIMETH 200-200-20 MG/5ML PO SUSP: 30.0000 mL | ORAL | Status: DC | PRN

## 2021-05-20 MED ORDER — OXYCODONE HCL 5 MG PO TABS
5.0000 mg | ORAL_TABLET | ORAL | Status: DC | PRN
  Administered 2021-05-20: 5 mg via ORAL
  Administered 2021-05-21 – 2021-05-23 (×11): 10 mg via ORAL
  Filled 2021-05-20 (×7): qty 2
  Filled 2021-05-20: qty 1
  Filled 2021-05-20 (×5): qty 2

## 2021-05-20 MED ORDER — DIPHENHYDRAMINE HCL 25 MG PO CAPS: 25.0000 mg | ORAL_CAPSULE | Freq: Four times a day (QID) | ORAL | Status: DC | PRN

## 2021-05-20 MED ORDER — 0.9 % SODIUM CHLORIDE (POUR BTL) OPTIME
TOPICAL | Status: DC | PRN
  Administered 2021-05-20: 1000 mL

## 2021-05-20 MED ORDER — LACTATED RINGERS IV SOLN: INTRAVENOUS | Status: DC

## 2021-05-20 MED ORDER — BUPIVACAINE LIPOSOME 1.3 % IJ SUSP
INTRAMUSCULAR | Status: AC
  Filled 2021-05-20: qty 20

## 2021-05-20 MED ORDER — DEXAMETHASONE SODIUM PHOSPHATE 10 MG/ML IJ SOLN
INTRAMUSCULAR | Status: AC
  Filled 2021-05-20: qty 1

## 2021-05-20 MED ORDER — DILTIAZEM HCL ER COATED BEADS 180 MG PO CP24
180.0000 mg | ORAL_CAPSULE | Freq: Every day | ORAL | Status: DC
  Administered 2021-05-21 – 2021-05-23 (×3): 180 mg via ORAL
  Filled 2021-05-20 (×3): qty 1

## 2021-05-20 MED ORDER — SODIUM CHLORIDE (PF) 0.9 % IJ SOLN
INTRAMUSCULAR | Status: AC
  Filled 2021-05-20: qty 20

## 2021-05-20 MED ORDER — HYDROMORPHONE HCL 1 MG/ML IJ SOLN
0.5000 mg | INTRAMUSCULAR | Status: DC | PRN
  Administered 2021-05-20 – 2021-05-21 (×4): 1 mg via INTRAVENOUS
  Filled 2021-05-20 (×4): qty 1

## 2021-05-20 MED ORDER — TURMERIC 500 MG PO CAPS: 500.0000 mg | ORAL_CAPSULE | Freq: Every day | ORAL | Status: DC

## 2021-05-20 MED ORDER — METOCLOPRAMIDE HCL 5 MG PO TABS: 5.0000 mg | ORAL_TABLET | Freq: Three times a day (TID) | ORAL | Status: DC | PRN

## 2021-05-20 MED ORDER — FLUTICASONE PROPIONATE 50 MCG/ACT NA SUSP
1.0000 | Freq: Every day | NASAL | Status: DC | PRN
  Filled 2021-05-20: qty 16

## 2021-05-20 MED ORDER — POLYETHYLENE GLYCOL 3350 17 G PO PACK: 17.0000 g | PACK | Freq: Every day | ORAL | 0 refills | Status: DC

## 2021-05-20 MED ORDER — GABAPENTIN 400 MG PO CAPS
400.0000 mg | ORAL_CAPSULE | Freq: Four times a day (QID) | ORAL | Status: DC
  Administered 2021-05-20 – 2021-05-23 (×9): 400 mg via ORAL
  Filled 2021-05-20 (×9): qty 1

## 2021-05-20 MED ORDER — PHENOL 1.4 % MT LIQD: 1.0000 | OROMUCOSAL | Status: DC | PRN

## 2021-05-20 MED ORDER — ACETAMINOPHEN 325 MG PO TABS
325.0000 mg | ORAL_TABLET | Freq: Four times a day (QID) | ORAL | Status: DC | PRN
  Administered 2021-05-23: 650 mg via ORAL
  Filled 2021-05-20: qty 2

## 2021-05-20 MED ORDER — FENTANYL CITRATE (PF) 100 MCG/2ML IJ SOLN
INTRAMUSCULAR | Status: AC
  Filled 2021-05-20: qty 2

## 2021-05-20 MED ORDER — ALBUTEROL SULFATE HFA 108 (90 BASE) MCG/ACT IN AERS: 2.0000 | INHALATION_SPRAY | RESPIRATORY_TRACT | Status: DC | PRN

## 2021-05-20 MED ORDER — ONDANSETRON HCL 4 MG/2ML IJ SOLN
INTRAMUSCULAR | Status: AC
  Filled 2021-05-20: qty 2

## 2021-05-20 MED ORDER — MIDAZOLAM HCL 2 MG/2ML IJ SOLN
INTRAMUSCULAR | Status: AC
  Filled 2021-05-20: qty 2

## 2021-05-20 MED ORDER — TRANEXAMIC ACID-NACL 1000-0.7 MG/100ML-% IV SOLN
INTRAVENOUS | Status: AC
  Filled 2021-05-20: qty 100

## 2021-05-20 MED ORDER — MONTELUKAST SODIUM 10 MG PO TABS
10.0000 mg | ORAL_TABLET | Freq: Every day | ORAL | Status: DC
  Administered 2021-05-20 – 2021-05-22 (×2): 10 mg via ORAL
  Filled 2021-05-20 (×4): qty 1

## 2021-05-20 MED ORDER — ASPIRIN EC 81 MG PO TBEC: 81.0000 mg | DELAYED_RELEASE_TABLET | Freq: Two times a day (BID) | ORAL | 1 refills | Status: DC

## 2021-05-20 MED ORDER — POTASSIUM CHLORIDE IN NACL 20-0.45 MEQ/L-% IV SOLN
INTRAVENOUS | Status: DC
  Filled 2021-05-20: qty 1000

## 2021-05-20 MED ORDER — DIPHENHYDRAMINE HCL 12.5 MG/5ML PO ELIX: 12.5000 mg | ORAL_SOLUTION | ORAL | Status: DC | PRN

## 2021-05-20 MED ORDER — AZELASTINE HCL 0.1 % NA SOLN
2.0000 | Freq: Two times a day (BID) | NASAL | Status: DC
  Administered 2021-05-23: 2 via NASAL
  Filled 2021-05-20: qty 30

## 2021-05-20 MED ORDER — BUPIVACAINE-EPINEPHRINE (PF) 0.5% -1:200000 IJ SOLN
INTRAMUSCULAR | Status: DC | PRN
  Administered 2021-05-20: 20 mL via PERINEURAL

## 2021-05-20 MED ORDER — TIZANIDINE HCL 4 MG PO TABS
2.0000 mg | ORAL_TABLET | Freq: Every day | ORAL | Status: DC | PRN
  Administered 2021-05-21 – 2021-05-22 (×2): 2 mg via ORAL
  Filled 2021-05-20 (×3): qty 1

## 2021-05-20 MED ORDER — CRISABOROLE 2 % EX OINT: 1.0000 "application " | TOPICAL_OINTMENT | Freq: Two times a day (BID) | CUTANEOUS | Status: DC

## 2021-05-20 MED ORDER — POLYETHYLENE GLYCOL 3350 17 G PO PACK: 17.0000 g | PACK | Freq: Every day | ORAL | Status: DC | PRN

## 2021-05-20 MED ORDER — PHENYLEPHRINE HCL-NACL 20-0.9 MG/250ML-% IV SOLN
INTRAVENOUS | Status: DC | PRN
  Administered 2021-05-20: 40 ug/min via INTRAVENOUS

## 2021-05-20 MED ORDER — PROPOFOL 500 MG/50ML IV EMUL
INTRAVENOUS | Status: DC | PRN
  Administered 2021-05-20: 50 ug/kg/min via INTRAVENOUS

## 2021-05-20 MED ORDER — ONDANSETRON HCL 4 MG/2ML IJ SOLN
INTRAMUSCULAR | Status: DC | PRN
Start: 2021-05-20 — End: 2021-05-20
  Administered 2021-05-20: 4 mg via INTRAVENOUS

## 2021-05-20 MED ORDER — ADULT MULTIVITAMIN W/MINERALS CH
1.0000 | ORAL_TABLET | Freq: Every day | ORAL | Status: DC
  Administered 2021-05-20 – 2021-05-23 (×4): 1 via ORAL
  Filled 2021-05-20 (×4): qty 1

## 2021-05-20 MED ORDER — DEXAMETHASONE SODIUM PHOSPHATE 10 MG/ML IJ SOLN
INTRAMUSCULAR | Status: DC | PRN
  Administered 2021-05-20: 10 mg via INTRAVENOUS

## 2021-05-20 MED ORDER — PANTOPRAZOLE SODIUM 40 MG PO TBEC
40.0000 mg | DELAYED_RELEASE_TABLET | Freq: Every day | ORAL | Status: DC
  Administered 2021-05-21 – 2021-05-23 (×3): 40 mg via ORAL
  Filled 2021-05-20 (×3): qty 1

## 2021-05-20 MED ORDER — KCL IN DEXTROSE-NACL 20-5-0.45 MEQ/L-%-% IV SOLN
INTRAVENOUS | Status: AC
  Filled 2021-05-20 (×2): qty 1000

## 2021-05-20 MED ORDER — INSULIN ASPART 100 UNIT/ML IJ SOLN
0.0000 [IU] | Freq: Three times a day (TID) | INTRAMUSCULAR | Status: DC
  Administered 2021-05-21: 2 [IU] via SUBCUTANEOUS
  Administered 2021-05-21: 3 [IU] via SUBCUTANEOUS
  Administered 2021-05-21 – 2021-05-22 (×3): 2 [IU] via SUBCUTANEOUS

## 2021-05-20 MED ORDER — PROPOFOL 10 MG/ML IV BOLUS
INTRAVENOUS | Status: AC
  Filled 2021-05-20: qty 20

## 2021-05-20 MED ORDER — PROPOFOL 1000 MG/100ML IV EMUL
INTRAVENOUS | Status: AC
  Filled 2021-05-20: qty 100

## 2021-05-20 MED ORDER — ONDANSETRON HCL 4 MG PO TABS: 4.0000 mg | ORAL_TABLET | Freq: Four times a day (QID) | ORAL | Status: DC | PRN

## 2021-05-20 MED ORDER — METOCLOPRAMIDE HCL 5 MG/ML IJ SOLN: 5.0000 mg | Freq: Three times a day (TID) | INTRAMUSCULAR | Status: DC | PRN

## 2021-05-20 MED ORDER — CEFAZOLIN SODIUM-DEXTROSE 2-4 GM/100ML-% IV SOLN
2.0000 g | Freq: Four times a day (QID) | INTRAVENOUS | Status: AC
  Administered 2021-05-20 – 2021-05-21 (×2): 2 g via INTRAVENOUS
  Filled 2021-05-20 (×2): qty 100

## 2021-05-20 MED ORDER — FENTANYL CITRATE (PF) 100 MCG/2ML IJ SOLN
INTRAMUSCULAR | Status: DC | PRN
  Administered 2021-05-20: 50 ug via INTRAVENOUS

## 2021-05-20 MED ORDER — ANASTROZOLE 1 MG PO TABS
1.0000 mg | ORAL_TABLET | Freq: Every day | ORAL | Status: DC
  Administered 2021-05-21 – 2021-05-23 (×3): 1 mg via ORAL
  Filled 2021-05-20 (×4): qty 1

## 2021-05-20 MED ORDER — SERTRALINE HCL 25 MG PO TABS
25.0000 mg | ORAL_TABLET | Freq: Every day | ORAL | Status: DC
  Administered 2021-05-21 – 2021-05-23 (×3): 25 mg via ORAL
  Filled 2021-05-20 (×3): qty 1

## 2021-05-20 MED ORDER — BUPIVACAINE LIPOSOME 1.3 % IJ SUSP
INTRAMUSCULAR | Status: DC | PRN
  Administered 2021-05-20: 20 mL

## 2021-05-20 MED ORDER — FENTANYL CITRATE PF 50 MCG/ML IJ SOSY
50.0000 ug | PREFILLED_SYRINGE | INTRAMUSCULAR | Status: DC
  Administered 2021-05-20: 100 ug via INTRAVENOUS
  Filled 2021-05-20: qty 2

## 2021-05-20 MED ORDER — ACETAMINOPHEN 500 MG PO TABS
1000.0000 mg | ORAL_TABLET | Freq: Four times a day (QID) | ORAL | Status: AC
  Administered 2021-05-20 – 2021-05-21 (×4): 1000 mg via ORAL
  Filled 2021-05-20 (×4): qty 2

## 2021-05-20 MED ORDER — IRBESARTAN 75 MG PO TABS
75.0000 mg | ORAL_TABLET | Freq: Every day | ORAL | Status: DC
  Administered 2021-05-21 – 2021-05-23 (×3): 75 mg via ORAL
  Filled 2021-05-20 (×3): qty 1

## 2021-05-20 MED ORDER — ALBUTEROL SULFATE (2.5 MG/3ML) 0.083% IN NEBU: 2.5000 mg | INHALATION_SOLUTION | Freq: Four times a day (QID) | RESPIRATORY_TRACT | Status: DC | PRN

## 2021-05-20 MED ORDER — PHENYLEPHRINE HCL-NACL 20-0.9 MG/250ML-% IV SOLN
INTRAVENOUS | Status: AC
  Filled 2021-05-20: qty 250

## 2021-05-20 MED ORDER — IRRISEPT - 450ML BOTTLE WITH 0.05% CHG IN STERILE WATER, USP 99.95% OPTIME
TOPICAL | Status: DC | PRN
  Administered 2021-05-20: 450 mL

## 2021-05-20 MED ORDER — ACETAMINOPHEN 10 MG/ML IV SOLN
1000.0000 mg | INTRAVENOUS | Status: AC
  Administered 2021-05-20: 1000 mg via INTRAVENOUS
  Filled 2021-05-20: qty 100

## 2021-05-20 MED ORDER — OXYCODONE HCL 5 MG PO TABS: 5.0000 mg | ORAL_TABLET | ORAL | 0 refills | Status: DC | PRN

## 2021-05-20 MED ORDER — BUPIVACAINE-EPINEPHRINE (PF) 0.25% -1:200000 IJ SOLN
INTRAMUSCULAR | Status: AC
  Filled 2021-05-20: qty 30

## 2021-05-20 MED ORDER — LORATADINE 10 MG PO TABS
10.0000 mg | ORAL_TABLET | Freq: Every day | ORAL | Status: DC
  Administered 2021-05-20 – 2021-05-23 (×4): 10 mg via ORAL
  Filled 2021-05-20 (×4): qty 1

## 2021-05-20 MED ORDER — SODIUM CHLORIDE 0.9% FLUSH
INTRAVENOUS | Status: DC | PRN
  Administered 2021-05-20: 40 mL

## 2021-05-20 MED ORDER — LIDOCAINE HCL (PF) 2 % IJ SOLN
INTRAMUSCULAR | Status: AC
  Filled 2021-05-20: qty 5

## 2021-05-20 MED ORDER — OYSTER SHELL CALCIUM/D3 500-5 MG-MCG PO TABS
1.0000 | ORAL_TABLET | Freq: Every day | ORAL | Status: DC
  Administered 2021-05-21 – 2021-05-23 (×3): 1 via ORAL
  Filled 2021-05-20 (×3): qty 1

## 2021-05-20 MED ORDER — DOCUSATE SODIUM 100 MG PO CAPS: 100.0000 mg | ORAL_CAPSULE | Freq: Two times a day (BID) | ORAL | 1 refills | Status: AC | PRN

## 2021-05-20 MED ORDER — ORAL CARE MOUTH RINSE: 15.0000 mL | Freq: Once | OROMUCOSAL | Status: AC

## 2021-05-20 SURGICAL SUPPLY — 85 items
AGENT HMST SPONGE THK3/8 (HEMOSTASIS)
ATTUNE MED DOME PAT 38 KNEE (Knees) ×1 IMPLANT
ATTUNE MED DOME PAT 38MM KNEE (Knees) ×1 IMPLANT
ATTUNE PSFEM LTSZ6 NARCEM KNEE (Femur) ×2 IMPLANT
ATTUNE PSRP INSR SZ6 6 KNEE (Insert) ×1 IMPLANT
ATTUNE PSRP INSR SZ6 6MM KNEE (Insert) ×1 IMPLANT
BAG COUNTER SPONGE SURGICOUNT (BAG) IMPLANT
BAG DECANTER FOR FLEXI CONT (MISCELLANEOUS) ×3 IMPLANT
BAG SPEC THK2 15X12 ZIP CLS (MISCELLANEOUS)
BAG SPNG CNTER NS LX DISP (BAG)
BAG SURGICOUNT SPONGE COUNTING (BAG)
BAG ZIPLOCK 12X15 (MISCELLANEOUS) IMPLANT
BASE TIBIAL ROT PLAT SZ 5 KNEE (Knees) IMPLANT
BLADE SAW SGTL 11.0X1.19X90.0M (BLADE) ×3 IMPLANT
BLADE SAW SGTL 13.0X1.19X90.0M (BLADE) ×3 IMPLANT
BLADE SURG SZ10 CARB STEEL (BLADE) ×6 IMPLANT
BNDG COHESIVE 4X5 TAN ST LF (GAUZE/BANDAGES/DRESSINGS) ×3 IMPLANT
BNDG ELASTIC 4X5.8 VLCR STR LF (GAUZE/BANDAGES/DRESSINGS) ×3 IMPLANT
BNDG ELASTIC 6X5.8 VLCR STR LF (GAUZE/BANDAGES/DRESSINGS) ×3 IMPLANT
BOWL SMART MIX CTS (DISPOSABLE) ×3 IMPLANT
BSPLAT TIB 5 CMNT ROT PLAT STR (Knees) ×1 IMPLANT
CEMENT HV SMART SET (Cement) ×6 IMPLANT
CLOSURE WOUND 1/2 X4 (GAUZE/BANDAGES/DRESSINGS) ×2
COVER SURGICAL LIGHT HANDLE (MISCELLANEOUS) ×3 IMPLANT
CUFF TOURN SGL QUICK 34 (TOURNIQUET CUFF) ×3
CUFF TRNQT CYL 34X4.125X (TOURNIQUET CUFF) ×1 IMPLANT
DECANTER SPIKE VIAL GLASS SM (MISCELLANEOUS) ×3 IMPLANT
DRAPE INCISE IOBAN 66X45 STRL (DRAPES) ×3 IMPLANT
DRAPE ORTHO SPLIT 77X108 STRL (DRAPES) ×6
DRAPE SHEET LG 3/4 BI-LAMINATE (DRAPES) ×6 IMPLANT
DRAPE SURG ORHT 6 SPLT 77X108 (DRAPES) ×2 IMPLANT
DRAPE U-SHAPE 47X51 STRL (DRAPES) ×3 IMPLANT
DRSG AQUACEL AG ADV 3.5X10 (GAUZE/BANDAGES/DRESSINGS) ×3 IMPLANT
DRSG TEGADERM 4X4.75 (GAUZE/BANDAGES/DRESSINGS) IMPLANT
DURAPREP 26ML APPLICATOR (WOUND CARE) ×3 IMPLANT
ELECT BLADE TIP CTD 4 INCH (ELECTRODE) ×3 IMPLANT
ELECT REM PT RETURN 15FT ADLT (MISCELLANEOUS) ×3 IMPLANT
EVACUATOR 1/8 PVC DRAIN (DRAIN) IMPLANT
GAUZE SPONGE 2X2 8PLY STRL LF (GAUZE/BANDAGES/DRESSINGS) IMPLANT
GLOVE SRG 8 PF TXTR STRL LF DI (GLOVE) ×1 IMPLANT
GLOVE SURG POLYISO LF SZ7.5 (GLOVE) ×6 IMPLANT
GLOVE SURG POLYISO LF SZ8 (GLOVE) ×6 IMPLANT
GLOVE SURG UNDER POLY LF SZ7.5 (GLOVE) ×3 IMPLANT
GLOVE SURG UNDER POLY LF SZ8 (GLOVE) ×3
GOWN STRL REUS W/TWL XL LVL3 (GOWN DISPOSABLE) ×6 IMPLANT
HANDPIECE INTERPULSE COAX TIP (DISPOSABLE) ×3
HEMOSTAT SPONGE AVITENE ULTRA (HEMOSTASIS) IMPLANT
HOLDER FOLEY CATH W/STRAP (MISCELLANEOUS) IMPLANT
IMMOBILIZER KNEE 20 (SOFTGOODS) ×3
IMMOBILIZER KNEE 20 THIGH 36 (SOFTGOODS) ×1 IMPLANT
IMMOBILIZER KNEE 22 UNIV (SOFTGOODS) ×2 IMPLANT
JET LAVAGE IRRISEPT WOUND (IRRIGATION / IRRIGATOR) ×3
KIT TURNOVER KIT A (KITS) IMPLANT
LAVAGE JET IRRISEPT WOUND (IRRIGATION / IRRIGATOR) ×1 IMPLANT
MANIFOLD NEPTUNE II (INSTRUMENTS) ×3 IMPLANT
NDL SAFETY ECLIPSE 18X1.5 (NEEDLE) IMPLANT
NEEDLE HYPO 18GX1.5 SHARP (NEEDLE)
NS IRRIG 1000ML POUR BTL (IV SOLUTION) IMPLANT
PACK TOTAL KNEE CUSTOM (KITS) ×3 IMPLANT
PROTECTOR NERVE ULNAR (MISCELLANEOUS) ×3 IMPLANT
SAW OSC TIP CART 19.5X105X1.3 (SAW) ×3 IMPLANT
SEALER BIPOLAR AQUA 6.0 (INSTRUMENTS) ×3 IMPLANT
SET HNDPC FAN SPRY TIP SCT (DISPOSABLE) ×1 IMPLANT
SPONGE GAUZE 2X2 STER 10/PKG (GAUZE/BANDAGES/DRESSINGS)
SPONGE SURGIFOAM ABS GEL 100 (HEMOSTASIS) IMPLANT
SPONGE T-LAP 18X18 ~~LOC~~+RFID (SPONGE) ×9 IMPLANT
STAPLER VISISTAT (STAPLE) IMPLANT
STRIP CLOSURE SKIN 1/2X4 (GAUZE/BANDAGES/DRESSINGS) ×2 IMPLANT
SUT BONE WAX W31G (SUTURE) ×3 IMPLANT
SUT MNCRL AB 4-0 PS2 18 (SUTURE) IMPLANT
SUT STRATAFIX 0 PDS 27 VIOLET (SUTURE) ×3
SUT VIC AB 1 CT1 27 (SUTURE) ×6
SUT VIC AB 1 CT1 27XBRD ANTBC (SUTURE) ×2 IMPLANT
SUT VIC AB 1 CTX 36 (SUTURE)
SUT VIC AB 1 CTX36XBRD ANBCTR (SUTURE) IMPLANT
SUT VIC AB 2-0 CT1 27 (SUTURE) ×9
SUT VIC AB 2-0 CT1 TAPERPNT 27 (SUTURE) ×3 IMPLANT
SUTURE STRATFX 0 PDS 27 VIOLET (SUTURE) ×1 IMPLANT
SYR 3ML LL SCALE MARK (SYRINGE) IMPLANT
SYR 50ML LL SCALE MARK (SYRINGE) IMPLANT
TIBIAL BASE ROT PLAT SZ 5 KNEE (Knees) ×3 IMPLANT
TRAY FOLEY MTR SLVR 16FR STAT (SET/KITS/TRAYS/PACK) ×3 IMPLANT
WATER STERILE IRR 1000ML POUR (IV SOLUTION) ×3 IMPLANT
WIPE CHG CHLORHEXIDINE 2% (PERSONAL CARE ITEMS) ×3 IMPLANT
WRAP KNEE MAXI GEL POST OP (GAUZE/BANDAGES/DRESSINGS) ×3 IMPLANT

## 2021-05-20 NOTE — Anesthesia Procedure Notes (Signed)
Anesthesia Regional Block: Adductor canal block   Pre-Anesthetic Checklist: , timeout performed,  Correct Patient, Correct Site, Correct Laterality,  Correct Procedure, Correct Position, site marked,  Risks and benefits discussed,  Pre-op evaluation,  At surgeon's request and post-op pain management  Laterality: Left  Prep: Maximum Sterile Barrier Precautions used, chloraprep       Needles:  Injection technique: Single-shot  Needle Type: Echogenic Stimulator Needle     Needle Length: 9cm  Needle Gauge: 21     Additional Needles:   Procedures:,,,, ultrasound used (permanent image in chart),,    Narrative:  Start time: 05/20/2021 12:15 PM End time: 05/20/2021 12:25 PM Injection made incrementally with aspirations every 5 mL.  Performed by: Personally  Anesthesiologist: Roderic Palau, MD  Additional Notes:

## 2021-05-20 NOTE — Anesthesia Postprocedure Evaluation (Signed)
Anesthesia Post Note  Patient: Rachael Osborn  Procedure(s) Performed: TOTAL KNEE ARTHROPLASTY (Left: Knee)     Patient location during evaluation: PACU Anesthesia Type: Spinal and Regional Level of consciousness: oriented and awake and alert Pain management: pain level controlled Vital Signs Assessment: post-procedure vital signs reviewed and stable Respiratory status: spontaneous breathing and respiratory function stable Cardiovascular status: blood pressure returned to baseline and stable Postop Assessment: no headache, no backache, no apparent nausea or vomiting, spinal receding and patient able to bend at knees Anesthetic complications: no   No notable events documented.  Last Vitals:  Vitals:   05/20/21 1645 05/20/21 1700  BP: 126/85 122/74  Pulse: 72 73  Resp: 19 19  Temp:  36.6 C  SpO2: 95% 98%    Last Pain:  Vitals:   05/20/21 1700  TempSrc:   PainSc: 0-No pain                 Jaquil Todt,W. EDMOND

## 2021-05-20 NOTE — Interval H&P Note (Signed)
History and Physical Interval Note:  05/20/2021 12:54 PM  Rachael Osborn  has presented today for surgery, with the diagnosis of Left knee degenerative joint disease.  The various methods of treatment have been discussed with the patient and family. After consideration of risks, benefits and other options for treatment, the patient has consented to  Procedure(s): TOTAL KNEE ARTHROPLASTY (Left) as a surgical intervention.  The patient's history has been reviewed, patient examined, no change in status, stable for surgery.  I have reviewed the patient's chart and labs.  Questions were answered to the patient's satisfaction.     Johnn Hai

## 2021-05-20 NOTE — Brief Op Note (Signed)
05/20/2021  3:52 PM  PATIENT:  Rachael Osborn  66 y.o. female  PRE-OPERATIVE DIAGNOSIS:  Left knee degenerative joint disease  POST-OPERATIVE DIAGNOSIS:  Left knee degenerative joint disease  PROCEDURE:  Procedure(s): TOTAL KNEE ARTHROPLASTY (Left)  SURGEON:  Surgeon(s) and Role:    Susa Day, MD - Primary  PHYSICIAN ASSISTANT:   ASSISTANTS: [place]   ANESTHESIA:   spinal  EBL:  50 mL   BLOOD ADMINISTERED:none  DRAINS: none   LOCAL MEDICATIONS USED:  MARCAINE     SPECIMEN:  No Specimen  DISPOSITION OF SPECIMEN:  N/A  COUNTS:  YES  TOURNIQUET:   Total Tourniquet Time Documented: Thigh (Left) - 70 minutes Total: Thigh (Left) - 70 minutes   DICTATION: .Other Dictation: Dictation Number [id number]  PLAN OF CARE: Admit for overnight observation  PATIENT DISPOSITION:  PACU - hemodynamically stable.   Delay start of Pharmacological VTE agent (>24hrs) due to surgical blood loss or risk of bleeding: no

## 2021-05-20 NOTE — Discharge Instructions (Signed)
Elevate leg above heart 6x a day for 60minutes each Use knee immobilizer while walking until can SLR x 10 Use knee immobilizer in bed to keep knee in extension Aquacel dressing may remain in place until follow up. May shower with aquacel dressing in place. If the dressing becomes saturated or peels off, you may remove aquacel dressing. Do not remove steri-strips if they are present. Place new dressing with gauze and tape or ACE bandage which should be kept clean and dry and changed daily.   INSTRUCTIONS AFTER JOINT REPLACEMENT   Remove items at home which could result in a fall. This includes throw rugs or furniture in walking pathways ICE to the affected joint every three hours while awake for 30 minutes at a time, for at least the first 3-5 days, and then as needed for pain and swelling.  Continue to use ice for pain and swelling. You may notice swelling that will progress down to the foot and ankle.  This is normal after surgery.  Elevate your leg when you are not up walking on it.   Continue to use the breathing machine you got in the hospital (incentive spirometer) which will help keep your temperature down.  It is common for your temperature to cycle up and down following surgery, especially at night when you are not up moving around and exerting yourself.  The breathing machine keeps your lungs expanded and your temperature down.   DIET:  As you were doing prior to hospitalization, we recommend a well-balanced diet.  DRESSING / WOUND CARE / SHOWERING  Keep the surgical dressing until follow up.  The dressing is water proof, so you can shower without any extra covering.  IF THE DRESSING FALLS OFF or the wound gets wet inside, change the dressing with sterile gauze.  Please use good hand washing techniques before changing the dressing.  Do not use any lotions or creams on the incision until instructed by your surgeon.    ACTIVITY  Increase activity slowly as tolerated, but follow the weight  bearing instructions below.   No driving for 6 weeks or until further direction given by your physician.  You cannot drive while taking narcotics.  No lifting or carrying greater than 10 lbs. until further directed by your surgeon. Avoid periods of inactivity such as sitting longer than an hour when not asleep. This helps prevent blood clots.  You may return to work once you are authorized by your doctor.     WEIGHT BEARING   Weight bearing as tolerated with assist device (walker, cane, etc) as directed, use it as long as suggested by your surgeon or therapist, typically at least 4-6 weeks.   EXERCISES  Results after joint replacement surgery are often greatly improved when you follow the exercise, range of motion and muscle strengthening exercises prescribed by your doctor. Safety measures are also important to protect the joint from further injury. Any time any of these exercises cause you to have increased pain or swelling, decrease what you are doing until you are comfortable again and then slowly increase them. If you have problems or questions, call your caregiver or physical therapist for advice.   Rehabilitation is important following a joint replacement. After just a few days of immobilization, the muscles of the leg can become weakened and shrink (atrophy).  These exercises are designed to build up the tone and strength of the thigh and leg muscles and to improve motion. Often times heat used for twenty to thirty  minutes before working out will loosen up your tissues and help with improving the range of motion but do not use heat for the first two weeks following surgery (sometimes heat can increase post-operative swelling).   These exercises can be done on a training (exercise) mat, on the floor, on a table or on a bed. Use whatever works the best and is most comfortable for you.    Use music or television while you are exercising so that the exercises are a pleasant break in your day.  This will make your life better with the exercises acting as a break in your routine that you can look forward to.   Perform all exercises about fifteen times, three times per day or as directed.  You should exercise both the operative leg and the other leg as well.  Exercises include:   Quad Sets - Tighten up the muscle on the front of the thigh (Quad) and hold for 5-10 seconds.   Straight Leg Raises - With your knee straight (if you were given a brace, keep it on), lift the leg to 60 degrees, hold for 3 seconds, and slowly lower the leg.  Perform this exercise against resistance later as your leg gets stronger.  Leg Slides: Lying on your back, slowly slide your foot toward your buttocks, bending your knee up off the floor (only go as far as is comfortable). Then slowly slide your foot back down until your leg is flat on the floor again.  Angel Wings: Lying on your back spread your legs to the side as far apart as you can without causing discomfort.  Hamstring Strength:  Lying on your back, push your heel against the floor with your leg straight by tightening up the muscles of your buttocks.  Repeat, but this time bend your knee to a comfortable angle, and push your heel against the floor.  You may put a pillow under the heel to make it more comfortable if necessary.   A rehabilitation program following joint replacement surgery can speed recovery and prevent re-injury in the future due to weakened muscles. Contact your doctor or a physical therapist for more information on knee rehabilitation.    CONSTIPATION  Constipation is defined medically as fewer than three stools per week and severe constipation as less than one stool per week.  Even if you have a regular bowel pattern at home, your normal regimen is likely to be disrupted due to multiple reasons following surgery.  Combination of anesthesia, postoperative narcotics, change in appetite and fluid intake all can affect your bowels.   YOU MUST  use at least one of the following options; they are listed in order of increasing strength to get the job done.  They are all available over the counter, and you may need to use some, POSSIBLY even all of these options:    Drink plenty of fluids (prune juice may be helpful) and high fiber foods Colace 100 mg by mouth twice a day  Senokot for constipation as directed and as needed Dulcolax (bisacodyl), take with full glass of water  Miralax (polyethylene glycol) once or twice a day as needed.  If you have tried all these things and are unable to have a bowel movement in the first 3-4 days after surgery call either your surgeon or your primary doctor.    If you experience loose stools or diarrhea, hold the medications until you stool forms back up.  If your symptoms do not get better within  1 week or if they get worse, check with your doctor.  If you experience "the worst abdominal pain ever" or develop nausea or vomiting, please contact the office immediately for further recommendations for treatment.   ITCHING:  If you experience itching with your medications, try taking only a single pain pill, or even half a pain pill at a time.  You can also use Benadryl over the counter for itching or also to help with sleep.   TED HOSE STOCKINGS:  Use stockings on both legs until for at least 2 weeks or as directed by physician office. They may be removed at night for sleeping.  MEDICATIONS:  See your medication summary on the After Visit Summary that nursing will review with you.  You may have some home medications which will be placed on hold until you complete the course of blood thinner medication.  It is important for you to complete the blood thinner medication as prescribed.  PRECAUTIONS:  If you experience chest pain or shortness of breath - call 911 immediately for transfer to the hospital emergency department.   If you develop a fever greater that 101 F, purulent drainage from wound, increased  redness or drainage from wound, foul odor from the wound/dressing, or calf pain - CONTACT YOUR SURGEON.                                                   FOLLOW-UP APPOINTMENTS:  If you do not already have a post-op appointment, please call the office for an appointment to be seen by your surgeon.  Guidelines for how soon to be seen are listed in your After Visit Summary, but are typically between 1-4 weeks after surgery.  OTHER INSTRUCTIONS:   Knee Replacement:  Do not place pillow under knee, focus on keeping the knee straight while resting. CPM instructions: 0-90 degrees, 2 hours in the morning, 2 hours in the afternoon, and 2 hours in the evening. Place foam block, curve side up under heel at all times except when in CPM or when walking.  DO NOT modify, tear, cut, or change the foam block in any way.  POST-OPERATIVE OPIOID TAPER INSTRUCTIONS: It is important to wean off of your opioid medication as soon as possible. If you do not need pain medication after your surgery it is ok to stop day one. Opioids include: Codeine, Hydrocodone(Norco, Vicodin), Oxycodone(Percocet, oxycontin) and hydromorphone amongst others.  Long term and even short term use of opiods can cause: Increased pain response Dependence Constipation Depression Respiratory depression And more.  Withdrawal symptoms can include Flu like symptoms Nausea, vomiting And more Techniques to manage these symptoms Hydrate well Eat regular healthy meals Stay active Use relaxation techniques(deep breathing, meditating, yoga) Do Not substitute Alcohol to help with tapering If you have been on opioids for less than two weeks and do not have pain than it is ok to stop all together.  Plan to wean off of opioids This plan should start within one week post op of your joint replacement. Maintain the same interval or time between taking each dose and first decrease the dose.  Cut the total daily intake of opioids by one tablet each  day Next start to increase the time between doses. The last dose that should be eliminated is the evening dose.   MAKE SURE YOU:  Understand  these instructions.  Get help right away if you are not doing well or get worse.    Thank you for letting us be a part of your medical care team.  It is a privilege we respect greatly.  We hope these instructions will help you stay on track for a fast and full recovery!      

## 2021-05-20 NOTE — Progress Notes (Signed)
Assisted Dr. Edmond Fitzgerald with left, ultrasound guided, adductor canal block. Side rails up, monitors on throughout procedure. See vital signs in flow sheet. Tolerated Procedure well. 

## 2021-05-20 NOTE — Transfer of Care (Signed)
Immediate Anesthesia Transfer of Care Note  Patient: Rachael Osborn  Procedure(s) Performed: TOTAL KNEE ARTHROPLASTY (Left: Knee)  Patient Location: PACU  Anesthesia Type:Spinal  Level of Consciousness: awake, alert  and oriented  Airway & Oxygen Therapy: Patient Spontanous Breathing and Patient connected to face mask oxygen  Post-op Assessment: Report given to RN and Post -op Vital signs reviewed and stable  Post vital signs: Reviewed and stable  Last Vitals:  Vitals Value Taken Time  BP 108/76 05/20/21 1550  Temp    Pulse 87 05/20/21 1553  Resp 21 05/20/21 1553  SpO2 100 % 05/20/21 1553  Vitals shown include unvalidated device data.  Last Pain:  Vitals:   05/20/21 1300  TempSrc:   PainSc: 0-No pain      Patients Stated Pain Goal: 3 (94/12/90 4753)  Complications: No notable events documented.

## 2021-05-20 NOTE — Plan of Care (Signed)
  Problem: Clinical Measurements: Goal: Ability to maintain clinical measurements within normal limits will improve Outcome: Progressing   Problem: Activity: Goal: Risk for activity intolerance will decrease Outcome: Progressing   Problem: Pain Managment: Goal: General experience of comfort will improve Outcome: Progressing   Problem: Safety: Goal: Ability to remain free from injury will improve Outcome: Progressing   

## 2021-05-20 NOTE — Anesthesia Procedure Notes (Signed)
Spinal  Patient location during procedure: OR Start time: 05/20/2021 1:13 PM End time: 05/20/2021 1:18 PM Reason for block: surgical anesthesia Staffing Performed: anesthesiologist  Anesthesiologist: Roderic Palau, MD Preanesthetic Checklist Completed: patient identified, IV checked, risks and benefits discussed, surgical consent, monitors and equipment checked, pre-op evaluation and timeout performed Spinal Block Patient position: sitting Prep: DuraPrep Patient monitoring: cardiac monitor, continuous pulse ox and blood pressure Approach: midline Location: L3-4 Injection technique: single-shot Needle Needle type: Pencan  Needle gauge: 24 G Needle length: 9 cm Assessment Sensory level: T8 Events: CSF return Additional Notes Functioning IV was confirmed and monitors were applied. Sterile prep and drape, including hand hygiene and sterile gloves were used. The patient was positioned and the spine was prepped. The skin was anesthetized with lidocaine.  Free flow of clear CSF was obtained prior to injecting local anesthetic into the CSF.  The spinal needle aspirated freely following injection.  The needle was carefully withdrawn.  The patient tolerated the procedure well.

## 2021-05-21 DIAGNOSIS — M1712 Unilateral primary osteoarthritis, left knee: Secondary | ICD-10-CM | POA: Diagnosis not present

## 2021-05-21 LAB — BASIC METABOLIC PANEL
Anion gap: 9 (ref 5–15)
BUN: 12 mg/dL (ref 8–23)
CO2: 27 mmol/L (ref 22–32)
Calcium: 8.8 mg/dL — ABNORMAL LOW (ref 8.9–10.3)
Chloride: 101 mmol/L (ref 98–111)
Creatinine, Ser: 0.68 mg/dL (ref 0.44–1.00)
GFR, Estimated: >60 mL/min (ref >60)
Glucose, Bld: 177 mg/dL — ABNORMAL HIGH (ref 70–99)
Potassium: 3.3 mmol/L — ABNORMAL LOW (ref 3.5–5.1)
Sodium: 137 mmol/L (ref 135–145)

## 2021-05-21 LAB — CBC
HCT: 35.3 % — ABNORMAL LOW (ref 36.0–46.0)
Hemoglobin: 11.3 g/dL — ABNORMAL LOW (ref 12.0–15.0)
MCH: 27.4 pg (ref 26.0–34.0)
MCHC: 32 g/dL (ref 30.0–36.0)
MCV: 85.5 fL (ref 80.0–100.0)
Platelets: 230 10*3/uL (ref 150–400)
RBC: 4.13 MIL/uL (ref 3.87–5.11)
RDW: 14.7 % (ref 11.5–15.5)
WBC: 9.9 10*3/uL (ref 4.0–10.5)
nRBC: 0 % (ref 0.0–0.2)

## 2021-05-21 LAB — GLUCOSE, CAPILLARY
Glucose-Capillary: 146 mg/dL — ABNORMAL HIGH (ref 70–99)
Glucose-Capillary: 143 mg/dL — ABNORMAL HIGH (ref 70–99)
Glucose-Capillary: 153 mg/dL — ABNORMAL HIGH (ref 70–99)
Glucose-Capillary: 150 mg/dL — ABNORMAL HIGH (ref 70–99)

## 2021-05-21 MED ORDER — DIPHENHYDRAMINE HCL 25 MG PO CAPS: 25.0000 mg | ORAL_CAPSULE | Freq: Four times a day (QID) | ORAL | Status: DC | PRN

## 2021-05-21 NOTE — Progress Notes (Signed)
Rachael Osborn  MRN: [id number] DOB/Age: July 03, 1955 65 y.o. [facility] Procedure: Procedure(s) (LRB): TOTAL KNEE ARTHROPLASTY (Left)     Subjective: Post op pain as expected Some itching associated with meds  Vital Signs Temp:  [97.7 F (36.5 C)-98.2 F (36.8 C)] 98 F (36.7 C) (01/21 0814) Pulse Rate:  [66-89] 80 (01/21 0814) Resp:  [12-20] 15 (01/21 0814) BP: (94-141)/(61-89) 109/73 (01/21 0814) SpO2:  [87 %-100 %] 87 % (01/21 0814) Weight:  [81.6 kg] 81.6 kg (01/20 1133)  Lab Results Recent Labs    05/21/21 0303  WBC 9.9  HGB 11.3*  HCT 35.3*  PLT 230   BMET Recent Labs    05/21/21 0303  NA 137  K 3.3*  CL 101  CO2 27  GLUCOSE 177*  BUN 12  CREATININE 0.68  CALCIUM 8.8*   INR  Date Value Ref Range Status  05/09/2021 1.0 0.8 - 1.2 Final    Comment:    (NOTE) INR goal varies based on device and disease states. Performed at [facility], [place] [address] [place], [place] [address]      Exam NVI  Getting ready to work with PT        Plan Benadryl ordered Mobilize today with anticipated DC to home tomorrow  Jenetta Loges PA-C  05/21/2021, 9:41 AM Contact # [phone]

## 2021-05-21 NOTE — Progress Notes (Signed)
°   05/21/21 1500  PT Visit Information  Last PT Received On 05/21/21  Assistance Needed Pt progressing. Exercise focused session as pt just back to bed with nursing. Having some pain however willing to work within limits.   History of Present Illness 66 yo female s/p L TKA. PMH: SVT, ablation  Subjective Data  Patient Stated Goal home  Precautions  Precautions Knee  Required Braces or Orthoses Knee Immobilizer - Left  Restrictions  Weight Bearing Restrictions No  Other Position/Activity Restrictions WBAT  Pain Assessment  Pain Assessment Faces  Faces Pain Scale 6  Pain Location L knee  Pain Descriptors / Indicators Aching;Grimacing;Sore  Pain Intervention(s) Limited activity within patient's tolerance;Monitored during session;Premedicated before session;Ice applied  Cognition  Arousal/Alertness Awake/alert  Behavior During Therapy WFL for tasks assessed/performed  Overall Cognitive Status Within Functional Limits for tasks assessed  Bed Mobility  General bed mobility comments  (just back to bed on PT arrival)  Total Joint Exercises  Ankle Circles/Pumps AROM;Both;10 reps  Quad Sets AROM;Both;10 reps  Heel Slides AAROM;Left;10 reps  Hip ABduction/ADduction AAROM;AROM;Left;10 reps  Straight Leg Raises AAROM;Strengthening;Left;10 reps  Goniometric ROM grossly 8 to 60 degrees flexion L knee  PT - End of Session  Equipment Utilized During Treatment Gait belt  Activity Tolerance Patient tolerated treatment well  Patient left in bed;with call bell/phone within reach;with bed alarm set  Nurse Communication Mobility status   PT - Assessment/Plan  PT Plan Current plan remains appropriate  PT Visit Diagnosis Other abnormalities of gait and mobility (R26.89);Difficulty in walking, not elsewhere classified (R26.2)  PT Frequency (ACUTE ONLY) 7X/week  Follow Up Recommendations Follow physician's recommendations for discharge plan and follow up therapies  Assistance recommended at discharge  Intermittent Supervision/Assistance  Patient can return home with the following Help with stairs or ramp for entrance;Assist for transportation  PT equipment [id number]  AM-PAC PT "6 Clicks" Mobility Outcome Measure (Version 2)  Help needed turning from your back to your side while in a flat bed without using bedrails? 3  Help needed moving from lying on your back to sitting on the side of a flat bed without using bedrails? 3  Help needed moving to and from a bed to a chair (including a wheelchair)? 3  Help needed standing up from a chair using your arms (e.g., wheelchair or bedside chair)? 3  Help needed to walk in hospital room? 3  Help needed climbing 3-5 steps with a railing?  2  6 Click Score 17  Consider Recommendation of Discharge To: Home with [facility]  PT Goal Progression  Progress towards PT goals Progressing toward goals  Acute Rehab PT Goals  PT Goal Formulation With patient  Time For Goal Achievement 05/28/21  Potential to Achieve Goals Good  PT Time Calculation  PT Start Time (ACUTE ONLY) 1524  PT Stop Time (ACUTE ONLY) 1548  PT Time Calculation (min) (ACUTE ONLY) 24 min  PT General Charges  $$ ACUTE PT VISIT 1 Visit  PT Treatments  $Therapeutic Exercise 23-37 mins

## 2021-05-21 NOTE — Op Note (Signed)
Rachael Osborn, [facility] MEDICAL RECORD NO: [id number] ACCOUNT NO: [id number] DATE OF BIRTH: 03-Mar-1956 FACILITY: [facility] LOCATION: [facility] PHYSICIAN: Johnn Hai, MD  Operative Report   DATE OF PROCEDURE: 05/20/2021  PREOPERATIVE DIAGNOSIS:  End-stage osteoarthrosis of the left knee, medial compartment, patellofemoral joint.  POSTOPERATIVE DIAGNOSIS:  End-stage osteoarthrosis of the left knee, medial compartment, patellofemoral joint.  PROCEDURE PERFORMED:  Left total knee arthroplasty utilizing DePuy Attune rotating platform 6 femur, 6 tibia, 6 mm insert, 38 patella.  ANESTHESIA:  Spinal  ASSISTANT: Cleophas Dunker, PA.  HISTORY:  A 66 year old with end-stage osteoarthrosis, medial compartment, patellofemoral joint, left knee, indicated for replacement of the degenerated joint. Risks and benefits discussed including bleeding, infection, damage to neurovascular  structures, no change in symptoms, worsening symptoms, DVT, PE, and anesthetic complications, etc.  TECHNIQUE:  The patient in supine position.  After induction of adequate spinal anesthesia, 2 grams of Kefzol, left lower extremity was prepped and draped and exsanguinated in the usual sterile fashion.  Thigh tourniquet inflated to 225 mmHg.  Midline  incision was then made over the knee.  Full thickness flaps developed. Median parapatellar arthrotomy was performed.  Soft tissue elevated medially, preserving the MCL.  Patella was gently everted. Knee was flexed.  Tricompartmental osteoarthrosis was  noted, bone-on-bone medial compartment and the lateral facet of the patella.  Removed the remnants of the medial and lateral menisci.  A notch was made above the femoral notch for entry hole to the femoral canal.  This was drilled, irrigated, T-handle  utilized and 5-degree left with 9 off the distal femur was then placed, pinned, and our distal femoral cut was then performed, protecting soft tissues at all times.  I then sized the femur  off the anterior cortex to a 6.  This was pinned in 3 degrees of  external rotation. I placed our distal femoral cutting block and I performed anterior, posterior chamfer cuts.  Soft tissues protected posteriorly at all times.  Following that, I subluxed the tibia, the low side was medial.  She had a fairly significant  slope, 2 off the defect was posteromedially.  External alignment guide parallel to the shaft bisecting the tibiotalar joint, 3-degree slope.  This was then pinned and with soft tissue protected at all times, I performed the proximal tibial cut.  This  was performed without difficulty.  I then tried my extension block with a 6, at 5 with full extension, slight hyperextension and it had an equivalent of flexion gap.  Following this, we subluxed the tibia, sized it to a 6 maximizing the coverage just  medial aspect of the tibial tubercle.  This was then pinned, I harvested bone centrally and impacted into the distal femur.  I drilled centrally, used our punch guide.  I then turned attention to the femur.  Used our box cut jig, bisecting the canal.   This was pinned. I performed a box cut without difficulty.  I then placed a trial femur and drilled our lug holes.  This fit flush.  I then placed a 6 insert and reduced the knee and had full extension, full flexion, good stability to varus valgus  stressing at 0 and 30 degrees, negative anterior drawer.  We then everted the patella, prepared for the patella.  The patella was worn significantly laterally down to some eburnated bone.  This was measured  22 on the thickest side. The thinnest side was  a 13.  We then used the external alignment.  We used  the patellar jig referencing off the mid portion of the sulcus of the patella.  This measured to a 14.  I used the additional spacer and then I performed our patellar cut just getting the portion of  the lateral facet, which was worn and eburnated.  After measuring this, it was 15 at its greatest  dimension and then a 12 medially at its thinnest.  This was then measured as 35.  I then selected a 38 to have maximum coverage of the patella for  additional thickness laterally.  I drilled our peg holes after utilizing the paddle template parallel to the joint surface.  Drilled our holes and placed our 38 patella, reduced it and had excellent patellofemoral tracking.  Then, all instrumentation was  removed.  I used pulsatile lavage after checking posteriorly.  Remnants of the menisci were removed.  Capsule was intact as well as the popliteus.  We cauterized the geniculates.  We then flexed the knee, subluxed the tibia, thoroughly dried all  surfaces, mixed cement on the back table under vacuum.  This was then injected into the tibial canal digitally pressurized it.  I then impacted a 6 tibial tray after cement was placed on the tray as well.  Redundant cement was removed.  I cemented and  impacted the femoral component with cement on the femoral component and the femur.  Redundant cement removed.  It fit flush.  A 6 trial was placed and reduced and an axial load was placed throughout the curing of the cement with the knee in 0 degrees of  extension.   cemented and clamped the patella.  Marcaine with epinephrine was placed in the joint as was Prontosan.  After appropriate curing of the cement, tourniquet was deflated 70 minutes.  There was minimal bleeding.  It was noted following this,  which was cauterized.  After flexion and extension, the patient had good stability to varus valgus stressing at 0 and 30 degrees, negative anterior drawer.  I selected a 6, removed the trial and we meticulously removed all redundant cement.  I then  copiously irrigated the joint with pulsatile lavage, then irrigant.  Flexed the knee, placed a 6 permanent insert, reduced the knee.  We had full extension, full flexion, good stability varus and valgus stressing at 0 and 30 degrees, negative anterior  drawer.  Copious  irrigation once again. The knee was placed in slight flexion reapproximated patellar arthrotomy with 1 Vicryl in interrupted figure-of-eight sutures oversewn with a running Stratafix.  Subcutaneous was irrigated and 2-0 was utilized and  subcuticular Monocryl.  The patient had flexion to gravity at 90 degrees.  The patient was then placed in an immobilizer and transported to the recovery room in satisfactory condition.  The patient tolerated the procedure well.  No complications.    ASSISTANT:  Cleophas Dunker, PA.  BLOOD LOSS:  50 mL     PAA D: 05/20/2021 4:02:03 pm T: 05/21/2021 12:55:00 am  JOB: [id number]/ [id number]

## 2021-05-21 NOTE — Evaluation (Signed)
Physical Therapy Evaluation Patient Details Name: Rachael Osborn MRN: [id number] DOB: Aug 16, 1955 Today's Date: 05/21/2021  History of Present Illness  66 yo female s/p L TKA. PMH: SVT, ablation  Clinical Impression  Pt is s/p TKA resulting in the deficits listed below (see PT Problem List).  Pt amb ` 30' with RW and min assist. Anticipate steady progress in acute setting.   Pt will benefit from skilled PT to increase their independence and safety with mobility to allow discharge to the venue listed below.         Recommendations for follow up therapy are one component of a multi-disciplinary discharge planning process, led by the attending physician.  Recommendations may be updated based on patient status, additional functional criteria and insurance authorization.  Follow Up Recommendations Follow physician's recommendations for discharge plan and follow up therapies    Assistance Recommended at Discharge Intermittent Supervision/Assistance  Patient can return home with the following  Help with stairs or ramp for entrance;Assist for transportation    Equipment Recommendations BSC/3in1  Recommendations for Other Services       Functional Status Assessment Patient has had a recent decline in their functional status and demonstrates the ability to make significant improvements in function in a reasonable and predictable amount of time.     Precautions / Restrictions Precautions Precautions: Knee Required Braces or Orthoses: Knee Immobilizer - Left Restrictions Weight Bearing Restrictions: No Other Position/Activity Restrictions: WBAT      Mobility  Bed Mobility Overal bed mobility: Needs Assistance Bed Mobility: Supine to Sit     Supine to sit: Min assist     General bed mobility comments: assist with LLE    Transfers Overall transfer level: Needs assistance Equipment used: Rolling walker (2 wheels) Transfers: Sit to/from Stand Sit to Stand: Min assist            General transfer comment: assist with transition to stand. cues for hand placement    Ambulation/Gait Ambulation/Gait assistance: Min assist, Min guard Gait Distance (Feet): 30 Feet Assistive device: Rolling walker (2 wheels) Gait Pattern/deviations: Step-to pattern, Antalgic       General Gait Details: cues for sequence and RW position  Stairs            Wheelchair Mobility    Modified Rankin (Stroke Patients Only)       Balance Overall balance assessment: Needs assistance                                           Pertinent Vitals/Pain Pain Assessment Pain Assessment: Faces Faces Pain Scale: Hurts little more Pain Location: L knee Pain Descriptors / Indicators: Aching, Grimacing, Sore Pain Intervention(s): Limited activity within patient's tolerance, Monitored during session, Premedicated before session, Repositioned    Home Living Family/patient expects to be discharged to:: Private residence Living Arrangements: Spouse/significant other Available Help at Discharge: Family Type of Home: House Home Access: Stairs to enter   Technical brewer of Steps: 1 Alternate Level Stairs-Number of Steps: flight Home Layout: Two level;Bed/bath upstairs Home Equipment: Conservation officer, nature (2 wheels)      Prior Function Prior Level of Function : Independent/Modified Independent                     Hand Dominance        Extremity/Trunk Assessment   Upper Extremity Assessment Upper Extremity Assessment: Overall WFL for  tasks assessed    Lower Extremity Assessment Lower Extremity Assessment: LLE deficits/detail LLE Deficits / Details: ankle WFL, knee extension and hip flexion 2+/5       Communication   Communication: No difficulties  Cognition Arousal/Alertness: Awake/alert Behavior During Therapy: WFL for tasks assessed/performed Overall Cognitive Status: Within Functional Limits for tasks assessed                                           General Comments      Exercises     Assessment/Plan    PT Assessment Patient needs continued PT services  PT Problem List Decreased strength;Decreased range of motion;Decreased activity tolerance;Decreased mobility;Decreased balance;Pain;Decreased knowledge of use of DME       PT Treatment Interventions DME instruction;Therapeutic activities;Gait training;Therapeutic exercise;Patient/family education;Functional mobility training;Stair training    PT Goals (Current goals can be found in the Care Plan section)  Acute Rehab PT Goals Patient Stated Goal: home PT Goal Formulation: With patient Time For Goal Achievement: 05/28/21 Potential to Achieve Goals: Good    Frequency 7X/week     Co-evaluation               AM-PAC PT "6 Clicks" Mobility  Outcome Measure Help needed turning from your back to your side while in a flat bed without using bedrails?: A Little Help needed moving from lying on your back to sitting on the side of a flat bed without using bedrails?: A Little Help needed moving to and from a bed to a chair (including a wheelchair)?: A Little Help needed standing up from a chair using your arms (e.g., wheelchair or bedside chair)?: A Little Help needed to walk in hospital room?: A Little Help needed climbing 3-5 steps with a railing? : A Lot 6 Click Score: 17    End of Session Equipment Utilized During Treatment: Gait belt Activity Tolerance: Patient tolerated treatment well Patient left: in chair;with call bell/phone within reach;with nursing/sitter in room;with chair alarm set   PT Visit Diagnosis: Other abnormalities of gait and mobility (R26.89);Difficulty in walking, not elsewhere classified (R26.2)    Time: 0920-0949 PT Time Calculation (min) (ACUTE ONLY): 29 min   Charges:   PT Evaluation $PT Eval Low Complexity: 1 Low PT Treatments $Gait Training: 8-22 mins        Baxter Flattery, PT  Acute Rehab Dept ([place])  [phone] Pager [phone]  05/21/2021   [facility] 05/21/2021, 1:03 PM

## 2021-05-22 DIAGNOSIS — M1712 Unilateral primary osteoarthritis, left knee: Secondary | ICD-10-CM | POA: Diagnosis not present

## 2021-05-22 LAB — GLUCOSE, CAPILLARY
Glucose-Capillary: 126 mg/dL — ABNORMAL HIGH (ref 70–99)
Glucose-Capillary: 141 mg/dL — ABNORMAL HIGH (ref 70–99)
Glucose-Capillary: 114 mg/dL — ABNORMAL HIGH (ref 70–99)

## 2021-05-22 LAB — CBC
HCT: 31.8 % — ABNORMAL LOW (ref 36.0–46.0)
Hemoglobin: 10.3 g/dL — ABNORMAL LOW (ref 12.0–15.0)
MCH: 27.6 pg (ref 26.0–34.0)
MCHC: 32.4 g/dL (ref 30.0–36.0)
MCV: 85.3 fL (ref 80.0–100.0)
Platelets: 214 10*3/uL (ref 150–400)
RBC: 3.73 MIL/uL — ABNORMAL LOW (ref 3.87–5.11)
RDW: 14.9 % (ref 11.5–15.5)
WBC: 7.8 10*3/uL (ref 4.0–10.5)
nRBC: 0 % (ref 0.0–0.2)

## 2021-05-22 NOTE — Progress Notes (Signed)
Subjective: 2 Days Post-Op Procedure(s) (LRB): TOTAL KNEE ARTHROPLASTY (Left) Patient reports pain as 3 on 0-10 scale.   Denies CP or SOB.  Voiding without difficulty. Positive flatus. Objective: Vital signs in last 24 hours: Temp:  [98 F (36.7 C)-98.4 F (36.9 C)] 98.4 F (36.9 C) (01/22 0517) Pulse Rate:  [77-93] 93 (01/22 0517) Resp:  [18] 18 (01/22 0517) BP: (112-129)/(68-80) 122/68 (01/22 0517) SpO2:  [97 %-100 %] 100 % (01/22 0517)  Intake/Output from previous day: 01/21 0701 - 01/22 0700 In: 1580.8 [P.O.:1243; I.V.:337.8] Out: 1900 [Urine:1900] Intake/Output this shift: No intake/output data recorded.  Recent Labs    05/21/21 0303 05/22/21 0257  HGB 11.3* 10.3*   Recent Labs    05/21/21 0303 05/22/21 0257  WBC 9.9 7.8  RBC 4.13 3.73*  HCT 35.3* 31.8*  PLT 230 214   Recent Labs    05/21/21 0303  NA 137  K 3.3*  CL 101  CO2 27  BUN 12  CREATININE 0.68  GLUCOSE 177*  CALCIUM 8.8*   No results for input(s): LABPT, INR in the last 72 hours.  Neurologically intact Neurovascular intact Sensation intact distally Intact pulses distally Dorsiflexion/Plantar flexion intact Incision: dressing C/D/I  Assessment/Plan:  2 Days Post-Op Procedure(s) (LRB): TOTAL KNEE ARTHROPLASTY (Left) Advance diet Up with therapy Discharge home with home health Instructions given   Principal Problem:   S/P TKR (total knee replacement) using cement      Rachael Osborn 05/22/2021, @[facility] 

## 2021-05-22 NOTE — Progress Notes (Signed)
Physical Therapy Treatment Patient Details Name: Rachael Osborn MRN: [id number] DOB: [birth year] Today's Date: 05/22/2021   History of Present Illness 66 yo female s/p L TKA. PMH: SVT, ablation    PT Comments    Pt progressing, slow but steady gait. Will try stairs when pt husband is here  later today   Recommendations for follow up therapy are one component of a multi-disciplinary discharge planning process, led by the attending physician.  Recommendations may be updated based on patient status, additional functional criteria and insurance authorization.  Follow Up Recommendations  Follow physician's recommendations for discharge plan and follow up therapies     Assistance Recommended at Discharge Intermittent Supervision/Assistance  Patient can return home with the following Help with stairs or ramp for entrance;Assist for transportation   Equipment Recommendations  BSC/3in1    Recommendations for Other Services       Precautions / Restrictions Precautions Precautions: Knee Required Braces or Orthoses: Knee Immobilizer - Left Restrictions Weight Bearing Restrictions: No Other Position/Activity Restrictions: WBAT     Mobility  Bed Mobility Overal bed mobility: Needs Assistance Bed Mobility: Supine to Sit, Sit to Supine     Supine to sit: Min assist Sit to supine: Min assist   General bed mobility comments: assist with LLE (just back to bed on PT arrival)    Transfers Overall transfer level: Needs assistance Equipment used: Rolling walker (2 wheels) Transfers: Sit to/from Stand Sit to Stand: Min guard           General transfer comment: cues for hand placement and LLE position    Ambulation/Gait Ambulation/Gait assistance: Min guard, Supervision Gait Distance (Feet): 35 Feet Assistive device: Rolling walker (2 wheels) Gait Pattern/deviations: Step-to pattern, Antalgic       General Gait Details: cues for sequence and RW position, extremely slow but  steady gait   Stairs             Wheelchair Mobility    Modified Rankin (Stroke Patients Only)       Balance                                            Cognition Arousal/Alertness: Awake/alert Behavior During Therapy: WFL for tasks assessed/performed Overall Cognitive Status: Within Functional Limits for tasks assessed                                          Exercises Total Joint Exercises Ankle Circles/Pumps: AROM, Both, 10 reps    General Comments        Pertinent Vitals/Pain Pain Assessment Pain Assessment: 0-10 Faces Pain Scale: Hurts even more Pain Location: L knee Pain Descriptors / Indicators: Aching, Grimacing, Sore Pain Intervention(s): Limited activity within patient's tolerance, Monitored during session, Premedicated before session, Repositioned, Ice applied, Other (comment) (elevation)    Home Living                          Prior Function            PT Goals (current goals can now be found in the care plan section) Acute Rehab PT Goals Patient Stated Goal: home PT Goal Formulation: With patient Time For Goal Achievement: 05/28/21 Potential to Achieve Goals: Good Progress towards PT goals:  Progressing toward goals    Frequency    7X/week      PT Plan Current plan remains appropriate    Co-evaluation              AM-PAC PT "6 Clicks" Mobility   Outcome Measure  Help needed turning from your back to your side while in a flat bed without using bedrails?: A Little Help needed moving from lying on your back to sitting on the side of a flat bed without using bedrails?: A Little Help needed moving to and from a bed to a chair (including a wheelchair)?: A Little Help needed standing up from a chair using your arms (e.g., wheelchair or bedside chair)?: A Little Help needed to walk in hospital room?: A Little Help needed climbing 3-5 steps with a railing? : A Lot 6 Click Score:  17    End of Session Equipment Utilized During Treatment: Gait belt;Left knee immobilizer Activity Tolerance: Patient tolerated treatment well Patient left: in bed;with call bell/phone within reach;with bed alarm set Nurse Communication: Mobility status PT Visit Diagnosis: Other abnormalities of gait and mobility (R26.89);Difficulty in walking, not elsewhere classified (R26.2)     Time: 6269-4854 PT Time Calculation (min) (ACUTE ONLY): 28 min  Charges:  $Gait Training: 8-22 mins $Therapeutic Activity: 8-22 mins                     Baxter Flattery, PT  Acute Rehab Dept ([place]) [phone] Pager [phone]  05/22/2021    [facility] 05/22/2021, 11:26 AM

## 2021-05-22 NOTE — Progress Notes (Signed)
Patient did not pass PT, Jennette Kettle PA notified [facility] [employer]

## 2021-05-22 NOTE — Plan of Care (Signed)
  Problem: Coping: Goal: Level of anxiety will decrease Outcome: Progressing   Problem: Pain Managment: Goal: General experience of comfort will improve Outcome: Progressing   

## 2021-05-22 NOTE — Progress Notes (Signed)
Physical Therapy Treatment Patient Details Name: Rachael Osborn MRN: [id number] DOB: [birth year] Today's Date: 05/22/2021   History of Present Illness 66 yo female s/p L TKA. PMH: SVT, ablation    PT Comments    Pt progressing, having too much pain and soreness to try steps this pm, will see how pt does tomorrow. Hopeful for d/c next day  Recommendations for follow up therapy are one component of a multi-disciplinary discharge planning process, led by the attending physician.  Recommendations may be updated based on patient status, additional functional criteria and insurance authorization.  Follow Up Recommendations  Follow physician's recommendations for discharge plan and follow up therapies     Assistance Recommended at Discharge Intermittent Supervision/Assistance  Patient can return home with the following Help with stairs or ramp for entrance;Assist for transportation   Equipment Recommendations  BSC/3in1    Recommendations for Other Services       Precautions / Restrictions Precautions Precautions: Knee Required Braces or Orthoses: Knee Immobilizer - Left Restrictions Weight Bearing Restrictions: No Other Position/Activity Restrictions: WBAT     Mobility  Bed Mobility Overal bed mobility: Needs Assistance Bed Mobility: Supine to Sit, Sit to Supine     Supine to sit: Min assist Sit to supine: Min assist   General bed mobility comments: assist with LLE, incr time (just back to bed on PT arrival)    Transfers Overall transfer level: Needs assistance Equipment used: Rolling walker (2 wheels) Transfers: Sit to/from Stand, Bed to chair/wheelchair/BSC Sit to Stand: Min guard, Min assist           General transfer comment: cues for hand placement, sequence  and LLE position, assist to rise and transtion to RW    Ambulation/Gait Ambulation/Gait assistance: Min guard, Supervision Gait Distance (Feet): 35 Feet Assistive device: Rolling walker (2  wheels) Gait Pattern/deviations: Step-to pattern, Antalgic       General Gait Details: cues for sequence and RW position, extremely slow but steady gait   Stairs             Wheelchair Mobility    Modified Rankin (Stroke Patients Only)       Balance                                            Cognition Arousal/Alertness: Awake/alert Behavior During Therapy: WFL for tasks assessed/performed Overall Cognitive Status: Within Functional Limits for tasks assessed                                          Exercises Total Joint Exercises Ankle Circles/Pumps: AROM, Both, 10 reps Quad Sets: AROM, Both, 10 reps Heel Slides: AAROM, Left, 10 reps Hip ABduction/ADduction: AAROM, AROM, Left, 10 reps Straight Leg Raises: AAROM, Strengthening, Left, 10 reps Goniometric ROM: grossly 8 to 65 degrees    General Comments        Pertinent Vitals/Pain Pain Assessment Pain Assessment: 0-10 Faces Pain Scale: Hurts even more Pain Location: L knee Pain Descriptors / Indicators: Aching, Grimacing, Sore Pain Intervention(s): Limited activity within patient's tolerance, Monitored during session, Premedicated before session, Repositioned    Home Living                          Prior Function  PT Goals (current goals can now be found in the care plan section) Acute Rehab PT Goals Patient Stated Goal: home PT Goal Formulation: With patient Time For Goal Achievement: 05/28/21 Potential to Achieve Goals: Good Progress towards PT goals: Progressing toward goals    Frequency    7X/week      PT Plan Current plan remains appropriate    Co-evaluation              AM-PAC PT "6 Clicks" Mobility   Outcome Measure  Help needed turning from your back to your side while in a flat bed without using bedrails?: A Little Help needed moving from lying on your back to sitting on the side of a flat bed without using  bedrails?: A Little Help needed moving to and from a bed to a chair (including a wheelchair)?: A Little Help needed standing up from a chair using your arms (e.g., wheelchair or bedside chair)?: A Little Help needed to walk in hospital room?: A Little Help needed climbing 3-5 steps with a railing? : A Lot 6 Click Score: 17    End of Session Equipment Utilized During Treatment: Gait belt Activity Tolerance: Patient tolerated treatment well Patient left: with call bell/phone within reach;in chair;with chair alarm set Nurse Communication: Mobility status PT Visit Diagnosis: Other abnormalities of gait and mobility (R26.89);Difficulty in walking, not elsewhere classified (R26.2)     Time: 3614-4315 PT Time Calculation (min) (ACUTE ONLY): 25 min  Charges:  $Therapeutic Exercise: 8-22 mins $Therapeutic Activity: 8-22 mins                     ,t    [facility] 05/22/2021, 3:47 PM
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
## 2021-05-23 DIAGNOSIS — Z96652 Presence of left artificial knee joint: Secondary | ICD-10-CM

## 2021-05-23 DIAGNOSIS — M1712 Unilateral primary osteoarthritis, left knee: Secondary | ICD-10-CM | POA: Diagnosis not present

## 2021-05-23 LAB — CBC
HCT: 32.4 % — ABNORMAL LOW (ref 36.0–46.0)
Hemoglobin: 10.5 g/dL — ABNORMAL LOW (ref 12.0–15.0)
MCH: 27.5 pg (ref 26.0–34.0)
MCHC: 32.4 g/dL (ref 30.0–36.0)
MCV: 84.8 fL (ref 80.0–100.0)
Platelets: 223 10*3/uL (ref 150–400)
RBC: 3.82 MIL/uL — ABNORMAL LOW (ref 3.87–5.11)
RDW: 15.2 % (ref 11.5–15.5)
WBC: 8.1 10*3/uL (ref 4.0–10.5)
nRBC: 0 % (ref 0.0–0.2)

## 2021-05-23 LAB — GLUCOSE, CAPILLARY
Glucose-Capillary: 116 mg/dL — ABNORMAL HIGH (ref 70–99)
Glucose-Capillary: 108 mg/dL — ABNORMAL HIGH (ref 70–99)

## 2021-05-23 NOTE — Plan of Care (Signed)
  Problem: Coping: Goal: Level of anxiety will decrease Outcome: Progressing   Problem: Pain Managment: Goal: General experience of comfort will improve Outcome: Progressing   

## 2021-05-23 NOTE — Progress Notes (Signed)
Subjective: 3 Days Post-Op Procedure(s) (LRB): TOTAL KNEE ARTHROPLASTY (Left) Patient reports pain as mild and moderate.    Objective: Vital signs in last 24 hours: Temp:  [98.3 F (36.8 C)-99.2 F (37.3 C)] 98.3 F (36.8 C) (01/23 0525) Pulse Rate:  [93-101] 93 (01/23 0525) Resp:  [16-17] 17 (01/23 0525) BP: (116-131)/(72-83) 122/72 (01/23 0525) SpO2:  [94 %-98 %] 95 % (01/23 0525)  Intake/Output from previous day: 01/22 0701 - 01/23 0700 In: 720 [P.O.:720] Out: 1150 [Urine:1150] Intake/Output this shift: No intake/output data recorded.  Recent Labs    05/21/21 0303 05/22/21 0257 05/23/21 0313  HGB 11.3* 10.3* 10.5*   Recent Labs    05/22/21 0257 05/23/21 0313  WBC 7.8 8.1  RBC 3.73* 3.82*  HCT 31.8* 32.4*  PLT 214 223   Recent Labs    05/21/21 0303  NA 137  K 3.3*  CL 101  CO2 27  BUN 12  CREATININE 0.68  GLUCOSE 177*  CALCIUM 8.8*   No results for input(s): LABPT, INR in the last 72 hours.  Neurologically intact ABD soft Neurovascular intact Sensation intact distally Intact pulses distally Dorsiflexion/Plantar flexion intact Incision: dressing C/D/I and no drainage No cellulitis present Compartment soft No calf pain or sign of DVT   Assessment/Plan: 3 Days Post-Op Procedure(s) (LRB): TOTAL KNEE ARTHROPLASTY (Left) Advance diet Up with therapy D/C IV fluids Possible D/C later today if pain better controlled and passes PT Home health PT & home health aide arranged Will order bedside commode for home Has walker and cane  Discussed with Dr Valentino Saxon Deatra Robinson 05/23/2021, 9:47 AM

## 2021-05-23 NOTE — Progress Notes (Signed)
RN reviewed discharge instructions with patient and family. All questions answered.   Paperwork given. Prescriptions electronically sent to patient pharmacy.    NT rolled patient down with all belongings to family car.     Claudia Greenley, RN  

## 2021-05-23 NOTE — TOC Transition Note (Signed)
Transition of Care [facility]) - CM/SW Discharge Note   Patient Details  Name: Rachael Osborn MRN: [id number] Date of Birth: 12/11/1955  [facility] CM/SW Contact:  Lennart Pall, LCSW Phone Number: 05/23/2021, 2:47 PM   Clinical Narrative:    Met with pt this morning who is covered for her DME and HH follow up under Workers Comp claim.  She confirms that a rolling walker and cane were delivered to her home prior to surgery.  She is in need of a bedside commode now and unsure which agency will be providing HHPT.   Have contacted the Workers Comp Case Manager, Athena Masse [phone]) who requests order for 3n1 commode - PA placed order and this was emailed to her this morning with expectation that item will be shipped to home.  Re: HH, she and I have been awaiting confirmation from [facility] of agency and just received word that [facility] to provide the [place].  Contact for [facility] agency placed on AVS paperwork. No further TOC needs.   Final next level of care: [place] Barriers to Discharge: Barriers Resolved   Patient Goals and CMS Choice Patient states their goals for this hospitalization and ongoing recovery are:: return home      Discharge Placement                       Discharge Plan and Services                DME Arranged: 3-N-1, Walker rolling DME Agency:  (to be shipped by Workers Comp contracted DME agency)       [place]: PT, Nurse's Aide [place] Agency: [facility]        Social Determinants of Health (SDOH) Interventions     Readmission Risk Interventions No flowsheet data found.

## 2021-05-23 NOTE — Progress Notes (Signed)
05/23/21 1500  PT Visit Information  Last PT Received On 05/23/21  Assistance Needed +1  Pt progressing well this pm. Ready for d/c from PT standpoint with family assist as needed.  History of Present Illness 66 yo female s/p L TKA. PMH: SVT, ablation  Subjective Data  Patient Stated Goal home  Precautions  Precautions Knee;Fall  Required Braces or Orthoses Knee Immobilizer - Left  Restrictions  Other Position/Activity Restrictions WBAT  Pain Assessment  Pain Assessment Faces  Faces Pain Scale 4  Pain Location L knee  Pain Descriptors / Indicators Aching;Grimacing;Sore  Pain Intervention(s) Limited activity within patient's tolerance;Monitored during session;Premedicated before session;Repositioned  Cognition  Arousal/Alertness Awake/alert  Behavior During Therapy WFL for tasks assessed/performed  Overall Cognitive Status Within Functional Limits for tasks assessed  Bed Mobility  Overal bed mobility Needs Assistance  Bed Mobility Supine to Sit;Sit to Supine  Supine to sit Supervision  Sit to supine Min assist  General bed mobility comments assist with LLE on to bed even while using leg lifter  Transfers  Overall transfer level Needs assistance  Equipment used Rolling walker (2 wheels)  Transfers Sit to/from Stand  Sit to Stand Min guard  General transfer comment cues for hand placement and LLE position, incr time; from bed and toilet  Ambulation/Gait  Ambulation/Gait assistance Min guard;Supervision  Gait Distance (Feet) 35 Feet (15')  Assistive device Rolling walker (2 wheels)  Gait Pattern/deviations Step-to pattern;Antalgic  General Gait Details cues for sequence and RW position, improved wt shift to LLE  Stairs Yes  Stairs assistance Min assist;Min guard  Stair Management One rail Right;Two rails;Step to pattern;Forwards  Number of Stairs 5 (x2)  General stair comments cues for sequence; last 2 steps up/down  reviewed with rail and crutch; steady, no LOB, no  knee buckling  Total Joint Exercises  Ankle Circles/Pumps AROM;Both;10 reps  Goniometric ROM reviewed HEP Handout; pt to perform again later tonight  PT - End of Session  Equipment Utilized During Treatment Gait belt (KI not utilized as much improved quad activation)  Activity Tolerance Patient tolerated treatment well  Patient left in chair;with chair alarm set  Nurse Communication Mobility status   PT - Assessment/Plan  PT Plan Current plan remains appropriate  PT Visit Diagnosis Other abnormalities of gait and mobility (R26.89);Difficulty in walking, not elsewhere classified (R26.2)  PT Frequency (ACUTE ONLY) 7X/week  Follow Up Recommendations Follow physician's recommendations for discharge plan and follow up therapies  Assistance recommended at discharge Intermittent Supervision/Assistance  Patient can return home with the following Help with stairs or ramp for entrance;Assist for transportation  PT equipment QTM/2UQ3  AM-PAC PT "6 Clicks" Mobility Outcome Measure (Version 2)  Help needed turning from your back to your side while in a flat bed without using bedrails? 3  Help needed moving from lying on your back to sitting on the side of a flat bed without using bedrails? 3  Help needed moving to and from a bed to a chair (including a wheelchair)? 3  Help needed standing up from a chair using your arms (e.g., wheelchair or bedside chair)? 3  Help needed to walk in hospital room? 3  Help needed climbing 3-5 steps with a railing?  3  6 Click Score 18  Consider Recommendation of Discharge To: Home with [facility]  PT Goal Progression  Progress towards PT goals Progressing toward goals  Acute Rehab PT Goals  PT Goal Formulation With patient  Time For Goal Achievement 05/28/21  Potential to  Achieve Goals Good  PT Time Calculation  PT Start Time (ACUTE ONLY) 1504  PT Stop Time (ACUTE ONLY) 1527  PT Time Calculation (min) (ACUTE ONLY) 23 min  PT General Charges  $$ ACUTE PT VISIT 1 Visit   PT Treatments  $Gait Training 23-37 mins

## 2021-05-23 NOTE — Progress Notes (Signed)
Physical Therapy Treatment Patient Details Name: Rachael Osborn MRN: [id number] DOB: [birth year] Today's Date: 05/23/2021   History of Present Illness 66 yo female s/p L TKA. PMH: SVT, ablation    PT Comments    Progressing well in am session. Pain improved, activity tolerance and gait distance increased. Will try stairs this pm and pt is hopeful to d/c home later today    Recommendations for follow up therapy are one component of a multi-disciplinary discharge planning process, led by the attending physician.  Recommendations may be updated based on patient status, additional functional criteria and insurance authorization.  Follow Up Recommendations  Follow physician's recommendations for discharge plan and follow up therapies     Assistance Recommended at Discharge Intermittent Supervision/Assistance  Patient can return home with the following Help with stairs or ramp for entrance;Assist for transportation   Equipment Recommendations  BSC/3in1    Recommendations for Other Services       Precautions / Restrictions Precautions Precautions: Knee;Fall Required Braces or Orthoses: Knee Immobilizer - Left Restrictions LLE Weight Bearing: Weight bearing as tolerated Other Position/Activity Restrictions: WBAT     Mobility  Bed Mobility Overal bed mobility: Needs Assistance Bed Mobility: Supine to Sit, Sit to Supine     Supine to sit: Min assist Sit to supine: Min assist   General bed mobility comments: assist with LLE (just back to bed on PT arrival)    Transfers Overall transfer level: Needs assistance Equipment used: Rolling walker (2 wheels) Transfers: Sit to/from Stand Sit to Stand: Min guard           General transfer comment: cues for hand placement and LLE position, incr time    Ambulation/Gait Ambulation/Gait assistance: Min guard, Supervision Gait Distance (Feet): 60 Feet Assistive device: Rolling walker (2 wheels) Gait Pattern/deviations: Step-to  pattern, Antalgic       General Gait Details: cues for sequence and RW position, improved wt shift to LLE   Stairs             Wheelchair Mobility    Modified Rankin (Stroke Patients Only)       Balance                                            Cognition Arousal/Alertness: Awake/alert Behavior During Therapy: WFL for tasks assessed/performed Overall Cognitive Status: Within Functional Limits for tasks assessed                                          Exercises Total Joint Exercises Ankle Circles/Pumps: AROM, Both, 10 reps    General Comments        Pertinent Vitals/Pain Pain Assessment Pain Assessment: Faces Faces Pain Scale: Hurts little more Pain Location: L knee Pain Descriptors / Indicators: Aching, Grimacing, Sore Pain Intervention(s): Limited activity within patient's tolerance, Monitored during session, Premedicated before session, Ice applied    Home Living                          Prior Function            PT Goals (current goals can now be found in the care plan section) Acute Rehab PT Goals Patient Stated Goal: home PT Goal Formulation: With patient Time For  Goal Achievement: 05/28/21 Potential to Achieve Goals: Good    Frequency    7X/week      PT Plan Current plan remains appropriate    Co-evaluation              AM-PAC PT "6 Clicks" Mobility   Outcome Measure  Help needed turning from your back to your side while in a flat bed without using bedrails?: A Little Help needed moving from lying on your back to sitting on the side of a flat bed without using bedrails?: A Little Help needed moving to and from a bed to a chair (including a wheelchair)?: A Little Help needed standing up from a chair using your arms (e.g., wheelchair or bedside chair)?: A Little Help needed to walk in hospital room?: A Little Help needed climbing 3-5 steps with a railing? : A Lot 6 Click Score:  17    End of Session Equipment Utilized During Treatment: Gait belt;Left knee immobilizer Activity Tolerance: Patient tolerated treatment well Patient left: in chair;with chair alarm set Nurse Communication: Mobility status PT Visit Diagnosis: Other abnormalities of gait and mobility (R26.89);Difficulty in walking, not elsewhere classified (R26.2)     Time: 9371-6967 PT Time Calculation (min) (ACUTE ONLY): 26 min  Charges:  $Gait Training: 23-37 mins                     Baxter Flattery, PT  Acute Rehab Dept ([place]) [phone] Pager [phone]  05/23/2021    [facility] 05/23/2021, 2:16 PM

## 2021-05-24 ENCOUNTER — Encounter (HOSPITAL_COMMUNITY): Payer: Self-pay | Admitting: Specialist

## 2021-05-26 ENCOUNTER — Other Ambulatory Visit (HOSPITAL_COMMUNITY): Payer: Self-pay

## 2021-05-27 ENCOUNTER — Other Ambulatory Visit: Payer: Self-pay

## 2021-05-31 ENCOUNTER — Ambulatory Visit (HOSPITAL_BASED_OUTPATIENT_CLINIC_OR_DEPARTMENT_OTHER)
Admission: RE | Admit: 2021-05-31 | Discharge: 2021-05-31 | Disposition: A | Discharge status: Home / Self Care | Payer: No Typology Code available for payment source | Attending: Specialist | Admitting: Specialist

## 2021-05-31 ENCOUNTER — Other Ambulatory Visit: Payer: Self-pay

## 2021-05-31 ENCOUNTER — Other Ambulatory Visit (HOSPITAL_BASED_OUTPATIENT_CLINIC_OR_DEPARTMENT_OTHER): Payer: Self-pay | Admitting: Specialist

## 2021-05-31 ENCOUNTER — Ambulatory Visit (HOSPITAL_BASED_OUTPATIENT_CLINIC_OR_DEPARTMENT_OTHER)
Admission: RE | Admit: 2021-05-31 | Discharge: 2021-05-31 | Disposition: A | Discharge status: Home / Self Care | Payer: No Typology Code available for payment source | Source: Ambulatory Visit | Attending: Specialist | Admitting: Specialist

## 2021-05-31 DIAGNOSIS — R52 Pain, unspecified: Secondary | ICD-10-CM

## 2021-06-03 ENCOUNTER — Other Ambulatory Visit: Payer: Self-pay

## 2021-06-03 MED ORDER — MONTELUKAST SODIUM 10 MG PO TABS: 10.0000 mg | ORAL_TABLET | Freq: Every day | ORAL | 0 refills | Status: DC
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
## 2021-06-13 ENCOUNTER — Encounter: Payer: Self-pay | Admitting: Physical Therapy

## 2021-06-13 ENCOUNTER — Ambulatory Visit: Payer: No Typology Code available for payment source | Attending: Specialist | Admitting: Physical Therapy

## 2021-06-13 ENCOUNTER — Ambulatory Visit: Payer: PPO | Admitting: Internal Medicine

## 2021-06-13 ENCOUNTER — Encounter: Payer: Self-pay | Admitting: Internal Medicine

## 2021-06-13 ENCOUNTER — Other Ambulatory Visit: Payer: Self-pay

## 2021-06-13 VITALS — BP 98/60 | HR 93 | Temp 98.0°F | Resp 18

## 2021-06-13 DIAGNOSIS — J4530 Mild persistent asthma, uncomplicated: Secondary | ICD-10-CM | POA: Diagnosis not present

## 2021-06-13 DIAGNOSIS — G8929 Other chronic pain: Secondary | ICD-10-CM | POA: Diagnosis present

## 2021-06-13 DIAGNOSIS — K219 Gastro-esophageal reflux disease without esophagitis: Secondary | ICD-10-CM

## 2021-06-13 DIAGNOSIS — J3089 Other allergic rhinitis: Secondary | ICD-10-CM

## 2021-06-13 DIAGNOSIS — M6281 Muscle weakness (generalized): Secondary | ICD-10-CM | POA: Insufficient documentation

## 2021-06-13 DIAGNOSIS — R600 Localized edema: Secondary | ICD-10-CM | POA: Diagnosis present

## 2021-06-13 DIAGNOSIS — M25661 Stiffness of right knee, not elsewhere classified: Secondary | ICD-10-CM | POA: Diagnosis present

## 2021-06-13 DIAGNOSIS — R262 Difficulty in walking, not elsewhere classified: Secondary | ICD-10-CM | POA: Diagnosis present

## 2021-06-13 DIAGNOSIS — M25562 Pain in left knee: Secondary | ICD-10-CM | POA: Insufficient documentation

## 2021-06-13 DIAGNOSIS — J302 Other seasonal allergic rhinitis: Secondary | ICD-10-CM

## 2021-06-13 MED ORDER — MONTELUKAST SODIUM 10 MG PO TABS
10.0000 mg | ORAL_TABLET | Freq: Every day | ORAL | 1 refills | Status: DC
Start: 2021-06-13 — End: 2021-10-04

## 2021-06-13 MED ORDER — FLUTICASONE PROPIONATE 50 MCG/ACT NA SUSP: NASAL | 1 refills | Status: DC

## 2021-06-13 MED ORDER — ALBUTEROL SULFATE (2.5 MG/3ML) 0.083% IN NEBU: 2.5000 mg | INHALATION_SOLUTION | Freq: Four times a day (QID) | RESPIRATORY_TRACT | 1 refills | Status: AC | PRN

## 2021-06-13 MED ORDER — LORATADINE 10 MG PO TABS: ORAL_TABLET | ORAL | 1 refills | Status: DC

## 2021-06-13 MED ORDER — ALBUTEROL SULFATE HFA 108 (90 BASE) MCG/ACT IN AERS: 2.0000 | INHALATION_SPRAY | RESPIRATORY_TRACT | 2 refills | Status: DC | PRN

## 2021-06-13 MED ORDER — ALBUTEROL SULFATE (2.5 MG/3ML) 0.083% IN NEBU: 2.5000 mg | INHALATION_SOLUTION | RESPIRATORY_TRACT | 1 refills | Status: DC | PRN

## 2021-06-13 MED ORDER — AZELASTINE HCL 0.1 % NA SOLN: 2.0000 | Freq: Two times a day (BID) | NASAL | 1 refills | Status: DC

## 2021-06-13 NOTE — Patient Instructions (Addendum)
Asthma-controlled Continue montelukast 10 mg once a day to prevent cough or wheeze  Continue albuterol 2 puffs every 4 hours as needed for cough or wheeze OR Instead use albuterol 0.083% solution via nebulizer one unit vial every 4 hours as needed for cough or wheeze For asthma flare, begin Alvesco 80-2 puffs twice a day for 2 weeks or until cough and wheeze free. Sample provided today.  Allergic rhinitis-recent flare with Eustachian tube dysfunction Continue allergen avoidance measures directed toward grass pollen, weed pollen, tree pollen, and dust mite  Continue loratadine 10 mg once a day as needed for runny nose or itch.  You may take an additional 10 mg tablet for breakthrough symptoms Continue Flonase nasal spray 2 sprays in each nostril once a day-Use daily for the next 2 weeks to help with ear pressure and nasal congestion/drainage Continue azelastine 1 to 2 sprays in each nostril twice a day as needed for runny nose  Consider saline nasal rinses or saline mist as needed for nasal symptoms. Use this before any medicated nasal sprays for best result  Reflux Continue dietary and lifestyle modifications as listed below Continue pantoprazole once a day as previously prescribed  Call the clinic if this treatment plan is not working well for you  Follow up in 6 months or sooner if needed.
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
## 2021-06-14 ENCOUNTER — Ambulatory Visit: Payer: No Typology Code available for payment source | Admitting: Physical Therapy

## 2021-06-14 ENCOUNTER — Encounter: Payer: Self-pay | Admitting: Physical Therapy

## 2021-06-14 ENCOUNTER — Encounter (HOSPITAL_COMMUNITY): Payer: Self-pay

## 2021-06-14 DIAGNOSIS — R600 Localized edema: Secondary | ICD-10-CM

## 2021-06-14 DIAGNOSIS — M25562 Pain in left knee: Secondary | ICD-10-CM

## 2021-06-14 DIAGNOSIS — M6281 Muscle weakness (generalized): Secondary | ICD-10-CM

## 2021-06-14 DIAGNOSIS — R262 Difficulty in walking, not elsewhere classified: Secondary | ICD-10-CM
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
## 2021-06-17 ENCOUNTER — Ambulatory Visit: Payer: No Typology Code available for payment source | Admitting: Physical Therapy

## 2021-06-17 ENCOUNTER — Encounter: Payer: Self-pay | Admitting: Physical Therapy

## 2021-06-17 ENCOUNTER — Other Ambulatory Visit: Payer: Self-pay

## 2021-06-17 DIAGNOSIS — M25562 Pain in left knee: Secondary | ICD-10-CM

## 2021-06-17 DIAGNOSIS — M6281 Muscle weakness (generalized): Secondary | ICD-10-CM

## 2021-06-17 DIAGNOSIS — R600 Localized edema: Secondary | ICD-10-CM

## 2021-06-17 DIAGNOSIS — R262 Difficulty in walking, not elsewhere classified: Secondary | ICD-10-CM
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
## 2021-06-20 ENCOUNTER — Encounter: Payer: Self-pay | Admitting: Physical Therapy

## 2021-06-20 ENCOUNTER — Other Ambulatory Visit: Payer: Self-pay

## 2021-06-20 ENCOUNTER — Ambulatory Visit: Payer: No Typology Code available for payment source | Admitting: Physical Therapy

## 2021-06-20 DIAGNOSIS — M25562 Pain in left knee: Secondary | ICD-10-CM | POA: Diagnosis not present

## 2021-06-20 DIAGNOSIS — R262 Difficulty in walking, not elsewhere classified: Secondary | ICD-10-CM

## 2021-06-20 DIAGNOSIS — M6281 Muscle weakness (generalized): Secondary | ICD-10-CM

## 2021-06-20 DIAGNOSIS — R600 Localized edema: Secondary | ICD-10-CM
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
## 2021-06-21 ENCOUNTER — Other Ambulatory Visit (HOSPITAL_COMMUNITY): Payer: Self-pay

## 2021-06-21 ENCOUNTER — Other Ambulatory Visit: Payer: Self-pay | Admitting: Orthopedic Surgery

## 2021-06-21 ENCOUNTER — Encounter: Payer: Self-pay | Admitting: *Deleted

## 2021-06-22 ENCOUNTER — Ambulatory Visit: Payer: No Typology Code available for payment source | Admitting: Physical Therapy

## 2021-06-22 ENCOUNTER — Other Ambulatory Visit: Payer: Self-pay | Admitting: Orthopedic Surgery

## 2021-06-22 ENCOUNTER — Encounter: Payer: Self-pay | Admitting: Physical Therapy

## 2021-06-22 ENCOUNTER — Other Ambulatory Visit: Payer: Self-pay

## 2021-06-22 DIAGNOSIS — R262 Difficulty in walking, not elsewhere classified: Secondary | ICD-10-CM

## 2021-06-22 DIAGNOSIS — G8929 Other chronic pain: Secondary | ICD-10-CM

## 2021-06-22 DIAGNOSIS — M25661 Stiffness of right knee, not elsewhere classified: Secondary | ICD-10-CM

## 2021-06-22 DIAGNOSIS — M6281 Muscle weakness (generalized): Secondary | ICD-10-CM

## 2021-06-22 DIAGNOSIS — R600 Localized edema: Secondary | ICD-10-CM

## 2021-06-22 DIAGNOSIS — M25562 Pain in left knee: Secondary | ICD-10-CM | POA: Diagnosis not present

## 2021-06-22 NOTE — Progress Notes (Signed)
Patient's admission for total knee replacement on the left from 05/20/21-05/23/21 should be considered inpatient status, not outpatient.
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
## 2021-06-23 ENCOUNTER — Other Ambulatory Visit (HOSPITAL_COMMUNITY): Payer: Self-pay

## 2021-06-24 ENCOUNTER — Ambulatory Visit: Payer: No Typology Code available for payment source | Attending: Specialist | Admitting: Physical Therapy

## 2021-06-24 ENCOUNTER — Other Ambulatory Visit: Payer: Self-pay

## 2021-06-24 ENCOUNTER — Other Ambulatory Visit (HOSPITAL_COMMUNITY): Payer: Self-pay

## 2021-06-24 ENCOUNTER — Encounter: Payer: Self-pay | Admitting: Physical Therapy

## 2021-06-24 DIAGNOSIS — M25562 Pain in left knee: Secondary | ICD-10-CM | POA: Insufficient documentation

## 2021-06-24 DIAGNOSIS — G8929 Other chronic pain: Secondary | ICD-10-CM | POA: Diagnosis present

## 2021-06-24 DIAGNOSIS — M6281 Muscle weakness (generalized): Secondary | ICD-10-CM | POA: Insufficient documentation

## 2021-06-24 DIAGNOSIS — R600 Localized edema: Secondary | ICD-10-CM | POA: Insufficient documentation
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
## 2021-06-27 ENCOUNTER — Encounter: Payer: Self-pay | Admitting: Physical Therapy

## 2021-06-27 ENCOUNTER — Other Ambulatory Visit: Payer: Self-pay

## 2021-06-27 ENCOUNTER — Ambulatory Visit: Payer: No Typology Code available for payment source | Admitting: Physical Therapy

## 2021-06-27 DIAGNOSIS — R262 Difficulty in walking, not elsewhere classified: Secondary | ICD-10-CM

## 2021-06-27 DIAGNOSIS — M25562 Pain in left knee: Secondary | ICD-10-CM

## 2021-06-27 DIAGNOSIS — R600 Localized edema: Secondary | ICD-10-CM

## 2021-06-27 DIAGNOSIS — G8929 Other chronic pain: Secondary | ICD-10-CM

## 2021-06-27 DIAGNOSIS — M6281 Muscle weakness (generalized): Secondary | ICD-10-CM
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
## 2021-06-28 ENCOUNTER — Other Ambulatory Visit: Payer: Self-pay | Admitting: Family Medicine

## 2021-06-29 ENCOUNTER — Other Ambulatory Visit: Payer: Self-pay

## 2021-06-29 ENCOUNTER — Ambulatory Visit: Payer: No Typology Code available for payment source | Admitting: Physical Therapy

## 2021-06-29 ENCOUNTER — Encounter: Payer: Self-pay | Admitting: Physical Therapy

## 2021-06-29 ENCOUNTER — Ambulatory Visit: Payer: No Typology Code available for payment source | Attending: Specialist | Admitting: Physical Therapy

## 2021-06-29 DIAGNOSIS — R262 Difficulty in walking, not elsewhere classified: Secondary | ICD-10-CM | POA: Insufficient documentation

## 2021-06-29 DIAGNOSIS — M25562 Pain in left knee: Secondary | ICD-10-CM | POA: Diagnosis not present

## 2021-06-29 DIAGNOSIS — M6281 Muscle weakness (generalized): Secondary | ICD-10-CM | POA: Insufficient documentation

## 2021-06-29 DIAGNOSIS — R600 Localized edema: Secondary | ICD-10-CM | POA: Diagnosis present

## 2021-06-29 DIAGNOSIS — G8929 Other chronic pain: Secondary | ICD-10-CM | POA: Insufficient documentation
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
## 2021-07-01 ENCOUNTER — Encounter: Payer: PPO | Admitting: Physical Therapy

## 2021-07-01 ENCOUNTER — Encounter: Payer: Self-pay | Admitting: Physical Therapy

## 2021-07-01 ENCOUNTER — Other Ambulatory Visit: Payer: Self-pay

## 2021-07-01 ENCOUNTER — Ambulatory Visit: Payer: No Typology Code available for payment source | Admitting: Physical Therapy

## 2021-07-01 ENCOUNTER — Ambulatory Visit: Payer: No Typology Code available for payment source | Attending: Specialist | Admitting: Physical Therapy

## 2021-07-01 DIAGNOSIS — R600 Localized edema: Secondary | ICD-10-CM

## 2021-07-01 DIAGNOSIS — M25562 Pain in left knee: Secondary | ICD-10-CM | POA: Diagnosis not present

## 2021-07-01 DIAGNOSIS — M6281 Muscle weakness (generalized): Secondary | ICD-10-CM

## 2021-07-01 DIAGNOSIS — R262 Difficulty in walking, not elsewhere classified: Secondary | ICD-10-CM

## 2021-07-01 DIAGNOSIS — G8929 Other chronic pain: Secondary | ICD-10-CM
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
## 2021-07-02 ENCOUNTER — Other Ambulatory Visit: Payer: Self-pay | Admitting: Family Medicine

## 2021-07-04 ENCOUNTER — Other Ambulatory Visit: Payer: Self-pay

## 2021-07-04 ENCOUNTER — Encounter: Payer: Self-pay | Admitting: Physical Therapy

## 2021-07-04 ENCOUNTER — Ambulatory Visit: Payer: No Typology Code available for payment source | Admitting: Physical Therapy

## 2021-07-04 DIAGNOSIS — R600 Localized edema: Secondary | ICD-10-CM

## 2021-07-04 DIAGNOSIS — M25562 Pain in left knee: Secondary | ICD-10-CM | POA: Diagnosis not present

## 2021-07-04 DIAGNOSIS — R262 Difficulty in walking, not elsewhere classified: Secondary | ICD-10-CM

## 2021-07-04 DIAGNOSIS — M6281 Muscle weakness (generalized): Secondary | ICD-10-CM
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
## 2021-07-06 ENCOUNTER — Ambulatory Visit: Payer: No Typology Code available for payment source | Admitting: Physical Therapy

## 2021-07-06 ENCOUNTER — Other Ambulatory Visit: Payer: Self-pay

## 2021-07-06 ENCOUNTER — Encounter: Payer: Self-pay | Admitting: Physical Therapy

## 2021-07-06 DIAGNOSIS — R600 Localized edema: Secondary | ICD-10-CM

## 2021-07-06 DIAGNOSIS — R262 Difficulty in walking, not elsewhere classified: Secondary | ICD-10-CM

## 2021-07-06 DIAGNOSIS — M25562 Pain in left knee: Secondary | ICD-10-CM | POA: Diagnosis not present

## 2021-07-06 DIAGNOSIS — M6281 Muscle weakness (generalized): Secondary | ICD-10-CM
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
## 2021-07-08 ENCOUNTER — Other Ambulatory Visit: Payer: Self-pay

## 2021-07-08 ENCOUNTER — Encounter: Payer: Self-pay | Admitting: Physical Therapy

## 2021-07-08 ENCOUNTER — Ambulatory Visit: Payer: No Typology Code available for payment source | Admitting: Physical Therapy

## 2021-07-08 DIAGNOSIS — M25562 Pain in left knee: Secondary | ICD-10-CM

## 2021-07-08 DIAGNOSIS — R600 Localized edema: Secondary | ICD-10-CM

## 2021-07-08 DIAGNOSIS — M6281 Muscle weakness (generalized): Secondary | ICD-10-CM

## 2021-07-08 DIAGNOSIS — R262 Difficulty in walking, not elsewhere classified: Secondary | ICD-10-CM
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
## 2021-07-11 ENCOUNTER — Encounter: Payer: Self-pay | Admitting: Physical Therapy

## 2021-07-11 ENCOUNTER — Other Ambulatory Visit: Payer: Self-pay

## 2021-07-11 ENCOUNTER — Ambulatory Visit: Payer: No Typology Code available for payment source | Admitting: Physical Therapy

## 2021-07-11 DIAGNOSIS — M6281 Muscle weakness (generalized): Secondary | ICD-10-CM

## 2021-07-11 DIAGNOSIS — R262 Difficulty in walking, not elsewhere classified: Secondary | ICD-10-CM

## 2021-07-11 DIAGNOSIS — R600 Localized edema: Secondary | ICD-10-CM

## 2021-07-11 DIAGNOSIS — M25562 Pain in left knee: Secondary | ICD-10-CM
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
## 2021-07-13 ENCOUNTER — Other Ambulatory Visit: Payer: Self-pay

## 2021-07-13 ENCOUNTER — Ambulatory Visit: Payer: No Typology Code available for payment source | Attending: Specialist | Admitting: Physical Therapy

## 2021-07-13 ENCOUNTER — Encounter: Payer: Self-pay | Admitting: Physical Therapy

## 2021-07-13 DIAGNOSIS — R600 Localized edema: Secondary | ICD-10-CM | POA: Insufficient documentation

## 2021-07-13 DIAGNOSIS — M6281 Muscle weakness (generalized): Secondary | ICD-10-CM | POA: Diagnosis present

## 2021-07-13 DIAGNOSIS — M25562 Pain in left knee: Secondary | ICD-10-CM | POA: Insufficient documentation

## 2021-07-13 DIAGNOSIS — R262 Difficulty in walking, not elsewhere classified: Secondary | ICD-10-CM | POA: Diagnosis present
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
## 2021-07-18 ENCOUNTER — Encounter: Payer: Self-pay | Admitting: Physical Therapy

## 2021-07-18 ENCOUNTER — Other Ambulatory Visit: Payer: Self-pay

## 2021-07-18 ENCOUNTER — Ambulatory Visit: Payer: No Typology Code available for payment source | Admitting: Physical Therapy

## 2021-07-18 DIAGNOSIS — G8929 Other chronic pain: Secondary | ICD-10-CM

## 2021-07-18 DIAGNOSIS — R600 Localized edema: Secondary | ICD-10-CM

## 2021-07-18 DIAGNOSIS — R262 Difficulty in walking, not elsewhere classified: Secondary | ICD-10-CM

## 2021-07-18 DIAGNOSIS — M6281 Muscle weakness (generalized): Secondary | ICD-10-CM

## 2021-07-18 DIAGNOSIS — M25562 Pain in left knee: Secondary | ICD-10-CM | POA: Diagnosis not present
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
## 2021-07-20 ENCOUNTER — Ambulatory Visit: Payer: No Typology Code available for payment source | Attending: Specialist | Admitting: Physical Therapy

## 2021-07-20 ENCOUNTER — Encounter: Payer: Self-pay | Admitting: Physical Therapy

## 2021-07-20 ENCOUNTER — Other Ambulatory Visit: Payer: Self-pay

## 2021-07-20 ENCOUNTER — Ambulatory Visit: Payer: No Typology Code available for payment source | Admitting: Physical Therapy

## 2021-07-20 DIAGNOSIS — M25562 Pain in left knee: Secondary | ICD-10-CM | POA: Diagnosis not present

## 2021-07-20 DIAGNOSIS — R262 Difficulty in walking, not elsewhere classified: Secondary | ICD-10-CM | POA: Diagnosis present

## 2021-07-20 DIAGNOSIS — M6281 Muscle weakness (generalized): Secondary | ICD-10-CM | POA: Insufficient documentation

## 2021-07-20 DIAGNOSIS — R600 Localized edema: Secondary | ICD-10-CM | POA: Diagnosis present
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
## 2021-07-21 ENCOUNTER — Encounter: Payer: PPO | Admitting: Physical Therapy

## 2021-07-22 ENCOUNTER — Ambulatory Visit: Payer: No Typology Code available for payment source | Admitting: Physical Therapy

## 2021-07-22 ENCOUNTER — Other Ambulatory Visit: Payer: Self-pay

## 2021-07-22 ENCOUNTER — Encounter: Payer: Self-pay | Admitting: Physical Therapy

## 2021-07-22 DIAGNOSIS — R262 Difficulty in walking, not elsewhere classified: Secondary | ICD-10-CM

## 2021-07-22 DIAGNOSIS — M25562 Pain in left knee: Secondary | ICD-10-CM | POA: Diagnosis not present

## 2021-07-22 DIAGNOSIS — M6281 Muscle weakness (generalized): Secondary | ICD-10-CM

## 2021-07-22 DIAGNOSIS — R600 Localized edema: Secondary | ICD-10-CM
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
## 2021-07-25 ENCOUNTER — Ambulatory Visit: Payer: No Typology Code available for payment source | Admitting: Physical Therapy

## 2021-07-25 ENCOUNTER — Other Ambulatory Visit: Payer: Self-pay

## 2021-07-25 ENCOUNTER — Encounter: Payer: Self-pay | Admitting: Physical Therapy

## 2021-07-25 DIAGNOSIS — M6281 Muscle weakness (generalized): Secondary | ICD-10-CM

## 2021-07-25 DIAGNOSIS — M25562 Pain in left knee: Secondary | ICD-10-CM | POA: Diagnosis not present

## 2021-07-25 DIAGNOSIS — R600 Localized edema: Secondary | ICD-10-CM

## 2021-07-25 DIAGNOSIS — R262 Difficulty in walking, not elsewhere classified: Secondary | ICD-10-CM
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
## 2021-07-26 ENCOUNTER — Encounter: Payer: PPO | Admitting: Physical Therapy

## 2021-07-27 ENCOUNTER — Encounter: Payer: Self-pay | Admitting: Physical Therapy

## 2021-07-27 ENCOUNTER — Ambulatory Visit: Payer: No Typology Code available for payment source | Admitting: Physical Therapy

## 2021-07-27 DIAGNOSIS — G8929 Other chronic pain: Secondary | ICD-10-CM

## 2021-07-27 DIAGNOSIS — M25562 Pain in left knee: Secondary | ICD-10-CM | POA: Diagnosis not present

## 2021-07-27 DIAGNOSIS — R262 Difficulty in walking, not elsewhere classified: Secondary | ICD-10-CM

## 2021-07-27 DIAGNOSIS — R600 Localized edema: Secondary | ICD-10-CM

## 2021-07-27 DIAGNOSIS — M6281 Muscle weakness (generalized): Secondary | ICD-10-CM
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
## 2021-07-28 ENCOUNTER — Encounter: Payer: PPO | Admitting: Physical Therapy

## 2021-07-29 ENCOUNTER — Encounter: Payer: Self-pay | Admitting: Physical Therapy

## 2021-07-29 ENCOUNTER — Ambulatory Visit: Payer: No Typology Code available for payment source | Admitting: Physical Therapy

## 2021-07-29 DIAGNOSIS — M6281 Muscle weakness (generalized): Secondary | ICD-10-CM

## 2021-07-29 DIAGNOSIS — M25562 Pain in left knee: Secondary | ICD-10-CM

## 2021-07-29 DIAGNOSIS — R600 Localized edema: Secondary | ICD-10-CM
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
## 2021-08-01 ENCOUNTER — Ambulatory Visit: Payer: No Typology Code available for payment source | Admitting: Physical Therapy

## 2021-08-02 ENCOUNTER — Encounter: Payer: PPO | Admitting: Physical Therapy

## 2021-08-03 ENCOUNTER — Encounter: Payer: Self-pay | Admitting: Physical Therapy

## 2021-08-03 ENCOUNTER — Ambulatory Visit: Payer: No Typology Code available for payment source | Attending: Specialist | Admitting: Physical Therapy

## 2021-08-03 DIAGNOSIS — R600 Localized edema: Secondary | ICD-10-CM | POA: Diagnosis present

## 2021-08-03 DIAGNOSIS — R262 Difficulty in walking, not elsewhere classified: Secondary | ICD-10-CM | POA: Diagnosis present

## 2021-08-03 DIAGNOSIS — M6281 Muscle weakness (generalized): Secondary | ICD-10-CM

## 2021-08-03 DIAGNOSIS — G8929 Other chronic pain: Secondary | ICD-10-CM

## 2021-08-03 DIAGNOSIS — M25562 Pain in left knee: Secondary | ICD-10-CM | POA: Diagnosis present

## 2021-08-03 NOTE — Patient Instructions (Signed)
Access Code: [email or web address] ?URL: [email or web address] ?Date: 08/03/2021 ?Prepared by: Ethel Rana ? ?Exercises ?- Seated Long Arc Quad  - 1 x daily - 7 x weekly - 3 sets - 10 reps ?- Seated March  - 1 x daily - 7 x weekly - 3 sets - 10 reps ?- Standing Knee Flexion AROM with Chair Support  - 1 x daily - 7 x weekly - 3 sets - 10 reps ?- Supine Straight Leg Raises  - 1 x daily - 7 x weekly - 2 sets - 10 reps ?- Straight Leg Raise with External Rotation  - 1 x daily - 7 x weekly - 2 sets - 10 reps ?- Hooklying Clamshell with Resistance  - 1 x daily - 7 x weekly - 2 sets - 10 reps ?- Supine Bridge  - 1 x daily - 7 x weekly - 2 sets - 10 reps ?- Seated Knee Flexion Stretch  - 1 x daily - 7 x weekly - 2 sets - 10 reps ?- Step Up  - 1 x daily - 7 x weekly - 2 sets - 10 reps ?- Lateral Step Up  - 1 x daily - 7 x weekly - 2 sets - 10 reps ?
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
## 2021-08-04 ENCOUNTER — Encounter: Payer: PPO | Admitting: Physical Therapy

## 2021-08-05 ENCOUNTER — Ambulatory Visit: Payer: No Typology Code available for payment source | Admitting: Physical Therapy

## 2021-08-05 ENCOUNTER — Encounter: Payer: Self-pay | Admitting: Physical Therapy

## 2021-08-05 DIAGNOSIS — R600 Localized edema: Secondary | ICD-10-CM

## 2021-08-05 DIAGNOSIS — M6281 Muscle weakness (generalized): Secondary | ICD-10-CM

## 2021-08-05 DIAGNOSIS — G8929 Other chronic pain: Secondary | ICD-10-CM

## 2021-08-05 DIAGNOSIS — R262 Difficulty in walking, not elsewhere classified: Secondary | ICD-10-CM

## 2021-08-05 DIAGNOSIS — M25562 Pain in left knee: Secondary | ICD-10-CM
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
## 2021-08-09 ENCOUNTER — Ambulatory Visit: Payer: No Typology Code available for payment source | Admitting: Physical Therapy

## 2021-08-09 ENCOUNTER — Encounter: Payer: Self-pay | Admitting: Physical Therapy

## 2021-08-09 DIAGNOSIS — M25562 Pain in left knee: Secondary | ICD-10-CM

## 2021-08-09 DIAGNOSIS — R262 Difficulty in walking, not elsewhere classified: Secondary | ICD-10-CM

## 2021-08-09 DIAGNOSIS — R600 Localized edema: Secondary | ICD-10-CM

## 2021-08-09 DIAGNOSIS — M6281 Muscle weakness (generalized): Secondary | ICD-10-CM
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
## 2021-08-11 ENCOUNTER — Encounter: Payer: Self-pay | Admitting: Physical Therapy

## 2021-08-11 ENCOUNTER — Ambulatory Visit: Payer: No Typology Code available for payment source | Admitting: Physical Therapy

## 2021-08-11 DIAGNOSIS — M25562 Pain in left knee: Secondary | ICD-10-CM | POA: Diagnosis not present

## 2021-08-11 DIAGNOSIS — R262 Difficulty in walking, not elsewhere classified: Secondary | ICD-10-CM

## 2021-08-11 DIAGNOSIS — R600 Localized edema: Secondary | ICD-10-CM

## 2021-08-11 DIAGNOSIS — G8929 Other chronic pain: Secondary | ICD-10-CM

## 2021-08-11 DIAGNOSIS — M6281 Muscle weakness (generalized): Secondary | ICD-10-CM
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
## 2021-08-15 ENCOUNTER — Other Ambulatory Visit: Payer: Self-pay

## 2021-08-15 ENCOUNTER — Encounter: Payer: Self-pay | Admitting: Physical Therapy

## 2021-08-15 ENCOUNTER — Other Ambulatory Visit: Payer: Self-pay | Admitting: *Deleted

## 2021-08-15 ENCOUNTER — Ambulatory Visit: Payer: No Typology Code available for payment source | Admitting: Physical Therapy

## 2021-08-15 DIAGNOSIS — M25562 Pain in left knee: Secondary | ICD-10-CM

## 2021-08-15 DIAGNOSIS — M6281 Muscle weakness (generalized): Secondary | ICD-10-CM

## 2021-08-15 DIAGNOSIS — R600 Localized edema: Secondary | ICD-10-CM

## 2021-08-15 DIAGNOSIS — R262 Difficulty in walking, not elsewhere classified: Secondary | ICD-10-CM

## 2021-08-15 DIAGNOSIS — D0511 Intraductal carcinoma in situ of right breast: Secondary | ICD-10-CM
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
## 2021-08-16 ENCOUNTER — Other Ambulatory Visit: Payer: Self-pay

## 2021-08-16 ENCOUNTER — Encounter: Payer: Self-pay | Admitting: Physical Therapy

## 2021-08-16 ENCOUNTER — Ambulatory Visit: Payer: No Typology Code available for payment source | Attending: Specialist | Admitting: Physical Therapy

## 2021-08-16 ENCOUNTER — Other Ambulatory Visit: Payer: Self-pay | Admitting: Hematology and Oncology

## 2021-08-16 ENCOUNTER — Other Ambulatory Visit (HOSPITAL_COMMUNITY): Payer: Self-pay

## 2021-08-16 ENCOUNTER — Encounter: Payer: Self-pay | Admitting: Hematology and Oncology

## 2021-08-16 ENCOUNTER — Inpatient Hospital Stay: Payer: PPO | Attending: Hematology and Oncology

## 2021-08-16 ENCOUNTER — Inpatient Hospital Stay (HOSPITAL_BASED_OUTPATIENT_CLINIC_OR_DEPARTMENT_OTHER): Payer: PPO | Admitting: Hematology and Oncology

## 2021-08-16 VITALS — BP 113/78 | HR 86 | Temp 97.9°F | Resp 18 | Ht 66.0 in | Wt 179.8 lb

## 2021-08-16 DIAGNOSIS — Z13820 Encounter for screening for osteoporosis: Secondary | ICD-10-CM | POA: Diagnosis not present

## 2021-08-16 DIAGNOSIS — Z79811 Long term (current) use of aromatase inhibitors: Secondary | ICD-10-CM | POA: Insufficient documentation

## 2021-08-16 DIAGNOSIS — Z800 Family history of malignant neoplasm of digestive organs: Secondary | ICD-10-CM | POA: Insufficient documentation

## 2021-08-16 DIAGNOSIS — M25562 Pain in left knee: Secondary | ICD-10-CM | POA: Insufficient documentation

## 2021-08-16 DIAGNOSIS — Z923 Personal history of irradiation: Secondary | ICD-10-CM | POA: Insufficient documentation

## 2021-08-16 DIAGNOSIS — M6281 Muscle weakness (generalized): Secondary | ICD-10-CM | POA: Diagnosis present

## 2021-08-16 DIAGNOSIS — Z803 Family history of malignant neoplasm of breast: Secondary | ICD-10-CM | POA: Insufficient documentation

## 2021-08-16 DIAGNOSIS — Z8049 Family history of malignant neoplasm of other genital organs: Secondary | ICD-10-CM | POA: Insufficient documentation

## 2021-08-16 DIAGNOSIS — R600 Localized edema: Secondary | ICD-10-CM | POA: Insufficient documentation

## 2021-08-16 DIAGNOSIS — D0511 Intraductal carcinoma in situ of right breast: Secondary | ICD-10-CM | POA: Insufficient documentation

## 2021-08-16 LAB — CMP (CANCER CENTER ONLY)
ALT: 30 U/L (ref 0–44)
AST: 22 U/L (ref 15–41)
Albumin: 4.3 g/dL (ref 3.5–5.0)
Alkaline Phosphatase: 88 U/L (ref 38–126)
Anion gap: 7 (ref 5–15)
BUN: 13 mg/dL (ref 8–23)
CO2: 29 mmol/L (ref 22–32)
Calcium: 9.8 mg/dL (ref 8.9–10.3)
Chloride: 102 mmol/L (ref 98–111)
Creatinine: 0.72 mg/dL (ref 0.44–1.00)
GFR, Estimated: >60 mL/min (ref >60)
Glucose, Bld: 88 mg/dL (ref 70–99)
Potassium: 3.3 mmol/L — ABNORMAL LOW (ref 3.5–5.1)
Sodium: 138 mmol/L (ref 135–145)
Total Bilirubin: 0.9 mg/dL (ref 0.3–1.2)
Total Protein: 7.6 g/dL (ref 6.5–8.1)

## 2021-08-16 LAB — CBC WITH DIFFERENTIAL (CANCER CENTER ONLY)
Abs Immature Granulocytes: 0.01 10*3/uL (ref 0.00–0.07)
Basophils Absolute: 0 10*3/uL (ref 0.0–0.1)
Basophils Relative: 1 %
Eosinophils Absolute: 0.2 10*3/uL (ref 0.0–0.5)
Eosinophils Relative: 4 %
HCT: 34 % — ABNORMAL LOW (ref 36.0–46.0)
Hemoglobin: 11.3 g/dL — ABNORMAL LOW (ref 12.0–15.0)
Immature Granulocytes: 0 %
Lymphocytes Relative: 39 %
Lymphs Abs: 1.8 10*3/uL (ref 0.7–4.0)
MCH: 26.9 pg (ref 26.0–34.0)
MCHC: 33.2 g/dL (ref 30.0–36.0)
MCV: 81 fL (ref 80.0–100.0)
Monocytes Absolute: 0.6 10*3/uL (ref 0.1–1.0)
Monocytes Relative: 12 %
Neutro Abs: 2.1 10*3/uL (ref 1.7–7.7)
Neutrophils Relative %: 44 %
Platelet Count: 265 10*3/uL (ref 150–400)
RBC: 4.2 MIL/uL (ref 3.87–5.11)
RDW: 15.9 % — ABNORMAL HIGH (ref 11.5–15.5)
WBC Count: 4.7 10*3/uL (ref 4.0–10.5)
nRBC: 0 % (ref 0.0–0.2)

## 2021-08-16 MED FILL — Anastrozole Tab 1 MG: ORAL | 90 days supply | Qty: 90 | Fill #0 | Status: AC
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
## 2021-08-17 ENCOUNTER — Telehealth: Payer: Self-pay | Admitting: Hematology and Oncology

## 2021-08-17 ENCOUNTER — Ambulatory Visit: Payer: No Typology Code available for payment source | Attending: Specialist | Admitting: Physical Therapy

## 2021-08-17 ENCOUNTER — Encounter: Payer: Self-pay | Admitting: Physical Therapy

## 2021-08-17 DIAGNOSIS — M6281 Muscle weakness (generalized): Secondary | ICD-10-CM | POA: Diagnosis present

## 2021-08-17 DIAGNOSIS — M25562 Pain in left knee: Secondary | ICD-10-CM | POA: Diagnosis present

## 2021-08-17 DIAGNOSIS — R600 Localized edema: Secondary | ICD-10-CM | POA: Insufficient documentation

## 2021-08-17 DIAGNOSIS — R262 Difficulty in walking, not elsewhere classified: Secondary | ICD-10-CM | POA: Diagnosis present

## 2021-08-17 NOTE — Telephone Encounter (Signed)
Scheduled appointment per 04/18 los. Patient aware.  ?
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
## 2021-08-18 ENCOUNTER — Ambulatory Visit: Payer: No Typology Code available for payment source | Admitting: Physical Therapy

## 2021-08-19 ENCOUNTER — Other Ambulatory Visit (HOSPITAL_COMMUNITY): Payer: Self-pay

## 2021-08-23 ENCOUNTER — Ambulatory Visit: Payer: No Typology Code available for payment source | Attending: Specialist | Admitting: Physical Therapy

## 2021-08-23 ENCOUNTER — Encounter: Payer: Self-pay | Admitting: Physical Therapy

## 2021-08-23 DIAGNOSIS — R600 Localized edema: Secondary | ICD-10-CM | POA: Insufficient documentation

## 2021-08-23 DIAGNOSIS — R262 Difficulty in walking, not elsewhere classified: Secondary | ICD-10-CM | POA: Insufficient documentation

## 2021-08-23 DIAGNOSIS — M25562 Pain in left knee: Secondary | ICD-10-CM | POA: Diagnosis present

## 2021-08-23 DIAGNOSIS — G8929 Other chronic pain: Secondary | ICD-10-CM | POA: Insufficient documentation

## 2021-08-23 DIAGNOSIS — M6281 Muscle weakness (generalized): Secondary | ICD-10-CM | POA: Diagnosis present
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
## 2021-08-25 ENCOUNTER — Ambulatory Visit: Payer: No Typology Code available for payment source | Admitting: Physical Therapy

## 2021-08-25 ENCOUNTER — Encounter: Payer: Self-pay | Admitting: Physical Therapy

## 2021-08-25 DIAGNOSIS — M25562 Pain in left knee: Secondary | ICD-10-CM | POA: Diagnosis not present

## 2021-08-25 DIAGNOSIS — R262 Difficulty in walking, not elsewhere classified: Secondary | ICD-10-CM

## 2021-08-25 DIAGNOSIS — R600 Localized edema: Secondary | ICD-10-CM

## 2021-08-25 DIAGNOSIS — M6281 Muscle weakness (generalized): Secondary | ICD-10-CM

## 2021-08-25 DIAGNOSIS — G8929 Other chronic pain: Secondary | ICD-10-CM
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
## 2021-08-29 ENCOUNTER — Ambulatory Visit: Payer: No Typology Code available for payment source | Admitting: Physical Therapy

## 2021-08-30 ENCOUNTER — Encounter: Payer: Self-pay | Admitting: Physical Therapy

## 2021-08-30 ENCOUNTER — Ambulatory Visit: Payer: No Typology Code available for payment source | Attending: Specialist | Admitting: Physical Therapy

## 2021-08-30 DIAGNOSIS — G8929 Other chronic pain: Secondary | ICD-10-CM | POA: Diagnosis present

## 2021-08-30 DIAGNOSIS — R600 Localized edema: Secondary | ICD-10-CM | POA: Diagnosis present

## 2021-08-30 DIAGNOSIS — M25562 Pain in left knee: Secondary | ICD-10-CM | POA: Diagnosis present

## 2021-08-30 DIAGNOSIS — R262 Difficulty in walking, not elsewhere classified: Secondary | ICD-10-CM | POA: Diagnosis present

## 2021-08-30 DIAGNOSIS — M6281 Muscle weakness (generalized): Secondary | ICD-10-CM | POA: Diagnosis present

## 2021-08-30 DIAGNOSIS — M25662 Stiffness of left knee, not elsewhere classified: Secondary | ICD-10-CM | POA: Diagnosis present
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
## 2021-08-31 ENCOUNTER — Ambulatory Visit: Payer: No Typology Code available for payment source | Admitting: Physical Therapy

## 2021-08-31 ENCOUNTER — Encounter: Payer: Self-pay | Admitting: Physical Therapy

## 2021-08-31 DIAGNOSIS — M25562 Pain in left knee: Secondary | ICD-10-CM | POA: Diagnosis not present

## 2021-08-31 DIAGNOSIS — M6281 Muscle weakness (generalized): Secondary | ICD-10-CM

## 2021-08-31 DIAGNOSIS — R600 Localized edema: Secondary | ICD-10-CM

## 2021-08-31 DIAGNOSIS — R262 Difficulty in walking, not elsewhere classified: Secondary | ICD-10-CM
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
## 2021-09-02 ENCOUNTER — Ambulatory Visit: Payer: No Typology Code available for payment source | Admitting: Physical Therapy

## 2021-09-05 ENCOUNTER — Ambulatory Visit: Payer: No Typology Code available for payment source | Admitting: Physical Therapy

## 2021-09-05 ENCOUNTER — Encounter: Payer: Self-pay | Admitting: Physical Therapy

## 2021-09-05 DIAGNOSIS — M6281 Muscle weakness (generalized): Secondary | ICD-10-CM

## 2021-09-05 DIAGNOSIS — G8929 Other chronic pain: Secondary | ICD-10-CM

## 2021-09-05 DIAGNOSIS — M25562 Pain in left knee: Secondary | ICD-10-CM

## 2021-09-05 DIAGNOSIS — R600 Localized edema: Secondary | ICD-10-CM

## 2021-09-05 DIAGNOSIS — R262 Difficulty in walking, not elsewhere classified: Secondary | ICD-10-CM
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
## 2021-09-07 ENCOUNTER — Encounter: Payer: Self-pay | Admitting: Physical Therapy

## 2021-09-07 ENCOUNTER — Ambulatory Visit: Payer: No Typology Code available for payment source | Admitting: Physical Therapy

## 2021-09-07 DIAGNOSIS — M25562 Pain in left knee: Secondary | ICD-10-CM

## 2021-09-07 DIAGNOSIS — R600 Localized edema: Secondary | ICD-10-CM

## 2021-09-07 DIAGNOSIS — R262 Difficulty in walking, not elsewhere classified: Secondary | ICD-10-CM

## 2021-09-07 DIAGNOSIS — M6281 Muscle weakness (generalized): Secondary | ICD-10-CM
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
## 2021-09-09 ENCOUNTER — Ambulatory Visit: Payer: No Typology Code available for payment source | Admitting: Physical Therapy

## 2021-09-09 ENCOUNTER — Encounter: Payer: Self-pay | Admitting: Physical Therapy

## 2021-09-09 DIAGNOSIS — M6281 Muscle weakness (generalized): Secondary | ICD-10-CM

## 2021-09-09 DIAGNOSIS — R600 Localized edema: Secondary | ICD-10-CM

## 2021-09-09 DIAGNOSIS — M25562 Pain in left knee: Secondary | ICD-10-CM

## 2021-09-09 DIAGNOSIS — R262 Difficulty in walking, not elsewhere classified: Secondary | ICD-10-CM
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
## 2021-09-12 ENCOUNTER — Ambulatory Visit: Payer: No Typology Code available for payment source | Admitting: Physical Therapy

## 2021-09-12 ENCOUNTER — Ambulatory Visit: Payer: No Typology Code available for payment source | Attending: Specialist | Admitting: Physical Therapy

## 2021-09-12 ENCOUNTER — Encounter: Payer: Self-pay | Admitting: Physical Therapy

## 2021-09-12 DIAGNOSIS — M25562 Pain in left knee: Secondary | ICD-10-CM | POA: Insufficient documentation

## 2021-09-12 DIAGNOSIS — M6281 Muscle weakness (generalized): Secondary | ICD-10-CM | POA: Insufficient documentation

## 2021-09-12 DIAGNOSIS — R262 Difficulty in walking, not elsewhere classified: Secondary | ICD-10-CM | POA: Insufficient documentation

## 2021-09-12 DIAGNOSIS — R600 Localized edema: Secondary | ICD-10-CM | POA: Diagnosis present
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
## 2021-09-27 ENCOUNTER — Ambulatory Visit: Payer: No Typology Code available for payment source | Admitting: Physical Therapy
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
## 2021-09-28 ENCOUNTER — Ambulatory Visit: Payer: No Typology Code available for payment source

## 2021-09-28 DIAGNOSIS — G8929 Other chronic pain: Secondary | ICD-10-CM

## 2021-09-28 DIAGNOSIS — M6281 Muscle weakness (generalized): Secondary | ICD-10-CM

## 2021-09-28 DIAGNOSIS — R262 Difficulty in walking, not elsewhere classified: Secondary | ICD-10-CM

## 2021-09-28 DIAGNOSIS — M25662 Stiffness of left knee, not elsewhere classified: Secondary | ICD-10-CM

## 2021-09-28 DIAGNOSIS — M25562 Pain in left knee: Secondary | ICD-10-CM | POA: Diagnosis not present

## 2021-10-03 ENCOUNTER — Ambulatory Visit: Payer: No Typology Code available for payment source

## 2021-10-04 ENCOUNTER — Encounter: Payer: Self-pay | Admitting: Family

## 2021-10-04 ENCOUNTER — Ambulatory Visit (HOSPITAL_BASED_OUTPATIENT_CLINIC_OR_DEPARTMENT_OTHER)
Admission: RE | Admit: 2021-10-04 | Discharge: 2021-10-04 | Disposition: A | Discharge status: Home / Self Care | Payer: PPO | Source: Ambulatory Visit | Attending: Family | Admitting: Family

## 2021-10-04 ENCOUNTER — Ambulatory Visit: Payer: PPO | Admitting: Family

## 2021-10-04 ENCOUNTER — Other Ambulatory Visit: Payer: Self-pay | Admitting: *Deleted

## 2021-10-04 VITALS — BP 110/70 | HR 78 | Temp 97.7°F | Resp 12 | Ht 65.0 in | Wt 179.2 lb

## 2021-10-04 DIAGNOSIS — J3089 Other allergic rhinitis: Secondary | ICD-10-CM

## 2021-10-04 DIAGNOSIS — J4530 Mild persistent asthma, uncomplicated: Secondary | ICD-10-CM

## 2021-10-04 DIAGNOSIS — R058 Other specified cough: Secondary | ICD-10-CM

## 2021-10-04 DIAGNOSIS — J302 Other seasonal allergic rhinitis: Secondary | ICD-10-CM

## 2021-10-04 DIAGNOSIS — K219 Gastro-esophageal reflux disease without esophagitis: Secondary | ICD-10-CM | POA: Diagnosis not present

## 2021-10-04 MED ORDER — DOXYCYCLINE HYCLATE 100 MG PO TABS: 100.0000 mg | ORAL_TABLET | Freq: Two times a day (BID) | ORAL | 0 refills | Status: AC

## 2021-10-04 MED ORDER — ALVESCO 80 MCG/ACT IN AERS: INHALATION_SPRAY | RESPIRATORY_TRACT | 1 refills | Status: DC

## 2021-10-04 NOTE — Patient Instructions (Addendum)
Asthma Continue montelukast 10 mg once a day to prevent cough or wheeze  Continue albuterol 2 puffs every 4 hours as needed for cough or wheeze OR Instead use albuterol 0.083% solution via nebulizer one unit vial every 4 hours as needed for cough or wheeze For asthma flare,  and NOW begin Alvesco 80-2 puffs twice a day for 2 weeks or until cough and wheeze free.  We will get a STAT chest x-ray. We will call you with results once they are back  Allergic rhinitis-history of Eustachian tube dysfunction Continue allergen avoidance measures directed toward grass pollen, weed pollen, tree pollen, and dust mite  Continue loratadine 10 mg once a day as needed for runny nose or itch.  You may take an additional 10 mg tablet for breakthrough symptoms Continue Flonase nasal spray 2 sprays in each nostril once a day.In the right nostril, point the applicator out toward the right ear. In the left nostril, point the applicator out toward the left ear Continue azelastine 1 to 2 sprays in each nostril twice a day as needed for runny nose. Use this more consistently while sick.  Ok to use Flonase and azelastine together Consider saline nasal rinses or saline mist as needed for nasal symptoms. Use this before any medicated nasal sprays for best result Continue Mucinex as needed. Make sure to drink plenty of fluids  Reflux Continue dietary and lifestyle modifications as listed below Continue pantoprazole once a day as previously prescribed We will refer you to GI since you continue to have heartburn and reflux symptoms while on pantoprazole  Call the clinic if this treatment plan is not working well for you  Follow up in 3 months or sooner if needed.

## 2021-10-04 NOTE — Progress Notes (Signed)
[place] [id number] Dept: [phone]  FOLLOW UP NOTE  Patient ID: Rachael Osborn, female    DOB: Jun 22, 1955  Age: 66 y.o. MRN: [id number] Date of Office Visit: 10/04/2021  Assessment  Chief Complaint: Allergic Rhinitis , Asthma (Coughing since last tuesday), and Burning Eyes (Runny eyes as well)  HPI Rachael Osborn is a 66 year old female who presents today for an acute visit.  She was last seen on June 13, 2021 by Dr. Simona Huh for asthma, seasonal and perennial allergic rhinitis, and gastroesophageal reflux disease.  Since her last office visit she denies any new diagnosis or surgeries.  Her older sister did die on Sunday and she is planning on going out of town tomorrow.  She reports on Sep 21 2021 she received the [place] booster and her husband came home with a cold and coughing.  She then started having symptoms Tuesday or Wednesday of last week.  She has not tested for COVID-19.  Asthma is reported as not controlled with montelukast 10 mg once a day and albuterol as needed.  She has not started Alvesco 80 mcg 2 puffs twice a day for 2 weeks with asthma flares.  She reports that she forgot to start this medication.  Yesterday she started feeling a rippling affect in her back and is concerned that there is some kind of infection.  She also has a productive cough that is not clear but may be has a yellow tinge.  The productive cough has been going on since Thursday or Friday of last week.  She also has tightness in her chest that started Monday, wheezing, a little bit of shortness of breath, and nocturnal awakenings due to breathing problems.  She denies fever and chills.  She has also been taking Tessalon Perles and Mucinex.  Allergic rhinitis is reported as not well controlled with loratadine 10 mg once a day, Flonase nasal spray as needed, and azelastine nasal spray as needed.  She does not like to use saline nasal rinses.  She reports postnasal drip, voice changes,  clear rhinorrhea, nasal congestion, and headaches.  She has not had any sinus infections since we last saw her.  She also reports itchy, watery/burning eyes.  Reflux is reported as not well controlled with pantoprazole once a day.  She reports heartburn and reflux symptoms.  She has not seen her GI doctor since having an endoscopy or colonoscopy 2 to 3 years ago.  She is interested in being referred     Drug Allergies:  Allergies  Allergen Reactions   Robaxin [Methocarbamol] Rash   Tizanidine Other (See Comments)   Zyrtec [Cetirizine] Itching    Review of Systems: Review of Systems  Constitutional:  Negative for chills and fever.  HENT:         Reports clear rhinorrhea, voice changing, postnasal drip, nasal congestion, and headache  Eyes:        Reports itchy, watering, burning eyes  Respiratory:  Positive for cough, shortness of breath and wheezing.        Reports productive cough with sputum that is not clear and maybe has a little bit of yellow tinge that started Thursday or Friday.  Also reports tightness in her chest that started Monday, wheezing, nocturnal awakenings due to breathing problems and a little bit of shortness of breath.   Cardiovascular:  Negative for chest pain and palpitations.  Gastrointestinal:        Reports heartburn and reflux symptoms that are  not controlled with pantoprazole once a day.  Genitourinary:  Positive for frequency.       Reports increased frequency of urination due to being on hydrochlorothiazide  Skin:  Negative for itching and rash.  Neurological:  Positive for headaches.  Endo/Heme/Allergies:  Positive for environmental allergies.    Physical Exam: BP 110/70 (BP Location: Right Arm, Patient Position: Sitting, Cuff Size: Normal)   Pulse 78   Temp 97.7 F (36.5 C) (Temporal)   Resp 12   Ht '5\' 5"'$  (1.651 m)   Wt 179 lb 3.2 oz (81.3 kg)   SpO2 98%   BMI 29.82 kg/m    Physical Exam Constitutional:      Appearance: Normal appearance.   HENT:     Head: Normocephalic and atraumatic.     Comments: Pharynx normal, eyes normal, ears normal, nose: Bilateral lower turbinates moderately edematous and slightly erythematous with clear drainage noted    Right Ear: Tympanic membrane, ear canal and external ear normal.     Left Ear: Tympanic membrane, ear canal and external ear normal.     Mouth/Throat:     Mouth: Mucous membranes are moist.     Pharynx: Oropharynx is clear.  Eyes:     Conjunctiva/sclera: Conjunctivae normal.  Cardiovascular:     Rate and Rhythm: Normal rate and regular rhythm.     Heart sounds: Normal heart sounds.  Pulmonary:     Effort: Pulmonary effort is normal.     Breath sounds: Normal breath sounds.     Comments: Lungs clear to auscultation Musculoskeletal:     Cervical back: Neck supple.  Skin:    General: Skin is warm.  Neurological:     Mental Status: She is alert and oriented to person, place, and time.  Psychiatric:        Mood and Affect: Mood normal.        Behavior: Behavior normal.        Thought Content: Thought content normal.        Judgment: Judgment normal.    Diagnostics: FVC 2.33 L (89%), FEV1 2.00 L (98%).  Predicted FVC 2.61 L, predicted FEV1 2.04 L.  Spirometry indicates normal respiratory function.  Assessment and Plan: 1. Mild persistent asthma without complication   2. Productive cough   3. Seasonal and perennial allergic rhinitis   4. Gastroesophageal reflux disease, unspecified whether esophagitis present     Meds ordered this encounter  Medications   ciclesonide (ALVESCO) 80 MCG/ACT inhaler    Sig: For asthma flare/upper respiratory infections start Alvesco taking 2 puffs twice a day with spacer for 2 weeks or until symptoms return to baseline.  Rinse mouth out afterwards.    Dispense:  3 each    Refill:  1    Please dispense 90 day supply    Patient Instructions  Asthma Continue montelukast 10 mg once a day to prevent cough or wheeze  Continue albuterol 2  puffs every 4 hours as needed for cough or wheeze OR Instead use albuterol 0.083% solution via nebulizer one unit vial every 4 hours as needed for cough or wheeze For asthma flare,  and NOW begin Alvesco 80-2 puffs twice a day for 2 weeks or until cough and wheeze free.  We will get a STAT chest x-ray. We will call you with results once they are back  Allergic rhinitis-history of Eustachian tube dysfunction Continue allergen avoidance measures directed toward grass pollen, weed pollen, tree pollen, and dust mite  Continue loratadine 10  mg once a day as needed for runny nose or itch.  You may take an additional 10 mg tablet for breakthrough symptoms Continue Flonase nasal spray 2 sprays in each nostril once a day.In the right nostril, point the applicator out toward the right ear. In the left nostril, point the applicator out toward the left ear Continue azelastine 1 to 2 sprays in each nostril twice a day as needed for runny nose. Use this more consistently while sick.  Ok to use Flonase and azelastine together Consider saline nasal rinses or saline mist as needed for nasal symptoms. Use this before any medicated nasal sprays for best result Continue Mucinex as needed. Make sure to drink plenty of fluids  Reflux Continue dietary and lifestyle modifications as listed below Continue pantoprazole once a day as previously prescribed We will refer you to GI since you continue to have heartburn and reflux symptoms while on pantoprazole  Call the clinic if this treatment plan is not working well for you  Follow up in 3 months or sooner if needed.   Return in about 3 months (around 01/04/2022), or if symptoms worsen or fail to improve.    Thank you for the opportunity to care for this patient.  Please do not hesitate to contact me with questions.  Althea Charon, FNP [facility]

## 2021-10-04 NOTE — Telephone Encounter (Signed)
Refer to result note 

## 2021-10-04 NOTE — Progress Notes (Signed)
Please send in doxycycline 100 mg taking 1 tablet twice a day for 7 days. Quantity 14 tablets with no refills. This medication is to have on hand since she is going out of town. Do not start now. Only take if symptoms worsen.

## 2021-10-04 NOTE — Progress Notes (Signed)
Please let Rachael Osborn know that her chest x-ray is normal. This is good news.  Please send in a prescription for Augmentin 875 mg taking 1 tablet twice a day for 7 days. Quantity 14 with no refills. This is for her to have on hand since she is going out of town. Do not start Augmentin unless your symptoms worsen or persist.

## 2021-10-05 ENCOUNTER — Encounter: Payer: Self-pay | Admitting: Physical Therapy

## 2021-10-05 ENCOUNTER — Ambulatory Visit: Payer: No Typology Code available for payment source | Attending: Specialist | Admitting: Physical Therapy

## 2021-10-05 DIAGNOSIS — R262 Difficulty in walking, not elsewhere classified: Secondary | ICD-10-CM | POA: Diagnosis present

## 2021-10-05 DIAGNOSIS — M6281 Muscle weakness (generalized): Secondary | ICD-10-CM | POA: Insufficient documentation

## 2021-10-05 DIAGNOSIS — G8929 Other chronic pain: Secondary | ICD-10-CM | POA: Insufficient documentation

## 2021-10-05 DIAGNOSIS — M25562 Pain in left knee: Secondary | ICD-10-CM | POA: Insufficient documentation

## 2021-10-05 DIAGNOSIS — M25662 Stiffness of left knee, not elsewhere classified: Secondary | ICD-10-CM | POA: Diagnosis present
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
## 2021-10-07 ENCOUNTER — Telehealth: Payer: Self-pay

## 2021-10-07 ENCOUNTER — Other Ambulatory Visit: Payer: Self-pay

## 2021-10-07 NOTE — Telephone Encounter (Signed)
Pa submitted thru cover my meds and approved informed pharmacy

## 2021-10-07 NOTE — Telephone Encounter (Signed)
PA submitted thru cover my meds for [facility] waiting on response from insurance

## 2021-10-10 ENCOUNTER — Encounter: Payer: Self-pay | Admitting: Physical Therapy

## 2021-10-10 ENCOUNTER — Ambulatory Visit: Payer: No Typology Code available for payment source | Admitting: Physical Therapy

## 2021-10-10 DIAGNOSIS — M25562 Pain in left knee: Secondary | ICD-10-CM | POA: Diagnosis not present

## 2021-10-10 DIAGNOSIS — R262 Difficulty in walking, not elsewhere classified: Secondary | ICD-10-CM

## 2021-10-10 DIAGNOSIS — M25662 Stiffness of left knee, not elsewhere classified: Secondary | ICD-10-CM

## 2021-10-10 DIAGNOSIS — G8929 Other chronic pain: Secondary | ICD-10-CM

## 2021-10-10 DIAGNOSIS — M6281 Muscle weakness (generalized): Secondary | ICD-10-CM
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
## 2021-10-14 ENCOUNTER — Ambulatory Visit: Payer: No Typology Code available for payment source | Admitting: Physical Therapy

## 2021-10-14 ENCOUNTER — Encounter: Payer: Self-pay | Admitting: Physical Therapy

## 2021-10-14 ENCOUNTER — Telehealth: Payer: Self-pay | Admitting: Family

## 2021-10-14 DIAGNOSIS — M25562 Pain in left knee: Secondary | ICD-10-CM | POA: Diagnosis not present

## 2021-10-14 DIAGNOSIS — M25662 Stiffness of left knee, not elsewhere classified: Secondary | ICD-10-CM

## 2021-10-14 DIAGNOSIS — G8929 Other chronic pain: Secondary | ICD-10-CM

## 2021-10-14 DIAGNOSIS — M6281 Muscle weakness (generalized): Secondary | ICD-10-CM

## 2021-10-14 DIAGNOSIS — R262 Difficulty in walking, not elsewhere classified: Secondary | ICD-10-CM

## 2021-10-14 NOTE — Telephone Encounter (Signed)
-----   Message from Althea Charon, [place] sent at 10/04/2021 12:52 PM EDT ----- Please refer to GI due to poorly controlled heartburn and reflux while on pantoprazole

## 2021-10-14 NOTE — Telephone Encounter (Signed)
Spoke to patient in regards to referral. Looks like patient was seen by a GI doctor about 3 years ago. Patient stated she had an endoscopy done. Asked patient if she wanted to see the same GI doctor. Patient stated she could not remember who it was but that it was someone in [place]. I let pt know I would contact her again once referral was placed.   Patient verbalized understanding.
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
## 2021-10-18 ENCOUNTER — Encounter: Payer: Self-pay | Admitting: Physical Therapy

## 2021-10-18 ENCOUNTER — Ambulatory Visit: Payer: No Typology Code available for payment source | Admitting: Physical Therapy

## 2021-10-18 DIAGNOSIS — M25562 Pain in left knee: Secondary | ICD-10-CM | POA: Diagnosis not present

## 2021-10-18 DIAGNOSIS — R262 Difficulty in walking, not elsewhere classified: Secondary | ICD-10-CM

## 2021-10-18 DIAGNOSIS — G8929 Other chronic pain: Secondary | ICD-10-CM

## 2021-10-18 DIAGNOSIS — M25662 Stiffness of left knee, not elsewhere classified: Secondary | ICD-10-CM

## 2021-10-18 DIAGNOSIS — M6281 Muscle weakness (generalized): Secondary | ICD-10-CM
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
## 2021-10-18 NOTE — Telephone Encounter (Signed)
Thank you :)

## 2021-10-20 ENCOUNTER — Ambulatory Visit: Payer: No Typology Code available for payment source | Admitting: Physical Therapy

## 2021-10-20 ENCOUNTER — Other Ambulatory Visit (HOSPITAL_COMMUNITY): Payer: Self-pay

## 2021-10-20 DIAGNOSIS — M25562 Pain in left knee: Secondary | ICD-10-CM | POA: Diagnosis not present

## 2021-10-20 DIAGNOSIS — G8929 Other chronic pain: Secondary | ICD-10-CM

## 2021-10-20 DIAGNOSIS — M6281 Muscle weakness (generalized): Secondary | ICD-10-CM

## 2021-10-20 DIAGNOSIS — R262 Difficulty in walking, not elsewhere classified: Secondary | ICD-10-CM

## 2021-10-20 MED ORDER — PREDNISOLONE ACETATE 1 % OP SUSP
OPHTHALMIC | 1 refills | Status: AC
  Filled 2021-10-20: qty 5, 20d supply, fill #0

## 2021-10-20 MED ORDER — MOXIFLOXACIN HCL 0.5 % OP SOLN
OPHTHALMIC | 1 refills | Status: AC
  Filled 2021-10-20: qty 6, 24d supply, fill #0
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
## 2021-10-21 ENCOUNTER — Other Ambulatory Visit (HOSPITAL_COMMUNITY): Payer: Self-pay

## 2021-10-24 ENCOUNTER — Other Ambulatory Visit (HOSPITAL_COMMUNITY): Payer: Self-pay

## 2021-10-25 ENCOUNTER — Ambulatory Visit: Payer: No Typology Code available for payment source | Admitting: Physical Therapy

## 2021-10-26 ENCOUNTER — Ambulatory Visit: Payer: No Typology Code available for payment source | Admitting: Physical Therapy

## 2021-10-26 DIAGNOSIS — M6281 Muscle weakness (generalized): Secondary | ICD-10-CM

## 2021-10-26 DIAGNOSIS — G8929 Other chronic pain: Secondary | ICD-10-CM

## 2021-10-26 DIAGNOSIS — M25562 Pain in left knee: Secondary | ICD-10-CM | POA: Diagnosis not present

## 2021-10-26 DIAGNOSIS — R262 Difficulty in walking, not elsewhere classified: Secondary | ICD-10-CM
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
## 2021-10-27 ENCOUNTER — Ambulatory Visit: Payer: No Typology Code available for payment source | Admitting: Physical Therapy

## 2021-11-02 ENCOUNTER — Ambulatory Visit: Payer: No Typology Code available for payment source | Admitting: Physical Therapy

## 2021-11-04 ENCOUNTER — Encounter: Payer: Self-pay | Admitting: Physical Therapy

## 2021-11-04 ENCOUNTER — Ambulatory Visit: Payer: No Typology Code available for payment source | Attending: Specialist | Admitting: Physical Therapy

## 2021-11-04 DIAGNOSIS — R600 Localized edema: Secondary | ICD-10-CM | POA: Insufficient documentation

## 2021-11-04 DIAGNOSIS — R262 Difficulty in walking, not elsewhere classified: Secondary | ICD-10-CM | POA: Insufficient documentation

## 2021-11-04 DIAGNOSIS — G8929 Other chronic pain: Secondary | ICD-10-CM | POA: Insufficient documentation

## 2021-11-04 DIAGNOSIS — M25562 Pain in left knee: Secondary | ICD-10-CM | POA: Diagnosis present

## 2021-11-04 DIAGNOSIS — M6281 Muscle weakness (generalized): Secondary | ICD-10-CM | POA: Insufficient documentation

## 2021-11-04 DIAGNOSIS — M25661 Stiffness of right knee, not elsewhere classified: Secondary | ICD-10-CM | POA: Insufficient documentation

## 2021-11-04 DIAGNOSIS — M25662 Stiffness of left knee, not elsewhere classified: Secondary | ICD-10-CM | POA: Insufficient documentation
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
## 2021-11-07 ENCOUNTER — Ambulatory Visit: Payer: No Typology Code available for payment source

## 2021-11-07 DIAGNOSIS — G8929 Other chronic pain: Secondary | ICD-10-CM

## 2021-11-07 DIAGNOSIS — R262 Difficulty in walking, not elsewhere classified: Secondary | ICD-10-CM

## 2021-11-07 DIAGNOSIS — M6281 Muscle weakness (generalized): Secondary | ICD-10-CM

## 2021-11-07 DIAGNOSIS — M25662 Stiffness of left knee, not elsewhere classified: Secondary | ICD-10-CM

## 2021-11-07 DIAGNOSIS — M25562 Pain in left knee: Secondary | ICD-10-CM | POA: Diagnosis not present
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
## 2021-11-10 ENCOUNTER — Ambulatory Visit: Payer: No Typology Code available for payment source

## 2021-11-10 DIAGNOSIS — R262 Difficulty in walking, not elsewhere classified: Secondary | ICD-10-CM

## 2021-11-10 DIAGNOSIS — M6281 Muscle weakness (generalized): Secondary | ICD-10-CM

## 2021-11-10 DIAGNOSIS — M25562 Pain in left knee: Secondary | ICD-10-CM

## 2021-11-10 DIAGNOSIS — M25662 Stiffness of left knee, not elsewhere classified: Secondary | ICD-10-CM

## 2021-11-10 DIAGNOSIS — G8929 Other chronic pain: Secondary | ICD-10-CM
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
## 2021-11-14 ENCOUNTER — Encounter: Payer: Self-pay | Admitting: Physical Therapy

## 2021-11-14 ENCOUNTER — Ambulatory Visit: Payer: No Typology Code available for payment source | Admitting: Physical Therapy

## 2021-11-14 DIAGNOSIS — M25661 Stiffness of right knee, not elsewhere classified: Secondary | ICD-10-CM

## 2021-11-14 DIAGNOSIS — M6281 Muscle weakness (generalized): Secondary | ICD-10-CM

## 2021-11-14 DIAGNOSIS — R262 Difficulty in walking, not elsewhere classified: Secondary | ICD-10-CM

## 2021-11-14 DIAGNOSIS — M25662 Stiffness of left knee, not elsewhere classified: Secondary | ICD-10-CM

## 2021-11-14 DIAGNOSIS — M25562 Pain in left knee: Secondary | ICD-10-CM

## 2021-11-14 DIAGNOSIS — G8929 Other chronic pain: Secondary | ICD-10-CM

## 2021-11-14 DIAGNOSIS — R600 Localized edema: Secondary | ICD-10-CM
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
## 2021-11-15 ENCOUNTER — Other Ambulatory Visit: Payer: Self-pay | Admitting: Hematology and Oncology

## 2021-11-15 ENCOUNTER — Other Ambulatory Visit (HOSPITAL_COMMUNITY): Payer: Self-pay

## 2021-11-15 ENCOUNTER — Telehealth: Payer: Self-pay

## 2021-11-15 ENCOUNTER — Ambulatory Visit: Payer: No Typology Code available for payment source | Admitting: Physical Therapy

## 2021-11-15 DIAGNOSIS — D0511 Intraductal carcinoma in situ of right breast: Secondary | ICD-10-CM

## 2021-11-15 MED ORDER — PREDNISOLONE ACETATE 1 % OP SUSP: OPHTHALMIC | 1 refills | Status: DC

## 2021-11-15 MED ORDER — MOXIFLOXACIN HCL 0.5 % OP SOLN: OPHTHALMIC | 1 refills | Status: AC

## 2021-11-15 NOTE — Telephone Encounter (Signed)
Attempted to call pt to find out if she is still taking Anastrozole. Rx refill request was sent, but states the medication was discontinues by a CMA from another office. Pt did not answer; LVM for call back to clarify.

## 2021-11-16 ENCOUNTER — Other Ambulatory Visit: Payer: Self-pay | Admitting: *Deleted

## 2021-11-16 MED ORDER — ANASTROZOLE 1 MG PO TABS: 1.0000 mg | ORAL_TABLET | Freq: Every day | ORAL | 4 refills | Status: DC

## 2021-11-17 NOTE — Telephone Encounter (Signed)
Refilled and signed yesterday. Gardiner Rhyme, RN

## 2021-11-21 ENCOUNTER — Other Ambulatory Visit (HOSPITAL_COMMUNITY): Payer: Self-pay
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
## 2021-12-12 ENCOUNTER — Encounter: Payer: Self-pay | Admitting: Internal Medicine

## 2021-12-12 ENCOUNTER — Ambulatory Visit (INDEPENDENT_AMBULATORY_CARE_PROVIDER_SITE_OTHER): Payer: PPO | Admitting: Internal Medicine

## 2021-12-12 VITALS — BP 90/60 | HR 84 | Temp 98.1°F | Resp 18

## 2021-12-12 DIAGNOSIS — J302 Other seasonal allergic rhinitis: Secondary | ICD-10-CM

## 2021-12-12 DIAGNOSIS — K219 Gastro-esophageal reflux disease without esophagitis: Secondary | ICD-10-CM | POA: Diagnosis not present

## 2021-12-12 DIAGNOSIS — J3089 Other allergic rhinitis: Secondary | ICD-10-CM

## 2021-12-12 DIAGNOSIS — J4530 Mild persistent asthma, uncomplicated: Secondary | ICD-10-CM

## 2021-12-12 MED ORDER — MONTELUKAST SODIUM 10 MG PO TABS: 10.0000 mg | ORAL_TABLET | Freq: Every day | ORAL | 5 refills | Status: DC

## 2021-12-12 NOTE — Patient Instructions (Addendum)
Asthma- Continue montelukast 10 mg once a day to prevent cough or wheeze  Continue albuterol 2 puffs every 4 hours as needed for cough or wheeze OR Instead use albuterol 0.083% solution via nebulizer one unit vial every 4 hours as needed for cough or wheeze  For heavy sensation in back, try using your albuterol for this symptom.  If this improves your symptoms, start your Alvesco and use 2 puffs twice a day with spacer for 6 weeks.  For asthma flare, and Alvesco 80-2 puffs twice a day for 2 weeks or until cough and wheeze free.   Allergic rhinitis- Continue allergen avoidance measures directed toward grass pollen, weed pollen, tree pollen, and dust mite  Continue loratadine 10 mg once a day as needed  Continue Flonase nasal spray 2 sprays in each nostril once a day Continue azelastine 1 to 2 sprays in each nostril twice a day as needed  Consider saline nasal rinses or saline mist as needed for nasal symptoms. Use this before any medicated nasal sprays for best result Continue Mucinex as needed. Make sure to drink plenty of fluids  Reflux Continue dietary and lifestyle modifications as listed below Continue pantoprazole once a day as previously prescribed Continue work-up with GI as scheduled.  Call the clinic if this treatment plan is not working well for you  Follow up in 6 months or sooner if needed. It was a pleasure seeing you today.  Sigurd Sos, MD [facility]

## 2022-01-04 ENCOUNTER — Ambulatory Visit: Payer: PPO | Admitting: Internal Medicine

## 2022-01-19 ENCOUNTER — Other Ambulatory Visit: Payer: PPO

## 2022-02-03 ENCOUNTER — Encounter: Payer: Self-pay | Admitting: Hematology and Oncology

## 2022-02-16 ENCOUNTER — Ambulatory Visit: Payer: PPO | Admitting: Hematology and Oncology

## 2022-03-02 ENCOUNTER — Ambulatory Visit: Payer: PPO | Admitting: Hematology and Oncology

## 2022-03-03 IMAGING — DX DG KNEE 1-2V*L*
2 series · 2 of 2 positions shown · non-contrast
Comparison: MR 11/04/2019 by report only

CLINICAL DATA: Post knee arthroplasty

EXAM:
LEFT KNEE - 1-2 VIEW

[knee ap]
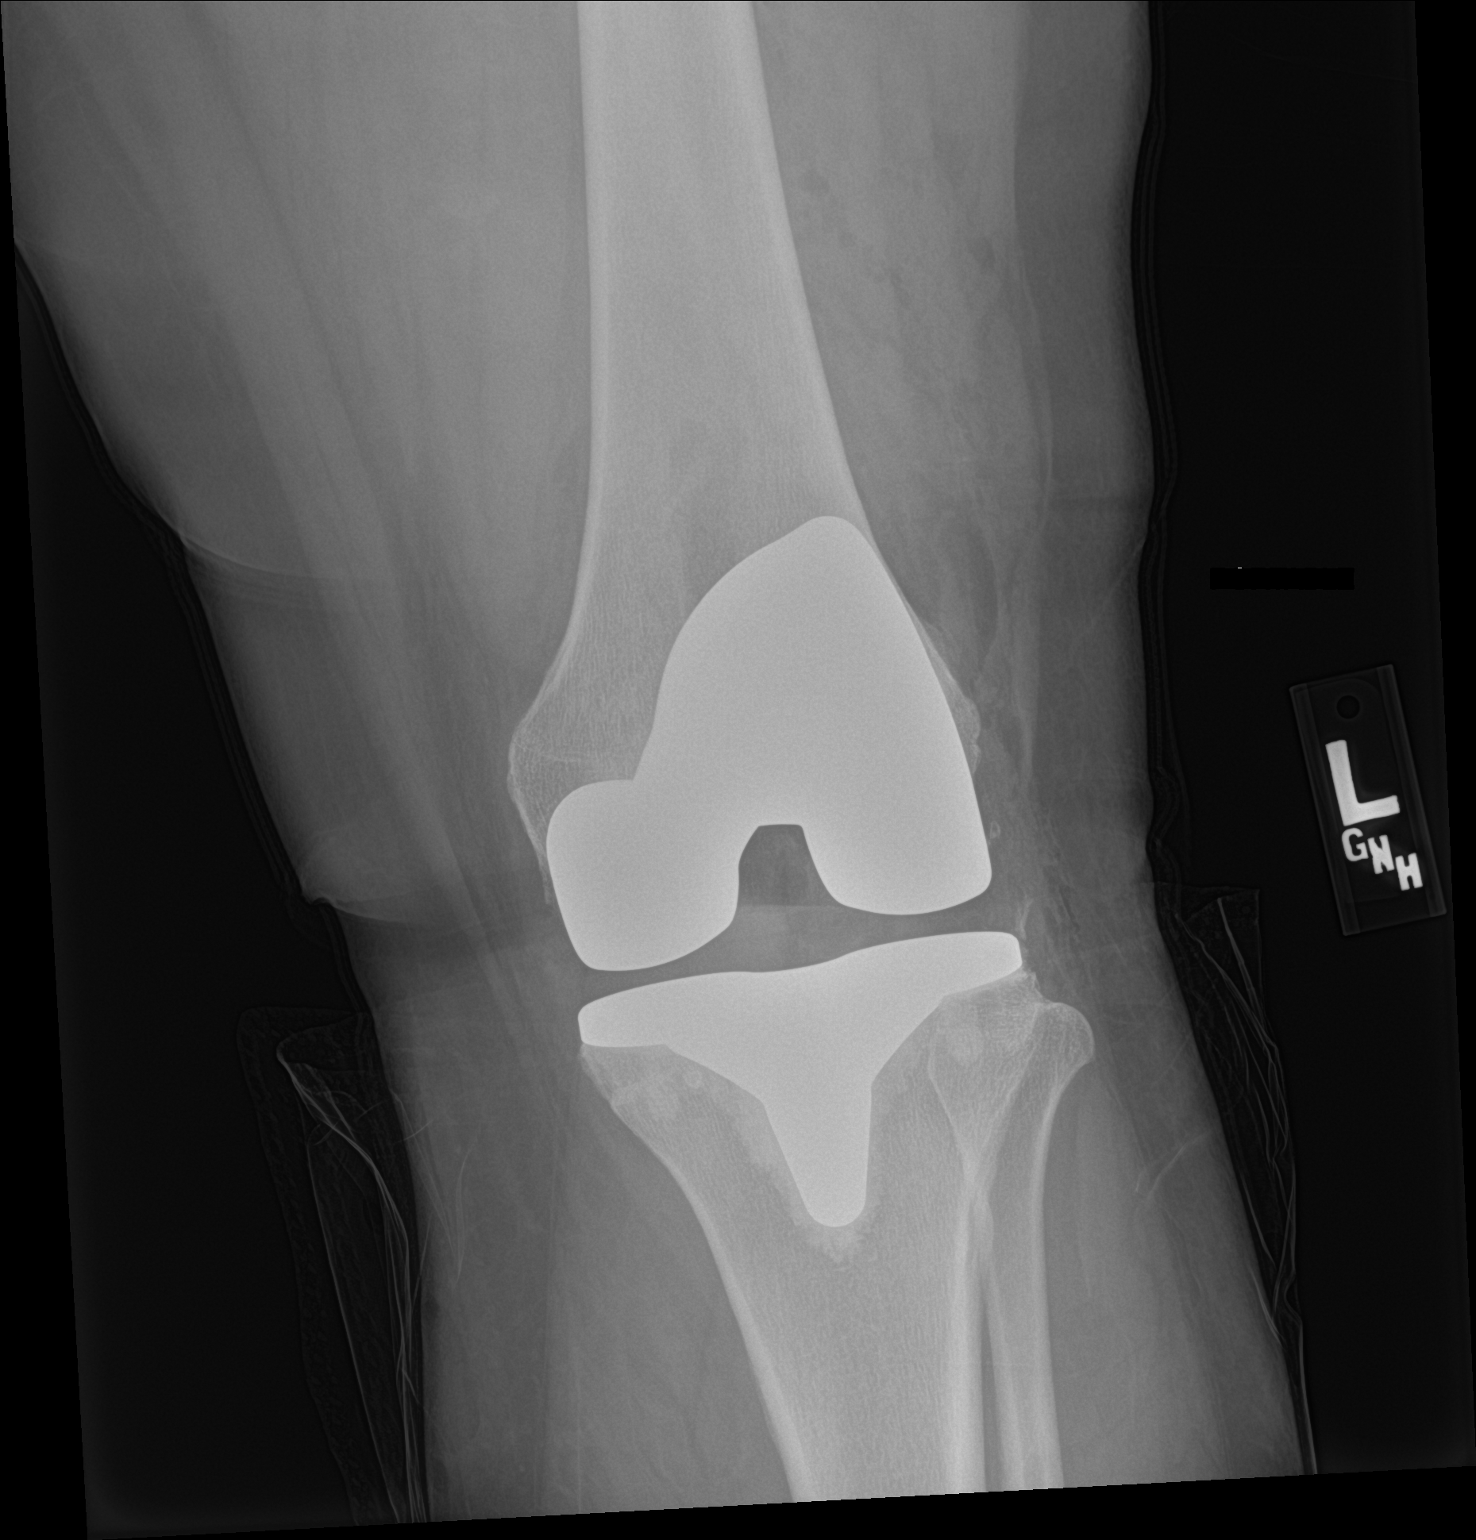

[knee lat]
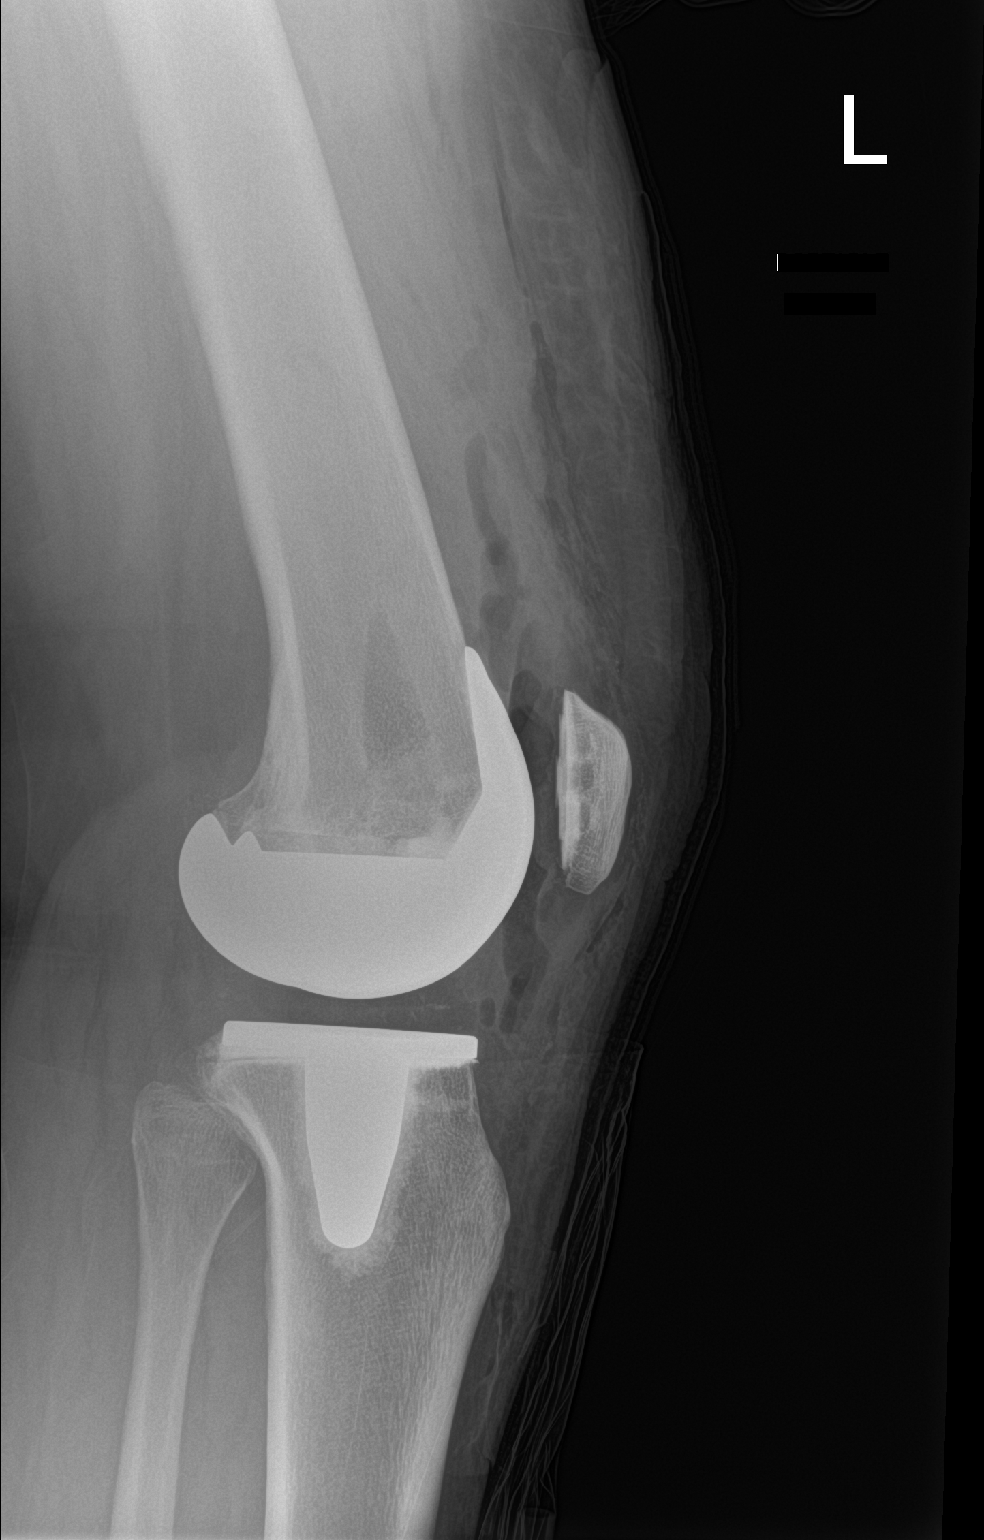

[2 of 2 positions shown; findings below may reference images not displayed]

FINDINGS: Components of left knee arthroplasty project in expected location.
No fracture or dislocation. Postop changes in the overlying
subcutaneous tissues.
IMPRESSION: Left knee arthroplasty without apparent complication.

## 2022-03-09 ENCOUNTER — Other Ambulatory Visit: Payer: Self-pay

## 2022-03-09 ENCOUNTER — Inpatient Hospital Stay: Payer: PPO | Attending: Hematology and Oncology | Admitting: Hematology and Oncology

## 2022-03-09 VITALS — BP 139/83 | HR 95 | Temp 97.7°F | Resp 16 | Ht 65.0 in | Wt 178.4 lb

## 2022-03-09 DIAGNOSIS — Z803 Family history of malignant neoplasm of breast: Secondary | ICD-10-CM | POA: Diagnosis not present

## 2022-03-09 DIAGNOSIS — Z800 Family history of malignant neoplasm of digestive organs: Secondary | ICD-10-CM | POA: Diagnosis not present

## 2022-03-09 DIAGNOSIS — Z8049 Family history of malignant neoplasm of other genital organs: Secondary | ICD-10-CM | POA: Insufficient documentation

## 2022-03-09 DIAGNOSIS — Z13820 Encounter for screening for osteoporosis: Secondary | ICD-10-CM

## 2022-03-09 DIAGNOSIS — D0511 Intraductal carcinoma in situ of right breast: Secondary | ICD-10-CM | POA: Diagnosis not present

## 2022-03-09 DIAGNOSIS — Z923 Personal history of irradiation: Secondary | ICD-10-CM | POA: Diagnosis not present

## 2022-03-09 DIAGNOSIS — Z79811 Long term (current) use of aromatase inhibitors: Secondary | ICD-10-CM | POA: Diagnosis not present
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
## 2022-05-09 ENCOUNTER — Telehealth: Payer: Self-pay

## 2022-05-09 ENCOUNTER — Encounter: Payer: Self-pay | Admitting: Internal Medicine

## 2022-05-09 ENCOUNTER — Ambulatory Visit (INDEPENDENT_AMBULATORY_CARE_PROVIDER_SITE_OTHER): Payer: PPO | Admitting: Internal Medicine

## 2022-05-09 VITALS — BP 112/82 | HR 78 | Temp 97.6°F | Resp 17 | Wt 180.9 lb

## 2022-05-09 DIAGNOSIS — J4520 Mild intermittent asthma, uncomplicated: Secondary | ICD-10-CM

## 2022-05-09 DIAGNOSIS — J302 Other seasonal allergic rhinitis: Secondary | ICD-10-CM

## 2022-05-09 DIAGNOSIS — K219 Gastro-esophageal reflux disease without esophagitis: Secondary | ICD-10-CM | POA: Diagnosis not present

## 2022-05-09 DIAGNOSIS — H1045 Other chronic allergic conjunctivitis: Secondary | ICD-10-CM

## 2022-05-09 DIAGNOSIS — J3089 Other allergic rhinitis: Secondary | ICD-10-CM

## 2022-05-09 MED ORDER — MONTELUKAST SODIUM 10 MG PO TABS: 10.0000 mg | ORAL_TABLET | Freq: Every day | ORAL | 5 refills | Status: DC

## 2022-05-09 MED ORDER — FLUTICASONE-SALMETEROL 100-50 MCG/ACT IN AEPB: 1.0000 | INHALATION_SPRAY | Freq: Two times a day (BID) | RESPIRATORY_TRACT | 1 refills | Status: DC

## 2022-05-09 MED ORDER — AMOXICILLIN-POT CLAVULANATE 875-125 MG PO TABS: 1.0000 | ORAL_TABLET | Freq: Two times a day (BID) | ORAL | 0 refills | Status: AC

## 2022-05-09 MED ORDER — LORATADINE 10 MG PO TABS: 10.0000 mg | ORAL_TABLET | Freq: Every day | ORAL | 5 refills | Status: DC

## 2022-05-09 MED ORDER — AZELASTINE HCL 0.1 % NA SOLN: 2.0000 | Freq: Two times a day (BID) | NASAL | 1 refills | Status: DC

## 2022-05-09 MED ORDER — ALVESCO 80 MCG/ACT IN AERS: INHALATION_SPRAY | RESPIRATORY_TRACT | 1 refills | Status: DC

## 2022-05-09 MED ORDER — ALBUTEROL SULFATE HFA 108 (90 BASE) MCG/ACT IN AERS: 2.0000 | INHALATION_SPRAY | RESPIRATORY_TRACT | 2 refills | Status: DC | PRN

## 2022-05-09 NOTE — Telephone Encounter (Signed)
Changes to advair which looks like it is covered.  Prescription already placed

## 2022-05-09 NOTE — Progress Notes (Signed)
Follow Up Note  RE: Rachael Osborn MRN: [id number] DOB: Dec 13, 1955 Date of Office Visit: 05/09/2022  Referring provider: [name], DO Primary care provider: [name], DO  Chief Complaint: Asthma, Follow-up (Pt states that she's been having some sinus issues, ), Headache, and Sinus Problem  History of Present Illness: I had the pleasure of seeing Rachael Osborn for a follow up visit at the [facility] [place] [facility] [place] on 05/09/2022. She is a 67 y.o. female, who is being followed for persistent asthma, allergic rhinitis, GERD . Her previous allergy office visit was on 12/12/21 with Dr. Simona Osborn. Today is a regular follow up visit.  History obtained from patient, chart review.  At last visit she reported heavy sensation in back similar to bronchitis.  She was recommended to start her albuterol and Alvesco to treat as an asthma flare.  Today she reports 1 week history of sore throat, nasal congestion, post nasal drip,left ear fullness, coughing, headaches and sinus tenderness  Denies wheezing, chest tightness, dyspnea  Denies any fevers, body aches  She is using albuterol more(3-4 times this week) with good affect.   She has had sick contacts with the Influenza both at home and job as high school teacher   She still has back tightness due to concern for sides of effects    Previous pertinent diagnostics: Chest x-ray 10/04/2021 normal clear lungs Spirometry (12/12/21): Ratio 83%, FEV1 2.01L 99%, FVC 2.43L, 93%  Hx of allergy testing: + grass pollen, weed pollen, tree pollen, and dust mite  She was referred to GI for GERD in June 2023    Assessment and Plan: Rachael Osborn is a 67 y.o. female with: Mild intermittent asthma in adult without complication - Plan: Spirometry with Graph  Seasonal and perennial allergic rhinitis  Gastroesophageal reflux disease without esophagitis  Other chronic allergic conjunctivitis of both eyes Plan: Patient Instructions  Asthma- not well  controlled  Breathing tests showed: inflammation in you lungs  We can avoid systemic steroids right not, but we need to start inhaled steroid Start: Alvesco 80-2 puffs twice a day for 2 weeks or until cough and wheeze free.   Continue montelukast 10 mg once a day to prevent cough or wheeze  Continue albuterol 2 puffs every 4 hours as needed for cough or wheeze OR Instead use albuterol 0.083% solution via nebulizer one unit vial every 4 hours as needed for cough or wheeze  In there future for any URI start Alvesco 80-2 puffs twice a day for 2 weeks or until cough and wheeze free.   Allergic rhinitis- with acute flare likely due to sinus infection  Start augmentin 875/'125mg'$  twice a day for 5 days   Continue allergen avoidance measures directed toward grass pollen, weed pollen, tree pollen, and dust mite  Continue loratadine 10 mg once a day as needed  Continue Flonase nasal spray 2 sprays in each nostril once a day Continue azelastine 1 to 2 sprays in each nostril twice a day as needed  Consider saline nasal rinses or saline mist as needed for nasal symptoms. Use this before any medicated nasal sprays for best result Continue Mucinex as needed. Make sure to drink plenty of fluids  Reflux Continue dietary and lifestyle modifications as listed below Continue pantoprazole once a day as previously prescribed Continue work-up with GI as scheduled.  Follow up: in 6 months   Thank you so much for letting me partake in your care today.  Don't hesitate to reach  out if you have any additional concerns!  Rachael Marion, MD  [facility] [place] [place]  No follow-ups on file.  Meds ordered this encounter  Medications   albuterol (VENTOLIN HFA) 108 (90 Base) MCG/ACT inhaler    Sig: Inhale 2 puffs into the lungs every 4 (four) hours as needed for wheezing or shortness of breath.    Dispense:  3 each    Refill:  2   azelastine (ASTELIN) 0.1 % nasal spray    Sig: Place 2 sprays  into both nostrils 2 (two) times daily.    Dispense:  90 mL    Refill:  1    Dispense 90 day supply   ciclesonide (ALVESCO) 80 MCG/ACT inhaler    Sig: For asthma flare/upper respiratory infections start Alvesco taking 2 puffs twice a day with spacer for 2 weeks or until symptoms return to baseline.  Rinse mouth out afterwards.    Dispense:  3 each    Refill:  1    Please dispense 90 day supply   loratadine (CLARITIN) 10 MG tablet    Sig: Take 1 tablet (10 mg total) by mouth daily.    Dispense:  32 tablet    Refill:  5   montelukast (SINGULAIR) 10 MG tablet    Sig: Take 1 tablet (10 mg total) by mouth at bedtime.    Dispense:  30 tablet    Refill:  5   amoxicillin-clavulanate (AUGMENTIN) 875-125 MG tablet    Sig: Take 1 tablet by mouth 2 (two) times daily for 5 days.    Dispense:  10 tablet    Refill:  0    Lab Orders  No laboratory test(s) ordered today   Diagnostics: Tracings reviewed. Her effort: Good reproducible efforts. FVC: 1.95 L FEV1: 1.49 L, 73% predicted FEV1/FVC ratio: 74% Interpretation: Spirometry consistent with normal pattern.  After 4 puffs of albuterol FEV1 increased by 450 cc and 30%, FVC increased by 280 cc and 14%.  This is a significant postbronchodilator response Please see scanned spirometry results for details.   Medication List:  Current Outpatient Medications  Medication Sig Dispense Refill   acetaminophen (TYLENOL) 500 MG tablet Take 1,000 mg by mouth every 6 (six) hours as needed for moderate pain.     albuterol (PROVENTIL) (2.5 MG/3ML) 0.083% nebulizer solution Take 3 mLs (2.5 mg total) by nebulization every 6 (six) hours as needed for wheezing or shortness of breath. 225 mL 1   albuterol (VENTOLIN HFA) 108 (90 Base) MCG/ACT inhaler Inhale 2 puffs into the lungs every 4 (four) hours as needed for wheezing or shortness of breath. 3 each 2   amoxicillin-clavulanate (AUGMENTIN) 875-125 MG tablet Take 1 tablet by mouth 2 (two) times daily for 5 days.  10 tablet 0   anastrozole (ARIMIDEX) 1 MG tablet Take 1 tablet (1 mg total) by mouth daily. 90 tablet 4   aspirin 81 MG chewable tablet Chew 1 tablet by mouth daily.     azelastine (ASTELIN) 0.1 % nasal spray Place 2 sprays into both nostrils 2 (two) times daily. 90 mL 1   benzonatate (TESSALON) 100 MG capsule benzonatate 100 mg capsule  TAKE ONE TO TWO CAPSULES BY MOUTH THREE TIMES DAILY AS NEEDED FOR COUGH     calcium-vitamin D (OSCAL WITH D) 500-200 MG-UNIT TABS tablet Take 1 tablet by mouth daily.     ciclesonide (ALVESCO) 80 MCG/ACT inhaler For asthma flare/upper respiratory infections start Alvesco taking 2 puffs twice a day with  spacer for 2 weeks or until symptoms return to baseline.  Rinse mouth out afterwards. 3 each 1   Crisaborole (EUCRISA) 2 % OINT Apply 1 application topically 2 (two) times daily. 180 g 0   diclofenac Sodium (VOLTAREN) 1 % GEL APPLY 2 GRAMS TO AFFECTED AREAS BY TOPICAL ROUTE 4 TIMES DAILY     diltiazem (TIAZAC) 180 MG 24 hr capsule TAKE 1 CAPSULE(180 MG) BY MOUTH DAILY     diphenhydrAMINE (BENADRYL) 25 mg capsule Take 25 mg by mouth every 6 (six) hours as needed for allergies.     docusate sodium (COLACE) 100 MG capsule Take 1 capsule (100 mg total) by mouth 2 (two) times daily as needed for mild constipation. 30 capsule 1   FEROSUL 325 (65 Fe) MG tablet Take 325 mg by mouth at bedtime.     gabapentin (NEURONTIN) 400 MG capsule Take 400 mg by mouth 4 (four) times daily.     hydrochlorothiazide (HYDRODIURIL) 25 MG tablet Take 25 mg by mouth daily.      loratadine (CLARITIN) 10 MG tablet Take 1 tablet (10 mg total) by mouth daily. 32 tablet 5   montelukast (SINGULAIR) 10 MG tablet Take 1 tablet (10 mg total) by mouth at bedtime. 30 tablet 5   moxifloxacin (VIGAMOX) 0.5 % ophthalmic solution Place 1 drop into the left eye 4 times a day Start after surgery 6 mL 1   moxifloxacin (VIGAMOX) 0.5 % ophthalmic solution Place 1 drop into the right eye 4 times a day start using  once home from surgery 3 mL 1   Multiple Vitamin (MULTIVITAMIN WITH MINERALS) TABS tablet Take 1 tablet by mouth daily.     pantoprazole (PROTONIX) 40 MG tablet Take 1 tablet by mouth daily.     prednisoLONE acetate (PRED FORTE) 1 % ophthalmic suspension Place 1 drop into the left eye 4 times a day Start the day after surgery 10 mL 1   rosuvastatin (CRESTOR) 20 MG tablet Take 20 mg by mouth at bedtime.     sertraline (ZOLOFT) 25 MG tablet Take 1 tablet by mouth daily.     valsartan (DIOVAN) 80 MG tablet Take 80 mg by mouth daily.     Current Facility-Administered Medications  Medication Dose Route Frequency Provider Last Rate Last Admin   tiZANidine (ZANAFLEX) tablet 2 mg  2 mg Oral Q8H PRN Susa Day, MD       Allergies: Allergies  Allergen Reactions   Robaxin [Methocarbamol] Rash   Tizanidine Other (See Comments)   Zyrtec [Cetirizine] Itching   I reviewed her past medical history, social history, family history, and environmental history and no significant changes have been reported from her previous visit.  ROS: All others negative except as noted per HPI.   Objective: BP 112/82   Pulse 78   Temp 97.6 F (36.4 C) (Temporal)   Resp 17   Wt 180 lb 14.4 oz (82.1 kg)   SpO2 99%   BMI 30.10 kg/m  Body mass index is 30.1 kg/m. General Appearance:  Alert, cooperative, no distress, appears stated age  Head:  Normocephalic, without obvious abnormality, atraumatic  Eyes:  Conjunctiva clear, EOM's intact  Nose: Nares normal,  erythematous and edematous nasal mucosa with green rhinorrhea, hypertrophic turbinates, no visible anterior polyps, and septum midline  Throat: Lips, tongue normal; teeth and gums normal, + cobblestoning  Neck: Supple, symmetrical  Lungs:   clear to auscultation bilaterally, Respirations unlabored, no coughing  Heart:  regular rate and rhythm and no  murmur, Appears well perfused  Extremities: No edema  Skin: Skin color, texture, turgor normal, no rashes  or lesions on visualized portions of skin   Neurologic: No gross deficits   Previous notes and tests were reviewed. The plan was reviewed with the patient/family, and all questions/concerned were addressed.  It was my pleasure to see Rachael Osborn today and participate in her care. Please feel free to contact me with any questions or concerns.  Sincerely,  Rachael Marion, MD  Allergy & Immunology  [facility] [place] [facility] Office: [phone]

## 2022-05-09 NOTE — Telephone Encounter (Signed)
Pts insurance does not cover alvesco any more but will cover dulera please advise to change in therapy

## 2022-05-09 NOTE — Patient Instructions (Addendum)
Asthma- not well controlled  Breathing tests showed: inflammation in you lungs  We can avoid systemic steroids right not, but we need to start inhaled steroid Start: Alvesco 80-2 puffs twice a day for 2 weeks or until cough and wheeze free.   Continue montelukast 10 mg once a day to prevent cough or wheeze  Continue albuterol 2 puffs every 4 hours as needed for cough or wheeze OR Instead use albuterol 0.083% solution via nebulizer one unit vial every 4 hours as needed for cough or wheeze  In there future for any URI start Alvesco 80-2 puffs twice a day for 2 weeks or until cough and wheeze free.   Allergic rhinitis- with acute flare likely due to sinus infection  Start augmentin 875/'125mg'$  twice a day for 5 days   Continue allergen avoidance measures directed toward grass pollen, weed pollen, tree pollen, and dust mite  Continue loratadine 10 mg once a day as needed  Continue Flonase nasal spray 2 sprays in each nostril once a day Continue azelastine 1 to 2 sprays in each nostril twice a day as needed  Consider saline nasal rinses or saline mist as needed for nasal symptoms. Use this before any medicated nasal sprays for best result Continue Mucinex as needed. Make sure to drink plenty of fluids  Reflux Continue dietary and lifestyle modifications as listed below Continue pantoprazole once a day as previously prescribed Continue work-up with GI as scheduled.  Follow up: in 6 months   Thank you so much for letting me partake in your care today.  Don't hesitate to reach out if you have any additional concerns!  [name], MD  [facility] [place], [place]

## 2022-05-15 ENCOUNTER — Telehealth: Payer: Self-pay | Admitting: Internal Medicine

## 2022-05-15 NOTE — Telephone Encounter (Signed)
Pt states Ins won't cover Alvesco. Pt would like to know what she needs to do.

## 2022-05-15 NOTE — Telephone Encounter (Signed)
Please advise. Thank you

## 2022-05-16 NOTE — Telephone Encounter (Signed)
Left a message for a return call.

## 2022-05-16 NOTE — Telephone Encounter (Signed)
Lm for pt expiating the change and directions and to call if she had any further issues

## 2022-05-16 NOTE — Telephone Encounter (Signed)
I placed a prescription for advair which should be covered.

## 2022-06-15 ENCOUNTER — Ambulatory Visit: Payer: PPO | Admitting: Internal Medicine

## 2022-11-01 ENCOUNTER — Other Ambulatory Visit: Payer: Self-pay | Admitting: Hematology and Oncology

## 2022-11-03 ENCOUNTER — Telehealth: Payer: Self-pay | Admitting: Hematology and Oncology

## 2022-11-03 NOTE — Telephone Encounter (Signed)
Spoke with patient confirming upcoming appointment  

## 2022-12-09 ENCOUNTER — Other Ambulatory Visit: Payer: Self-pay | Admitting: Internal Medicine

## 2022-12-12 ENCOUNTER — Encounter: Payer: Self-pay | Admitting: Internal Medicine

## 2022-12-12 ENCOUNTER — Ambulatory Visit: Payer: PPO | Admitting: Internal Medicine

## 2022-12-12 ENCOUNTER — Other Ambulatory Visit: Payer: Self-pay

## 2022-12-12 VITALS — BP 110/72 | HR 72 | Temp 98.0°F | Resp 20 | Ht 65.0 in | Wt 188.7 lb

## 2022-12-12 DIAGNOSIS — J4520 Mild intermittent asthma, uncomplicated: Secondary | ICD-10-CM

## 2022-12-12 DIAGNOSIS — H1045 Other chronic allergic conjunctivitis: Secondary | ICD-10-CM | POA: Diagnosis not present

## 2022-12-12 DIAGNOSIS — K219 Gastro-esophageal reflux disease without esophagitis: Secondary | ICD-10-CM | POA: Diagnosis not present

## 2022-12-12 DIAGNOSIS — J302 Other seasonal allergic rhinitis: Secondary | ICD-10-CM

## 2022-12-12 DIAGNOSIS — J3089 Other allergic rhinitis: Secondary | ICD-10-CM | POA: Diagnosis not present

## 2022-12-12 NOTE — Patient Instructions (Addendum)
Asthma- well controlled  Breathing tests showed: looked good   With URI:  START: Alvesco 80-2 puffs twice a day for 2 weeks or until cough and wheeze free.   -rinse mouth out after use  Continue montelukast 10 mg once a day to prevent cough or wheeze  Continue albuterol 2 puffs every 4 hours as needed for cough or wheeze OR Instead use albuterol 0.083% solution via nebulizer one unit vial every 4 hours as needed for cough or wheeze   Allergic rhinitis- well controlled  Continue allergen avoidance measures directed toward grass pollen, weed pollen, tree pollen, and dust mite  Continue loratadine 10 mg once a day as needed  Continue Flonase nasal spray 2 sprays in each nostril once a day Continue azelastine 1 to 2 sprays in each nostril twice a day as needed  Consider saline nasal rinses or saline mist as needed for nasal symptoms. Use this before any medicated nasal sprays for best result Continue Mucinex as needed. Make sure to drink plenty of fluids  Reflux: controlled  Continue dietary and lifestyle modifications Continue pantoprazole once a day Continue work-up with GI as scheduled.  Follow up: in 6 months   Thank you so much for letting me partake in your care today.  Don't hesitate to reach out if you have any additional concerns!  Ferol Luz, MD  [facility] [place] [place]

## 2022-12-12 NOTE — Progress Notes (Unsigned)
Follow Up Note  RE: Rachael Osborn MRN: [id number] DOB: Nov 01, 1955 Date of Office Visit: 12/12/2022  Referring provider: Yolanda Manges, DO Primary care provider: Yolanda Manges, DO  Chief Complaint: Follow-up and Medication Refill (Doing well med refills, some stuffiness this week due to weather)  History of Present Illness: I had the pleasure of seeing Rachael Osborn for a follow up visit at the [facility] [place] on 12/13/2022. She is a 67 y.o. female, who is being followed for persistent asthma, allergic rhinitis, GERD . Her previous allergy office visit was on 05/09/22  with Dr. Marlynn Osborn. Today is a regular follow up visit.  History obtained from patient, chart review.  At last visit she was stepped up to Alvesco 80 mcg 2 puffs twice daily in block therapy , treated for acute sinus infection with Augmentin. FEV1: 1.49 L, 73% predicted with significant post bronchodilatory response    Today she reports Asthma is well controlled, rare use of albuterol (did need yesterday)  Has not had to add of alvesco since last visit. It did help symptoms at last visit.  Denies  Rhinitis is well controlled on montelukast, loratadine daily.  Using astelin and flonase as needed ( no use recently)  Reflux controlled on pantoprazole once daily.  Had a colonoscopy by GI which was normal.    Previous pertinent diagnostics: Chest x-ray 10/04/2021 normal clear lungs Spirometry (12/12/21): Ratio 83%, FEV1 2.01L 99%, FVC 2.43L, 93%  Hx of allergy testing: + grass pollen, weed pollen, tree pollen, and dust mite  She was referred to GI for GERD in June 2023    Assessment and Plan: Rachael Osborn is a 67 y.o. female with: Mild intermittent asthma in adult without complication - Plan: Spirometry with Graph  Seasonal and perennial allergic rhinitis  Gastroesophageal reflux disease without esophagitis  Other chronic allergic conjunctivitis of both eyes Plan: Patient Instructions  Asthma- well  controlled  Breathing tests showed: looked good   With URI:  START: Alvesco 80-2 puffs twice a day for 2 weeks or until cough and wheeze free.   -rinse mouth out after use  Continue montelukast 10 mg once a day to prevent cough or wheeze  Continue albuterol 2 puffs every 4 hours as needed for cough or wheeze OR Instead use albuterol 0.083% solution via nebulizer one unit vial every 4 hours as needed for cough or wheeze   Allergic rhinitis- well controlled  Continue allergen avoidance measures directed toward grass pollen, weed pollen, tree pollen, and dust mite  Continue loratadine 10 mg once a day as needed  Continue Flonase nasal spray 2 sprays in each nostril once a day Continue azelastine 1 to 2 sprays in each nostril twice a day as needed  Consider saline nasal rinses or saline mist as needed for nasal symptoms. Use this before any medicated nasal sprays for best result Continue Mucinex as needed. Make sure to drink plenty of fluids  Reflux: controlled  Continue dietary and lifestyle modifications Continue pantoprazole once a day Continue work-up with GI as scheduled.  Follow up: in 6 months   Thank you so much for letting me partake in your care today.  Don't hesitate to reach out if you have any additional concerns!  Ferol Luz, MD  [facility] [place] [place] No follow-ups on file.  No orders of the defined types were placed in this encounter.   Lab Orders  No laboratory test(s) ordered today   Diagnostics: Tracings  reviewed. Her effort: Good reproducible efforts. FVC: 2.43 L FEV1: 2.09 L, 104% predicted FEV1/FVC ratio: 86% Interpretation: Spirometry consistent with normal pattern.   Please see scanned spirometry results for details.   Medication List:  Current Outpatient Medications  Medication Sig Dispense Refill   acetaminophen (TYLENOL) 500 MG tablet Take 1,000 mg by mouth every 6 (six) hours as needed for moderate pain.     albuterol  (PROVENTIL) (2.5 MG/3ML) 0.083% nebulizer solution Take 3 mLs (2.5 mg total) by nebulization every 6 (six) hours as needed for wheezing or shortness of breath. 225 mL 1   albuterol (VENTOLIN HFA) 108 (90 Base) MCG/ACT inhaler Inhale 2 puffs into the lungs every 4 (four) hours as needed for wheezing or shortness of breath. 3 each 2   anastrozole (ARIMIDEX) 1 MG tablet TAKE 1 TABLET(1 MG) BY MOUTH DAILY 90 tablet 4   aspirin 81 MG chewable tablet Chew 1 tablet by mouth daily.     azelastine (ASTELIN) 0.1 % nasal spray Place 2 sprays into both nostrils 2 (two) times daily. 90 mL 1   benzonatate (TESSALON) 100 MG capsule benzonatate 100 mg capsule  TAKE ONE TO TWO CAPSULES BY MOUTH THREE TIMES DAILY AS NEEDED FOR COUGH     calcium-vitamin D (OSCAL WITH D) 500-200 MG-UNIT TABS tablet Take 1 tablet by mouth daily.     Crisaborole (EUCRISA) 2 % OINT Apply 1 application topically 2 (two) times daily. 180 g 0   diclofenac Sodium (VOLTAREN) 1 % GEL APPLY 2 GRAMS TO AFFECTED AREAS BY TOPICAL ROUTE 4 TIMES DAILY     diltiazem (TIAZAC) 180 MG 24 hr capsule TAKE 1 CAPSULE(180 MG) BY MOUTH DAILY     diphenhydrAMINE (BENADRYL) 25 mg capsule Take 25 mg by mouth every 6 (six) hours as needed for allergies.     docusate sodium (COLACE) 100 MG capsule Take 1 capsule (100 mg total) by mouth 2 (two) times daily as needed for mild constipation. 30 capsule 1   FEROSUL 325 (65 Fe) MG tablet Take 325 mg by mouth at bedtime.     fluticasone-salmeterol (ADVAIR) 100-50 MCG/ACT AEPB Inhale 1 puff into the lungs 2 (two) times daily. 3 each 1   gabapentin (NEURONTIN) 400 MG capsule Take 400 mg by mouth 4 (four) times daily.     hydrochlorothiazide (HYDRODIURIL) 25 MG tablet Take 25 mg by mouth daily.      loratadine (CLARITIN) 10 MG tablet Take 1 tablet (10 mg total) by mouth daily. 32 tablet 5   montelukast (SINGULAIR) 10 MG tablet Take 1 tablet (10 mg total) by mouth at bedtime. 30 tablet 5   moxifloxacin (VIGAMOX) 0.5 %  ophthalmic solution Place 1 drop into the left eye 4 times a day Start after surgery 6 mL 1   moxifloxacin (VIGAMOX) 0.5 % ophthalmic solution Place 1 drop into the right eye 4 times a day start using once home from surgery 3 mL 1   Multiple Vitamin (MULTIVITAMIN WITH MINERALS) TABS tablet Take 1 tablet by mouth daily.     pantoprazole (PROTONIX) 40 MG tablet Take 1 tablet by mouth daily.     prednisoLONE acetate (PRED FORTE) 1 % ophthalmic suspension Place 1 drop into the left eye 4 times a day Start the day after surgery 10 mL 1   rosuvastatin (CRESTOR) 20 MG tablet Take 20 mg by mouth at bedtime.     sertraline (ZOLOFT) 25 MG tablet Take 1 tablet by mouth daily.     valsartan (DIOVAN)  80 MG tablet Take 80 mg by mouth daily.     Current Facility-Administered Medications  Medication Dose Route Frequency Provider Last Rate Last Admin   tiZANidine (ZANAFLEX) tablet 2 mg  2 mg Oral Q8H PRN Jene Every, MD       Allergies: Allergies  Allergen Reactions   Robaxin [Methocarbamol] Rash   Tizanidine Other (See Comments)   Zyrtec [Cetirizine] Itching   I reviewed her past medical history, social history, family history, and environmental history and no significant changes have been reported from her previous visit.  ROS: All others negative except as noted per HPI.   Objective: BP 110/72 (BP Location: Left Arm, Patient Position: Sitting, Cuff Size: Small)   Pulse 72   Temp 98 F (36.7 C) (Oral)   Resp 20   Ht 5\' 5"  (1.651 m)   Wt 188 lb 11.2 oz (85.6 kg)   BMI 31.40 kg/m  Body mass index is 31.4 kg/m. General Appearance:  Alert, cooperative, no distress, appears stated age  Head:  Normocephalic, without obvious abnormality, atraumatic  Eyes:  Conjunctiva clear, EOM's intact  Nose: Nares normal, hypertrophic turbinates, normal mucosa, no visible anterior polyps, and septum midline  Throat: Lips, tongue normal; teeth and gums normal, normal posterior oropharynx  Neck: Supple,  symmetrical  Lungs:   clear to auscultation bilaterally, Respirations unlabored, no coughing  Heart:  regular rate and rhythm and no murmur, Appears well perfused  Extremities: No edema  Skin: Skin color, texture, turgor normal, no rashes or lesions on visualized portions of skin   Neurologic: No gross deficits   Previous notes and tests were reviewed. The plan was reviewed with the patient/family, and all questions/concerned were addressed.  It was my pleasure to see Rachael Osborn today and participate in her care. Please feel free to contact me with any questions or concerns.  Sincerely,  Ferol Luz, MD  Allergy & Immunology  [facility] Office: [phone]

## 2022-12-13 ENCOUNTER — Other Ambulatory Visit: Payer: Self-pay | Admitting: Internal Medicine

## 2023-01-24 ENCOUNTER — Telehealth: Payer: Self-pay | Admitting: *Deleted

## 2023-01-24 NOTE — Telephone Encounter (Signed)
Pt left VM stating " at my last visit - I showed Dr Al Pimple a bump on my back and she said it was likely an ingrown hair- and then my primary doctor said the same thing "  " It is getting bigger and I want to make an appt to come back in for Dr Al Pimple to see it"  This RN returned VM- obtained pt's identified VM- message left informing of need to return to her primary MD or see her surgeon for exam of area for best outcome.  This RN's name given for return call if further concerns.

## 2023-02-08 ENCOUNTER — Other Ambulatory Visit: Payer: Self-pay | Admitting: Internal Medicine

## 2023-02-09 ENCOUNTER — Other Ambulatory Visit: Payer: Self-pay

## 2023-02-13 ENCOUNTER — Encounter: Payer: Self-pay | Admitting: Hematology and Oncology

## 2023-02-21 ENCOUNTER — Other Ambulatory Visit: Payer: Self-pay | Admitting: Internal Medicine

## 2023-02-21 ENCOUNTER — Encounter: Payer: Self-pay | Admitting: Internal Medicine

## 2023-02-21 ENCOUNTER — Ambulatory Visit: Payer: PPO | Admitting: Internal Medicine

## 2023-02-21 VITALS — BP 132/77 | HR 86 | Temp 98.1°F | Resp 20 | Wt 188.8 lb

## 2023-02-21 DIAGNOSIS — J302 Other seasonal allergic rhinitis: Secondary | ICD-10-CM

## 2023-02-21 DIAGNOSIS — J00 Acute nasopharyngitis [common cold]: Secondary | ICD-10-CM

## 2023-02-21 DIAGNOSIS — K219 Gastro-esophageal reflux disease without esophagitis: Secondary | ICD-10-CM | POA: Diagnosis not present

## 2023-02-21 DIAGNOSIS — J3089 Other allergic rhinitis: Secondary | ICD-10-CM | POA: Diagnosis not present

## 2023-02-21 DIAGNOSIS — J4520 Mild intermittent asthma, uncomplicated: Secondary | ICD-10-CM
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
## 2023-02-21 NOTE — Patient Instructions (Signed)
Asthma-  Breathing tests showed: looks good  With URI:  START: Alvesco 80-2 puffs twice a day for 2 weeks or until cough and wheeze free.   -rinse mouth out after use  Continue montelukast 10 mg once a day to prevent cough or wheeze  Continue albuterol 2 puffs every 4 hours as needed for cough or wheeze OR Instead use albuterol 0.083% solution via nebulizer one unit vial every 4 hours as needed for cough or wheeze   Allergic rhinitis- current flare with increased drainage-viral vs allergies Covid swab today was negative Continue allergen avoidance measures directed toward grass pollen, weed pollen, tree pollen, and dust mite  Use saline nasal rinses or saline mist as needed for nasal symptoms. Use this before any medicated nasal sprays for best result Continue loratadine 10 mg once a day as needed  Continue Flonase nasal spray 2 sprays in each nostril once a day Continue azelastine 1 to 2 sprays in each nostril twice a day as needed  Start Mucinex 600 mg twice daily as needed for drainage. Make sure to drink plenty of fluids  Reflux:  Continue dietary and lifestyle modifications Continue pantoprazole once a day Continue work-up with GI as scheduled.  Follow up: in 6 months   Thank you so much for letting me partake in your care today.  Don't hesitate to reach out if you have any additional concerns!  Tonny Bollman, MD [facility]

## 2023-02-24 ENCOUNTER — Encounter: Payer: Self-pay | Admitting: Internal Medicine

## 2023-02-24 MED ORDER — MONTELUKAST SODIUM 10 MG PO TABS: 10.0000 mg | ORAL_TABLET | Freq: Every day | ORAL | 2 refills | Status: DC

## 2023-02-24 MED ORDER — LORATADINE 10 MG PO TABS: 10.0000 mg | ORAL_TABLET | Freq: Every day | ORAL | 1 refills | Status: DC | PRN

## 2023-02-24 MED ORDER — DOXYCYCLINE HYCLATE 100 MG PO TABS: 100.0000 mg | ORAL_TABLET | Freq: Two times a day (BID) | ORAL | 0 refills | Status: AC

## 2023-02-26 ENCOUNTER — Telehealth: Payer: Self-pay | Admitting: Internal Medicine

## 2023-02-26 NOTE — Telephone Encounter (Signed)
Patient is requesting an RX for coughing please send to [facility] on [address] in [place]

## 2023-02-26 NOTE — Telephone Encounter (Signed)
Left message for pt to call us back about this cough ned to know is it dry, wet,  is she coughing up sputum if so what color  when did it start What has sh tried to help relieve it at home What all meds is she currently taking

## 2023-03-07 ENCOUNTER — Telehealth: Payer: Self-pay

## 2023-03-07 NOTE — Telephone Encounter (Signed)
Pt Rachael Osborn requesting an appt change from Nov 12th to Nov 11th. This RN called pt back to inform her that Dr. Al Pimple would not be here on Nov 11th. Pt stated that she will plan to proceed with appt on Nov 12th.

## 2023-03-13 ENCOUNTER — Inpatient Hospital Stay: Payer: PPO | Attending: Hematology and Oncology | Admitting: Hematology and Oncology

## 2023-03-13 VITALS — BP 122/78 | HR 84 | Temp 98.1°F | Resp 18 | Wt 189.0 lb

## 2023-03-13 DIAGNOSIS — Z13820 Encounter for screening for osteoporosis: Secondary | ICD-10-CM

## 2023-03-13 DIAGNOSIS — Z8049 Family history of malignant neoplasm of other genital organs: Secondary | ICD-10-CM | POA: Insufficient documentation

## 2023-03-13 DIAGNOSIS — Z800 Family history of malignant neoplasm of digestive organs: Secondary | ICD-10-CM | POA: Diagnosis not present

## 2023-03-13 DIAGNOSIS — J329 Chronic sinusitis, unspecified: Secondary | ICD-10-CM | POA: Insufficient documentation

## 2023-03-13 DIAGNOSIS — Z803 Family history of malignant neoplasm of breast: Secondary | ICD-10-CM | POA: Insufficient documentation

## 2023-03-13 DIAGNOSIS — Z923 Personal history of irradiation: Secondary | ICD-10-CM | POA: Insufficient documentation

## 2023-03-13 DIAGNOSIS — Z79811 Long term (current) use of aromatase inhibitors: Secondary | ICD-10-CM | POA: Insufficient documentation

## 2023-03-13 DIAGNOSIS — D0511 Intraductal carcinoma in situ of right breast: Secondary | ICD-10-CM | POA: Diagnosis not present
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
## 2023-03-26 ENCOUNTER — Other Ambulatory Visit: Payer: Self-pay

## 2023-03-26 ENCOUNTER — Encounter: Payer: Self-pay | Admitting: Family

## 2023-03-26 ENCOUNTER — Ambulatory Visit: Payer: PPO | Admitting: Family

## 2023-03-26 ENCOUNTER — Telehealth: Payer: Self-pay

## 2023-03-26 VITALS — BP 128/72 | HR 86 | Temp 97.7°F | Resp 20 | Ht 65.0 in | Wt 189.5 lb

## 2023-03-26 DIAGNOSIS — K219 Gastro-esophageal reflux disease without esophagitis: Secondary | ICD-10-CM | POA: Diagnosis not present

## 2023-03-26 DIAGNOSIS — J320 Chronic maxillary sinusitis: Secondary | ICD-10-CM

## 2023-03-26 DIAGNOSIS — J3089 Other allergic rhinitis: Secondary | ICD-10-CM | POA: Diagnosis not present

## 2023-03-26 DIAGNOSIS — J4520 Mild intermittent asthma, uncomplicated: Secondary | ICD-10-CM

## 2023-03-26 DIAGNOSIS — J302 Other seasonal allergic rhinitis: Secondary | ICD-10-CM

## 2023-03-26 MED ORDER — SULFAMETHOXAZOLE-TRIMETHOPRIM 800-160 MG PO TABS
1.0000 | ORAL_TABLET | Freq: Two times a day (BID) | ORAL | 0 refills | Status: AC
Start: 2023-03-26 — End: 2023-04-09

## 2023-03-26 MED ORDER — BENZONATATE 100 MG PO CAPS: 100.0000 mg | ORAL_CAPSULE | Freq: Three times a day (TID) | ORAL | 0 refills | Status: DC | PRN

## 2023-03-26 NOTE — Telephone Encounter (Signed)
Noted! Thank you

## 2023-03-26 NOTE — Patient Instructions (Addendum)
Asthma-  Your breathing test looks good today Will send in tessalon Perles for your cough With URI:  START: Alvesco 80-2 puffs twice a day for 2 weeks or until cough and wheeze free.   -rinse mouth out after use  Continue montelukast 10 mg once a day to prevent cough or wheeze  Continue albuterol 2 puffs every 4 hours as needed for cough or wheeze OR Instead use albuterol 0.083% solution via nebulizer one unit vial every 4 hours as needed for cough or wheeze   Allergic rhinitis- not well controlled with chronic sinus infection. Symptoms did not go away with doxycyline. Unable to take Augmentin due to diarrhea Start Bactrim taking 1 tablet twice a day for 14 days We will refer to to ENT We will also schedule you for sinus CT without contrast. We will call your insurance to  check for pre-authorization Continue allergen avoidance measures directed toward grass pollen, weed pollen, tree pollen, and dust mite  Use saline nasal rinses or saline mist as needed for nasal symptoms. Use this before any medicated nasal sprays for best result Continue loratadine 10 mg once a day as needed  Continue Flonase nasal spray 2 sprays in each nostril once a day Continue azelastine 1 to 2 sprays in each nostril twice a day as needed    Reflux:  Continue dietary and lifestyle modifications Continue pantoprazole once a day Continue work-up with GI as scheduled.  Already scheduled follow-up appointment on June 26, 2023 with AN Ambs, FNP at 8:30 AM

## 2023-03-26 NOTE — Telephone Encounter (Signed)
Spoke to [place] at [phone] (option 3) ct sinus without CPT [phone], diagnosis J 32.9 Chronic sinusitis. It does NOT  require auth order placed in epic as pending.

## 2023-03-26 NOTE — Progress Notes (Signed)
[address] [place] [address] Dept: [phone]  FOLLOW UP NOTE  Patient ID: Rachael Osborn, female    DOB: 05-Mar-1956  Age: 67 y.o. MRN: [id number] Date of Office Visit: 03/26/2023  Assessment  Chief Complaint: Cough and Sinus Problem (Cough, sinus issues,)  HPI Rachael Osborn is a 67 year old female who presents today for an acute visit of coughing.  She was last seen on February 21, 2023 by Dr. Maurine Minister for acute nasopharyngitis, mild intermittent asthma, seasonal and perennial allergic rhinitis, and gastroesophageal reflux disease.  She denies any new diagnosis or surgeries since her last office visit.  Allergic rhinitis: She reports that she cannot get over the symptoms that she has been having for the past 2 to 3 months.  She reports that she finished the antibiotic that was called in approximately a month ago and her symptoms did not ever go away.  On February 24, 2023 she was prescribed doxycycline 100 mg twice a day.  She reports that she is not able to take Augmentin due to it causing diarrhea.  She has been able to tolerate amoxicillin 500 mg in the past.  She reports sinus pressure on the right side of her cheek region.  She also has pain in the right upper side of her gums at times.  She has seen her endodontist and reports that her x-rays look fine.  She reports drainage down her throat that is not yellow, hoarse voice, nasal congestion, clear rhinorrhea.  She is currently taking loratadine 10 mg daily, Flonase nasal spray 2 sprays each nostril once a day, azelastine 2 sprays each nostril once a day, and yesterday she started Mucinex and hot tea with lemon.  She has not been using saline rinses.  She has not had sinus imaging.  She has had sinus surgery in the late 1990s or early 2000's due to a deviated septum.  Mild intermittent asthma: She reports a cough that she feels is due to drainage.  She has had some wheezing and a little bit of shortness of breath this morning with  exertion while going up and down the steps.  She denies tightness in chest and nocturnal awakenings due to breathing problems.  She also denies fever and bodyaches.  She reports a little bit of chills.  Since her last office visit she has not required any systemic steroids or made any trips to the emergency room or urgent care due to breathing problems.  She does mention that she does not take steroids due to her diagnosis of AVT.  She is requesting a refill of Tessalon Perles for her cough.  Reflux is reported as the same.  She reports that she has symptoms daily.  She continues to take pantoprazole once a day and will take calcium and vitamin D at night that helps.  She reports that she is seeing GI and could not find anything.   Drug Allergies:  Allergies  Allergen Reactions   Robaxin [Methocarbamol] Rash   Tizanidine Other (See Comments)   Zyrtec [Cetirizine] Itching    Review of Systems: Negative except as per HPI   Physical Exam: BP 128/72 (BP Location: Left Arm, Patient Position: Sitting, Cuff Size: Normal)   Pulse 86   Temp 97.7 F (36.5 C) (Temporal)   Resp 20   Ht 5\' 5"  (1.651 m)   Wt 189 lb 8 oz (86 kg)   SpO2 100%   BMI 31.53 kg/m    Physical Exam Constitutional:  Appearance: Normal appearance.  HENT:     Head: Normocephalic and atraumatic.     Comments: Pharynx normal, eyes normal, ears normal, nose: Bilateral lower turbinates mildly edematous and erythematous with no drainage noted    Right Ear: Tympanic membrane, ear canal and external ear normal.     Left Ear: Tympanic membrane, ear canal and external ear normal.     Mouth/Throat:     Mouth: Mucous membranes are moist.     Pharynx: Oropharynx is clear.  Eyes:     Conjunctiva/sclera: Conjunctivae normal.  Cardiovascular:     Rate and Rhythm: Regular rhythm.     Heart sounds: Normal heart sounds.  Pulmonary:     Effort: Pulmonary effort is normal.     Breath sounds: Normal breath sounds.     Comments:  Lungs clear to auscultation Musculoskeletal:     Cervical back: Neck supple.  Skin:    General: Skin is warm.  Neurological:     Mental Status: She is alert and oriented to person, place, and time.  Psychiatric:        Mood and Affect: Mood normal.        Behavior: Behavior normal.        Thought Content: Thought content normal.        Judgment: Judgment normal.     Diagnostics: FVC 2.20 L (86%), FEV1 1.92 L (96%), FEV1/FVC 0.87.  Predicted FVC 2.57 L, predicted FEV1 2.00 L.  Spirometry indicates normal respiratory function.  Assessment and Plan: 1. Chronic maxillary sinusitis   2. Mild intermittent asthma in adult without complication   3. Seasonal and perennial allergic rhinitis   4. Gastroesophageal reflux disease, unspecified whether esophagitis present     Meds ordered this encounter  Medications   sulfamethoxazole-trimethoprim (BACTRIM DS) 800-160 MG tablet    Sig: Take 1 tablet by mouth 2 (two) times daily for 14 days.    Dispense:  28 tablet    Refill:  0   benzonatate (TESSALON) 100 MG capsule    Sig: Take 1 capsule (100 mg total) by mouth 3 (three) times daily as needed for cough.    Dispense:  20 capsule    Refill:  0    Patient Instructions  Asthma-  Your breathing test looks good today Will send in tessalon Perles for your cough With URI:  START: Alvesco 80-2 puffs twice a day for 2 weeks or until cough and wheeze free.   -rinse mouth out after use  Continue montelukast 10 mg once a day to prevent cough or wheeze  Continue albuterol 2 puffs every 4 hours as needed for cough or wheeze OR Instead use albuterol 0.083% solution via nebulizer one unit vial every 4 hours as needed for cough or wheeze   Allergic rhinitis- not well controlled with chronic sinus infection. Symptoms did not go away with doxycyline. Unable to take Augmentin due to diarrhea Start Bactrim taking 1 tablet twice a day for 14 days We will refer to to ENT We will also schedule you for  sinus CT without contrast. We will call your insurance to  check for pre-authorization Continue allergen avoidance measures directed toward grass pollen, weed pollen, tree pollen, and dust mite  Use saline nasal rinses or saline mist as needed for nasal symptoms. Use this before any medicated nasal sprays for best result Continue loratadine 10 mg once a day as needed  Continue Flonase nasal spray 2 sprays in each nostril once a day Continue azelastine 1  to 2 sprays in each nostril twice a day as needed    Reflux:  Continue dietary and lifestyle modifications Continue pantoprazole once a day Continue work-up with GI as scheduled.  Already scheduled follow-up appointment on June 26, 2023 with AN Ambs, FNP at 8:30 AM    Return in about 3 months (around 06/26/2023), or if symptoms worsen or fail to improve.    Thank you for the opportunity to care for this patient.  Please do not hesitate to contact me with questions.  Nehemiah Settle, FNP [facility]

## 2023-03-28 NOTE — Telephone Encounter (Signed)
Left message on pt's cell, if she has not heard from scheduling regarding her CT she can call them at [phone]

## 2023-04-05 NOTE — Telephone Encounter (Signed)
she states she would like a referral to an ENT prior to the CT scan if possible. Would patient benefit?

## 2023-04-05 NOTE — Telephone Encounter (Signed)
I am okay with that. Rachael Osborn is the one who saw her last, but we are together and discussed plan. It looks like she has already been referred to ENT.  Dee-do you know if referral has been scheduled? She can hold on CT scan until after ENT sees her. Cancel the order please.

## 2023-04-05 NOTE — Telephone Encounter (Signed)
Pt request a call back, she states she would like a referral to an ENT prior to the CT scan if possible.

## 2023-04-09 NOTE — Telephone Encounter (Signed)
Referral has been placed to the following:  Dr. Evangeline [facility].) [address] [place], [place] [address] [phone]  Left a detailed voicemail to inform the patient.

## 2023-04-10 ENCOUNTER — Encounter (INDEPENDENT_AMBULATORY_CARE_PROVIDER_SITE_OTHER): Payer: Self-pay | Admitting: Otolaryngology

## 2023-04-10 NOTE — Telephone Encounter (Signed)
Noted. Thanks.

## 2023-04-26 ENCOUNTER — Emergency Department (HOSPITAL_BASED_OUTPATIENT_CLINIC_OR_DEPARTMENT_OTHER)
Admission: EM | Admit: 2023-04-26 | Discharge: 2023-04-26 | Disposition: A | Discharge status: Home / Self Care | Payer: PPO | Attending: Emergency Medicine | Admitting: Emergency Medicine

## 2023-04-26 ENCOUNTER — Other Ambulatory Visit: Payer: Self-pay

## 2023-04-26 ENCOUNTER — Emergency Department (HOSPITAL_BASED_OUTPATIENT_CLINIC_OR_DEPARTMENT_OTHER): Payer: PPO

## 2023-04-26 ENCOUNTER — Encounter (HOSPITAL_BASED_OUTPATIENT_CLINIC_OR_DEPARTMENT_OTHER): Payer: Self-pay

## 2023-04-26 DIAGNOSIS — I10 Essential (primary) hypertension: Secondary | ICD-10-CM | POA: Diagnosis not present

## 2023-04-26 DIAGNOSIS — J45909 Unspecified asthma, uncomplicated: Secondary | ICD-10-CM | POA: Diagnosis not present

## 2023-04-26 DIAGNOSIS — Z1152 Encounter for screening for COVID-19: Secondary | ICD-10-CM | POA: Insufficient documentation

## 2023-04-26 DIAGNOSIS — Z79899 Other long term (current) drug therapy: Secondary | ICD-10-CM | POA: Diagnosis not present

## 2023-04-26 DIAGNOSIS — M25511 Pain in right shoulder: Secondary | ICD-10-CM | POA: Insufficient documentation

## 2023-04-26 DIAGNOSIS — R059 Cough, unspecified: Secondary | ICD-10-CM | POA: Diagnosis present

## 2023-04-26 DIAGNOSIS — Z853 Personal history of malignant neoplasm of breast: Secondary | ICD-10-CM | POA: Diagnosis not present

## 2023-04-26 DIAGNOSIS — R058 Other specified cough: Secondary | ICD-10-CM

## 2023-04-26 LAB — RESP PANEL BY RT-PCR (RSV, FLU A&B, COVID)  RVPGX2
Influenza A by PCR: NEGATIVE
Influenza B by PCR: NEGATIVE
Resp Syncytial Virus by PCR: NEGATIVE
SARS Coronavirus 2 by RT PCR: NEGATIVE

## 2023-04-26 MED ORDER — AZITHROMYCIN 250 MG PO TABS: ORAL_TABLET | ORAL | 0 refills | Status: DC

## 2023-04-26 MED ORDER — HYDROCODONE-ACETAMINOPHEN 7.5-325 MG/15ML PO SOLN: 10.0000 mL | Freq: Every day | ORAL | 0 refills | Status: AC

## 2023-04-26 NOTE — Discharge Instructions (Addendum)
As we discussed, your workup in the ER today was reassuring for acute findings.  X-ray imaging of your chest did not reveal emergent concerns and your COVID, flu, and RSV swab is negative.  I suspect that you have something known as postviral cough syndrome.  This can unfortunately run for some time after you have a viral respiratory infection.  For this, I have given you a prescription for codeine cough syrup for you to take as prescribed as needed at night for management of your symptoms.  Do not drive or operate heavy machinery while taking this medication as it can be sedating.  Have also given you azithromycin to cover for any bacterial cause of your symptoms.  Please follow-up with your primary doctor as well as ENT at your already scheduled appointment.  Additionally, for your shoulder soreness, you are unfortunately allergic to muscle relaxers which is what we talked about treating with this.  I instead recommend that you rest your arm and take Tylenol/Motrin as needed for pain.  You may also use ice as needed.  Follow-up with your primary doctor for continued evaluation and management of this.  Return if development of any new or worsening symptoms.

## 2023-04-26 NOTE — ED Triage Notes (Signed)
The patient having cough and congestion for over two weeks. She denied shortness of breath.

## 2023-04-26 NOTE — ED Provider Notes (Signed)
[place] [facility] [facility] Provider Note   CSN: [id number] Arrival date & time: 04/26/23  1117     History  Chief Complaint  Patient presents with   Cough    Rachael Osborn is a 67 y.o. female.  Patient with history of hypertension, allergic rhinitis, asthma, GERD presents today with complaints of cough. She states that same has been ongoing for the past 2 months. She states that same started as URI symptoms with cough, congestion, rhinorrhea, and bodyaches. She saw her allergist at that time and was prescribed doxycycline which she took with improvement in her other symptoms, however she has continued to have a persistent cough. She states that she went back to her allergist and was prescribed Bactrim, however she never took this medication because she was concerned about the potential side effects. She was also referred to ENT and has an appointment in January. Of note, patient states she will not take steroids because she is concerned about the risk of osteoporosis. She does note that she has had success with codeine cough syrup in the past and is requesting same. She does not smoke.  Additionally, patient states that her right arm has been sore for the past week. States she has been doing some heavy lifting and has been carrying her grandchild around and suspects she is experiencing muscle soreness, however states that she does have a history of breast cancer and has had lumpectomy on this side and has been told that she needs to limit the amount that she uses this arm.  She is therefore requesting evaluation of this soreness as well.  Denies chest pain or shortness of breath.  Pain is worse with movement and relieved with rest.  She states that she has been resting her arm the last couple of days and it has improved.  Denies arm swelling or redness.  The history is provided by the patient. No language interpreter was used.  Cough Associated symptoms:  myalgias        Home Medications Prior to Admission medications   Medication Sig Start Date End Date Taking? Authorizing Provider  acetaminophen (TYLENOL) 500 MG tablet Take 1,000 mg by mouth every 6 (six) hours as needed for moderate pain.    [provider]  albuterol (PROVENTIL) (2.5 MG/3ML) 0.083% nebulizer solution Take 3 mLs (2.5 mg total) by nebulization every 6 (six) hours as needed for wheezing or shortness of breath. 06/13/21   Verlee Monte, MD  albuterol (VENTOLIN HFA) 108 (90 Base) MCG/ACT inhaler Inhale 2 puffs into the lungs every 4 (four) hours as needed for wheezing or shortness of breath. 05/09/22   Ferol Luz, MD  anastrozole (ARIMIDEX) 1 MG tablet TAKE 1 TABLET(1 MG) BY MOUTH DAILY 11/01/22   Rachel Moulds, MD  aspirin 81 MG chewable tablet Chew 1 tablet by mouth daily.    [provider]  azelastine (ASTELIN) 0.1 % nasal spray Place 2 sprays into both nostrils 2 (two) times daily. 05/09/22   Ferol Luz, MD  benzonatate (TESSALON) 100 MG capsule Take 1 capsule (100 mg total) by mouth 3 (three) times daily as needed for cough. 03/26/23   Nehemiah Settle, FNP  calcium-vitamin D (OSCAL WITH D) 500-200 MG-UNIT TABS tablet Take 1 tablet by mouth daily.    [provider]  Crisaborole (EUCRISA) 2 % OINT Apply 1 application topically 2 (two) times daily. 11/05/19   Hetty Blend, FNP  diclofenac Sodium (VOLTAREN) 1 % GEL APPLY  2 GRAMS TO AFFECTED AREAS BY TOPICAL ROUTE 4 TIMES DAILY 07/01/21   [provider]  diltiazem (TIAZAC) 180 MG 24 hr capsule TAKE 1 CAPSULE(180 MG) BY MOUTH DAILY 09/20/21   [provider]  diphenhydrAMINE (BENADRYL) 25 mg capsule Take 25 mg by mouth every 6 (six) hours as needed for allergies.    [provider]  docusate sodium (COLACE) 100 MG capsule Take 1 capsule (100 mg total) by mouth 2 (two) times daily as needed for mild constipation. 05/20/21   Jene Every, MD  FEROSUL 325 (65 Fe) MG tablet  Take 325 mg by mouth at bedtime. 09/21/21   [provider]  fluticasone-salmeterol (ADVAIR) 100-50 MCG/ACT AEPB Inhale 1 puff into the lungs 2 (two) times daily. 05/09/22   Ferol Luz, MD  gabapentin (NEURONTIN) 400 MG capsule Take 400 mg by mouth 4 (four) times daily. 01/02/20   [provider]  hydrochlorothiazide (HYDRODIURIL) 25 MG tablet Take 25 mg by mouth daily.     [provider]  loratadine (CLARITIN) 10 MG tablet Take 1 tablet (10 mg total) by mouth daily as needed for allergies. 02/24/23   Verlee Monte, MD  montelukast (SINGULAIR) 10 MG tablet Take 1 tablet (10 mg total) by mouth at bedtime. 02/24/23   Verlee Monte, MD  moxifloxacin (VIGAMOX) 0.5 % ophthalmic solution Place 1 drop into the left eye 4 times a day Start after surgery 10/18/21     moxifloxacin (VIGAMOX) 0.5 % ophthalmic solution Place 1 drop into the right eye 4 times a day start using once home from surgery 11/04/21     Multiple Vitamin (MULTIVITAMIN WITH MINERALS) TABS tablet Take 1 tablet by mouth daily.    [provider]  pantoprazole (PROTONIX) 40 MG tablet Take 1 tablet by mouth daily. 09/21/21   [provider]  prednisoLONE acetate (PRED FORTE) 1 % ophthalmic suspension Place 1 drop into the left eye 4 times a day Start the day after surgery 10/18/21     rosuvastatin (CRESTOR) 20 MG tablet Take 20 mg by mouth at bedtime. 09/13/21   [provider]  sertraline (ZOLOFT) 25 MG tablet Take 1 tablet by mouth daily. 09/21/21   [provider]  valsartan (DIOVAN) 80 MG tablet Take 80 mg by mouth daily.    [provider]      Allergies    Robaxin [methocarbamol], Tizanidine, and Zyrtec [cetirizine]    Review of Systems   Review of Systems  Respiratory:  Positive for cough.   Musculoskeletal:  Positive for myalgias.  All other systems reviewed and are negative.   Physical Exam Updated Vital Signs BP 128/88 (BP Location: Left Arm)   Pulse 86    Temp 97.8 F (36.6 C) (Oral)   Resp 18   Ht 5\' 5"  (1.651 m)   Wt 86 kg   SpO2 99%   BMI 31.55 kg/m  Physical Exam Vitals and nursing note reviewed.  Constitutional:      General: She is not in acute distress.    Appearance: Normal appearance. She is normal weight. She is not ill-appearing, toxic-appearing or diaphoretic.  HENT:     Head: Normocephalic and atraumatic.  Neck:     Comments: No meningismus Cardiovascular:     Rate and Rhythm: Normal rate and regular rhythm.     Heart sounds: Normal heart sounds.  Pulmonary:     Effort: Pulmonary effort is normal. No respiratory distress.     Breath sounds: Normal breath  sounds. No wheezing.  Abdominal:     General: Abdomen is flat.     Palpations: Abdomen is soft.     Tenderness: There is no abdominal tenderness.  Musculoskeletal:        General: Normal range of motion.     Cervical back: Normal range of motion and neck supple.     Comments: Muscular tightness and tenderness noted to the right bicep tendon area with full ROM intact with some discomfort. No erythema, warmth, fluctuance, induration, or overlying skin changes.  Radial and ulnar pulses intact and 2+.  No focal bony tenderness or deformity.  Skin:    General: Skin is warm and dry.  Neurological:     General: No focal deficit present.     Mental Status: She is alert.  Psychiatric:        Mood and Affect: Mood normal.        Behavior: Behavior normal.     ED Results / Procedures / Treatments   Labs (all labs ordered are listed, but only abnormal results are displayed) Labs Reviewed  RESP PANEL BY RT-PCR (RSV, FLU A&B, COVID)  [id number]    EKG None  Radiology DG Chest 2 View Result Date: 04/26/2023 CLINICAL DATA:  67 year old female with cough and congestion for 2 weeks. No improvement with antibiotics. EXAM: CHEST - 2 VIEW COMPARISON:  Chest radiographs 10/04/2021 and earlier. FINDINGS: PA and lateral views 1145 hours. Mildly tortuous thoracic aorta. Other  mediastinal contours are within normal limits. Visualized tracheal air column is within normal limits. Lung volumes are stable, within normal limits. Lung markings are stable since 2019, within normal limits. No pneumothorax or pleural effusion. Small right chest wall, breast surgical clips redemonstrated. No acute osseous abnormality identified. Negative visible bowel gas. IMPRESSION: No acute cardiopulmonary abnormality. Electronically Signed   By: Odessa Fleming M.D.   On: 04/26/2023 12:00    Procedures Procedures    Medications Ordered in ED Medications - No data to display  ED Course/ Medical Decision Making/ A&P                                 Medical Decision Making Amount and/or Complexity of Data Reviewed Radiology: ordered.   This patient is a 67 y.o. female who presents to the ED for concern of cough, right shoulder soreness, this involves an extensive number of treatment options, and is a complaint that carries with it a high risk of complications and morbidity. The emergent differential diagnosis prior to evaluation includes, but is not limited to,  post-viral cough, asthma exacerbation, GERD, ace inhibitor, allergies/irritants, asthma, foreign body, medications (ACE inhibitors), CHF, interstitial lung disease  This is not an exhaustive differential.   Past Medical History / Co-morbidities / Social History:  has a past medical history of Anxiety, Arthritis, Asthma, Breast cancer (HCC), Environmental and seasonal allergies, Family history of breast cancer, Family history of cancer of gallbladder, Family history of cervical cancer, Family history of leukemia, Family history of melanoma, Family history of prostate cancer, GERD (gastroesophageal reflux disease), Hypertension, Nerve pain, Peripheral vascular disease (HCC), and SVT (supraventricular tachycardia) (HCC).  Additional history: Chart reviewed. Pertinent results include: seen by her allergist for symptoms back in November,  initially given doxycycline then bactrim. Also given tessalon for cough. Also given PPI for reflux.   Physical Exam: Physical exam performed. The pertinent findings include: Well-appearing, speaking in complete sentences.  Lung sounds clear to  auscultation.  Muscle tightness and tenderness noted to palpation of the right shoulder area with no deformity or skin changes.  No limitations to ROM.  Good distal pulses and sensation.  Lab Tests: I ordered, and personally interpreted labs.  The pertinent results include: COVID, flu, and RSV negative.   Imaging Studies: I ordered imaging studies including CXR. I independently visualized and interpreted imaging which showed NAD. I agree with the radiologist interpretation.   Disposition: After consideration of the diagnostic results and the patients response to treatment, I feel that emergency department workup does not suggest an emergent condition requiring admission or immediate intervention beyond what has been performed at this time. The plan is: Discharge with close outpatient follow-up and return precautions.  Will send for codeine cough syrup for cough suppression per patient's request.  I also added a Z-Pak given duration of symptoms and patient's age and comorbid risk factors.  PDMP reviewed.  Patient advised not to drive or operate heavy machinery while taking this medication.  Educated on increased fall risk associated with this medication as well.  I did offer a steroid taper, however patient states that she will not take steroids due to risk of osteoporosis.  I did discuss with her that the risk of this is minimal with for a short taper, patient continues to decline.  Recommend she see ENT at your already scheduled appointment.  Recommend RICE and Tylenol/Motrin for her shoulder soreness.  Do not suspect that this is due to underlying lung or cardiac pathology as it is clearly reproduced to palpation and worse with movement.  Discussed with patient  who is understanding and in agreement with this.  Evaluation and diagnostic testing in the emergency department does not suggest an emergent condition requiring admission or immediate intervention beyond what has been performed at this time.  Plan for discharge with close PCP follow-up.  Patient is understanding and amenable with plan, educated on red flag symptoms that would prompt immediate return.  Patient discharged in stable condition.   I discussed this case with my attending physician Dr. Earlene Plater who cosigned this note including patient's presenting symptoms, physical exam, and planned diagnostics and interventions. Attending physician stated agreement with plan or made changes to plan which were implemented.    Final Clinical Impression(s) / ED Diagnoses Final diagnoses:  Acute pain of right shoulder  Post-viral cough syndrome    Rx / DC Orders ED Discharge Orders          Ordered    HYDROcodone-acetaminophen (HYCET) 7.5-325 mg/15 ml solution  Daily at bedtime        04/26/23 1539    azithromycin (ZITHROMAX Z-PAK) 250 MG tablet        04/26/23 1539          An After Visit Summary was printed and given to the patient.     Vear Clock 04/26/23 1543    Laurence Spates, MD 04/27/23 [phone]

## 2023-04-28 ENCOUNTER — Telehealth (HOSPITAL_BASED_OUTPATIENT_CLINIC_OR_DEPARTMENT_OTHER): Payer: Self-pay | Admitting: Emergency Medicine

## 2023-04-28 MED ORDER — HYDROCODONE BIT-HOMATROP MBR 5-1.5 MG/5ML PO SOLN: 5.0000 mL | Freq: Four times a day (QID) | ORAL | 0 refills | Status: AC | PRN

## 2023-04-28 NOTE — Telephone Encounter (Signed)
Pharmacy called to report they did not have the prescription that the patient needed.  They requested a change to a different medicine which I did with pharmacy guidance.  It was sent into the pharmacy the patient he did.

## 2023-05-14 ENCOUNTER — Telehealth (INDEPENDENT_AMBULATORY_CARE_PROVIDER_SITE_OTHER): Payer: Self-pay | Admitting: Otolaryngology

## 2023-05-14 NOTE — Telephone Encounter (Signed)
 confirmed appt & location [id number] afm

## 2023-05-15 ENCOUNTER — Encounter (INDEPENDENT_AMBULATORY_CARE_PROVIDER_SITE_OTHER): Payer: Self-pay

## 2023-05-15 ENCOUNTER — Ambulatory Visit (INDEPENDENT_AMBULATORY_CARE_PROVIDER_SITE_OTHER): Payer: PPO

## 2023-05-15 VITALS — BP 121/79 | HR 88 | Ht 66.0 in | Wt 188.0 lb

## 2023-05-15 DIAGNOSIS — J343 Hypertrophy of nasal turbinates: Secondary | ICD-10-CM

## 2023-05-15 DIAGNOSIS — J310 Chronic rhinitis: Secondary | ICD-10-CM | POA: Diagnosis not present

## 2023-05-15 DIAGNOSIS — R0981 Nasal congestion: Secondary | ICD-10-CM

## 2023-05-15 DIAGNOSIS — J324 Chronic pansinusitis: Secondary | ICD-10-CM

## 2023-05-16 DIAGNOSIS — J324 Chronic pansinusitis: Secondary | ICD-10-CM | POA: Insufficient documentation

## 2023-05-16 DIAGNOSIS — J343 Hypertrophy of nasal turbinates: Secondary | ICD-10-CM | POA: Insufficient documentation
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
## 2023-05-18 ENCOUNTER — Ambulatory Visit (HOSPITAL_BASED_OUTPATIENT_CLINIC_OR_DEPARTMENT_OTHER)
Admission: RE | Admit: 2023-05-18 | Discharge: 2023-05-18 | Disposition: A | Discharge status: Home / Self Care | Payer: PPO | Source: Ambulatory Visit | Attending: Family

## 2023-05-18 DIAGNOSIS — J320 Chronic maxillary sinusitis: Secondary | ICD-10-CM | POA: Insufficient documentation

## 2023-05-18 NOTE — Progress Notes (Signed)
Results sent to me,but forwarding to Dr. Suszanne Conners, her ENT, to discuss next steps with Rachael Osborn.

## 2023-06-12 ENCOUNTER — Telehealth (INDEPENDENT_AMBULATORY_CARE_PROVIDER_SITE_OTHER): Payer: Self-pay | Admitting: Otolaryngology

## 2023-06-12 NOTE — Telephone Encounter (Signed)
Confirmed appt & location [id number] afm

## 2023-06-13 ENCOUNTER — Ambulatory Visit (INDEPENDENT_AMBULATORY_CARE_PROVIDER_SITE_OTHER): Payer: Self-pay | Admitting: Otolaryngology

## 2023-06-13 ENCOUNTER — Encounter (INDEPENDENT_AMBULATORY_CARE_PROVIDER_SITE_OTHER): Payer: Self-pay

## 2023-06-13 VITALS — BP 134/82 | HR 85 | Ht 66.0 in | Wt 188.0 lb

## 2023-06-13 DIAGNOSIS — J310 Chronic rhinitis: Secondary | ICD-10-CM

## 2023-06-13 DIAGNOSIS — J343 Hypertrophy of nasal turbinates: Secondary | ICD-10-CM | POA: Diagnosis not present

## 2023-06-13 DIAGNOSIS — R0981 Nasal congestion: Secondary | ICD-10-CM | POA: Diagnosis not present

## 2023-06-14 ENCOUNTER — Other Ambulatory Visit: Payer: Self-pay

## 2023-06-14 ENCOUNTER — Emergency Department (HOSPITAL_BASED_OUTPATIENT_CLINIC_OR_DEPARTMENT_OTHER)
Admission: EM | Admit: 2023-06-14 | Discharge: 2023-06-14 | Disposition: A | Discharge status: Home / Self Care | Payer: PPO | Attending: Emergency Medicine | Admitting: Emergency Medicine

## 2023-06-14 ENCOUNTER — Encounter (HOSPITAL_BASED_OUTPATIENT_CLINIC_OR_DEPARTMENT_OTHER): Payer: Self-pay

## 2023-06-14 DIAGNOSIS — R519 Headache, unspecified: Secondary | ICD-10-CM | POA: Diagnosis present

## 2023-06-14 DIAGNOSIS — J45909 Unspecified asthma, uncomplicated: Secondary | ICD-10-CM | POA: Insufficient documentation

## 2023-06-14 DIAGNOSIS — Z7951 Long term (current) use of inhaled steroids: Secondary | ICD-10-CM | POA: Insufficient documentation

## 2023-06-14 DIAGNOSIS — I10 Essential (primary) hypertension: Secondary | ICD-10-CM | POA: Insufficient documentation

## 2023-06-14 DIAGNOSIS — Z79899 Other long term (current) drug therapy: Secondary | ICD-10-CM | POA: Insufficient documentation

## 2023-06-14 DIAGNOSIS — J0110 Acute frontal sinusitis, unspecified: Secondary | ICD-10-CM | POA: Diagnosis not present

## 2023-06-14 DIAGNOSIS — Z20822 Contact with and (suspected) exposure to covid-19: Secondary | ICD-10-CM | POA: Insufficient documentation

## 2023-06-14 DIAGNOSIS — K0889 Other specified disorders of teeth and supporting structures: Secondary | ICD-10-CM | POA: Diagnosis not present

## 2023-06-14 DIAGNOSIS — K219 Gastro-esophageal reflux disease without esophagitis: Secondary | ICD-10-CM | POA: Insufficient documentation

## 2023-06-14 DIAGNOSIS — B349 Viral infection, unspecified: Secondary | ICD-10-CM | POA: Insufficient documentation

## 2023-06-14 LAB — RESP PANEL BY RT-PCR (RSV, FLU A&B, COVID)  RVPGX2
Influenza A by PCR: NEGATIVE
Influenza B by PCR: NEGATIVE
Resp Syncytial Virus by PCR: NEGATIVE
SARS Coronavirus 2 by RT PCR: NEGATIVE

## 2023-06-14 MED ORDER — AMOXICILLIN 500 MG PO CAPS: 500.0000 mg | ORAL_CAPSULE | Freq: Two times a day (BID) | ORAL | 0 refills | Status: DC

## 2023-06-14 NOTE — ED Notes (Signed)
Pt sitting upright in her chair. Has concerns for sinus infection due to congestion and mucous production with head pressure.

## 2023-06-14 NOTE — ED Provider Notes (Signed)
[place] [facility] Provider Note   CSN: [id number] Arrival date & time: 06/14/23  1202     History  Chief Complaint  Patient presents with   URI    Rachael Osborn is a 68 y.o. female with past medical history significant for hypertension, asthma, arthritis, GERD who presents with concern for facial pain, body aches, chills, congestion, nasal drainage, pressure behind eyes starting yesterday.  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  She reports that she did have her flu shot.  She does endorse history of sinus infections.  She was trying Mucinex DM, Tylenol at home with some relief of symptoms   URI      Home Medications Prior to Admission medications   Medication Sig Start Date End Date Taking? Authorizing Provider  amoxicillin (AMOXIL) 500 MG capsule Take 1 capsule (500 mg total) by mouth 2 (two) times daily. 06/14/23  Yes Cai Flott H, PA-C  acetaminophen (TYLENOL) 500 MG tablet Take 1,000 mg by mouth every 6 (six) hours as needed for moderate pain.    [provider]  albuterol (PROVENTIL) (2.5 MG/3ML) 0.083% nebulizer solution Take 3 mLs (2.5 mg total) by nebulization every 6 (six) hours as needed for wheezing or shortness of breath. 06/13/21   Verlee Monte, MD  albuterol (VENTOLIN HFA) 108 (90 Base) MCG/ACT inhaler Inhale 2 puffs into the lungs every 4 (four) hours as needed for wheezing or shortness of breath. 05/09/22   Ferol Luz, MD  anastrozole (ARIMIDEX) 1 MG tablet TAKE 1 TABLET(1 MG) BY MOUTH DAILY 11/01/22   Rachel Moulds, MD  aspirin 81 MG chewable tablet Chew 1 tablet by mouth daily.    [provider]  azelastine (ASTELIN) 0.1 % nasal spray Place 2 sprays into both nostrils 2 (two) times daily. 05/09/22   Ferol Luz, MD  azithromycin (ZITHROMAX Z-PAK) 250 MG tablet Take 2 tablets on day 1  followed by one tablet on days 2-5 Patient not taking: Reported on 06/13/2023 04/26/23   Smoot, Shawn Route, PA-C  benzonatate (TESSALON) 100 MG capsule Take 1 capsule (100 mg total) by mouth 3 (three) times daily as needed for cough. Patient not taking: Reported on 06/13/2023 03/26/23   Nehemiah Settle, FNP  calcium-vitamin D (OSCAL WITH D) 500-200 MG-UNIT TABS tablet Take 1 tablet by mouth daily.    [provider]  Crisaborole (EUCRISA) 2 % OINT Apply 1 application topically 2 (two) times daily. 11/05/19   Ambs, Norvel Richards, FNP  diclofenac Sodium (VOLTAREN) 1 % GEL APPLY 2 GRAMS TO AFFECTED AREAS BY TOPICAL ROUTE 4 TIMES DAILY 07/01/21   [provider]  diltiazem (TIAZAC) 180 MG 24 hr capsule TAKE 1 CAPSULE(180 MG) BY MOUTH DAILY 09/20/21   [provider]  diphenhydrAMINE (BENADRYL) 25 mg capsule Take 25 mg by mouth every 6 (six) hours as needed for allergies.    [provider]  docusate sodium (COLACE) 100 MG capsule Take 1 capsule (100 mg total) by mouth 2 (two) times daily as needed for mild constipation. 05/20/21   Jene Every, MD  FEROSUL 325 (65 Fe) MG tablet Take 325 mg by mouth at bedtime. 09/21/21   [provider]  fluticasone-salmeterol (ADVAIR) 100-50 MCG/ACT AEPB Inhale 1 puff into the lungs 2 (two) times daily. 05/09/22   Ferol Luz, MD  gabapentin (NEURONTIN) 400 MG capsule Take 400  mg by mouth 4 (four) times daily. 01/02/20   [provider]  hydrochlorothiazide (HYDRODIURIL) 25 MG tablet Take 25 mg by mouth daily.     [provider]  HYDROcodone bit-homatropine (HYDROMET) 5-1.5 MG/5ML syrup Take 5 mLs by mouth every 6 (six) hours as needed for cough. 04/28/23   Tegeler, Canary Brim, MD  HYDROcodone-acetaminophen (HYCET) 7.5-325 mg/15 ml solution Take 10 mLs by mouth at bedtime. 04/26/23   Smoot, Shawn Route, PA-C  loratadine (CLARITIN) 10 MG tablet Take 1 tablet (10 mg total) by mouth daily as needed for allergies. 02/24/23    Verlee Monte, MD  montelukast (SINGULAIR) 10 MG tablet Take 1 tablet (10 mg total) by mouth at bedtime. 02/24/23   Verlee Monte, MD  moxifloxacin (VIGAMOX) 0.5 % ophthalmic solution Place 1 drop into the left eye 4 times a day Start after surgery 10/18/21     moxifloxacin (VIGAMOX) 0.5 % ophthalmic solution Place 1 drop into the right eye 4 times a day start using once home from surgery 11/04/21     Multiple Vitamin (MULTIVITAMIN WITH MINERALS) TABS tablet Take 1 tablet by mouth daily.    [provider]  pantoprazole (PROTONIX) 40 MG tablet Take 1 tablet by mouth daily. 09/21/21   [provider]  prednisoLONE acetate (PRED FORTE) 1 % ophthalmic suspension Place 1 drop into the left eye 4 times a day Start the day after surgery 10/18/21     rosuvastatin (CRESTOR) 20 MG tablet Take 20 mg by mouth at bedtime. 09/13/21   [provider]  sertraline (ZOLOFT) 25 MG tablet Take 1 tablet by mouth daily. 09/21/21   [provider]  valsartan (DIOVAN) 80 MG tablet Take 80 mg by mouth daily.    [provider]      Allergies    Robaxin [methocarbamol], Tizanidine, and Zyrtec [cetirizine]    Review of Systems   Review of Systems  All other systems reviewed and are negative.   Physical Exam Updated Vital Signs BP (!) 143/99   Pulse (!) 110   Temp 97.6 F (36.4 C) (Oral)   Resp 16   SpO2 97%  Physical Exam Vitals and nursing note reviewed.  Constitutional:      General: She is not in acute distress.    Appearance: Normal appearance.  HENT:     Head: Normocephalic and atraumatic.     Nose: Congestion present.     Mouth/Throat:     Comments: No significant posterior oropharynx erythema, swelling, exudate. Uvula midline, tonsils 1+ bilaterally.  No trismus, stridor, evidence of PTA, floor of mouth swelling or redness.   Eyes:     General:        Right eye: No discharge.        Left eye: No discharge.  Neck:     Comments: Mild cervical  lymphadenopathy left greater than right, no induration of the anterior neck. Cardiovascular:     Rate and Rhythm: Regular rhythm. Tachycardia present.     Comments: Mild tachycardia on arrival improved on reevaluation. Pulmonary:     Effort: Pulmonary effort is normal. No respiratory distress.     Comments: No wheezing, rhonchi, stridor, rales Musculoskeletal:        General: No deformity.  Skin:    General: Skin is warm and dry.  Neurological:     Mental Status: She is alert and oriented to person, place, and time.  Psychiatric:        Mood and Affect:  Mood normal.        Behavior: Behavior normal.     ED Results / Procedures / Treatments   Labs (all labs ordered are listed, but only abnormal results are displayed) Labs Reviewed  RESP PANEL BY RT-PCR (RSV, FLU A&B, COVID)  [id number]    EKG None  Radiology No results found.  Procedures Procedures    Medications Ordered in ED Medications - No data to display  ED Course/ Medical Decision Making/ A&P                                 Medical Decision Making  This is a well-appearing 68yo female who presents with concern for 2 days of congestion, nasal pressure, chills headache.  My emergent differential diagnosis includes acute upper respiratory infection with COVID, flu, RSV versus new asthma presentation, acute bronchitis, less clinical concern for pneumonia.  Also considered other ENT emergencies, Ludwig angina, strep pharyngitis, mono, versus epiglottis, tonsillitis versus other.  This is not an exhaustive differential.  On my exam patient is overall well-appearing, they have temperature of 97.6, breathing unlabored, no tachypnea, no respiratory distress, stable oxygen saturation.  Patient with mild tachycardia, improved on re-eval.  Bilateral TMs are clear.  RVP independently reviewed by myself shows negative for COVID, flu, RSV.  Patient symptoms are consistent with sinusitis, she has some dental pain as well, considered  dental infection.  Given her severe headache, sinus pressure, tenderness to palpation of the frontal sinuses, and question dental disease I do think treating with amoxicillin is reasonable, she reports diarrhea with Augmentin so we will stick to amoxicillin.  Encouraged ibuprofen, Tylenol, rest, plenty of fluids.  Discussed extensive return precautions.  Patient discharged in stable condition at this time.  Final Clinical Impression(s) / ED Diagnoses Final diagnoses:  Viral syndrome  Acute non-recurrent frontal sinusitis  Pain, dental    Rx / DC Orders ED Discharge Orders          Ordered    amoxicillin (AMOXIL) 500 MG capsule  2 times daily        06/14/23 1424              Goddess Gebbia, [place] H, PA-C 06/14/23 1429    Sloan Leiter, DO 06/18/23 [phone]

## 2023-06-14 NOTE — ED Notes (Signed)
Cannot discharge, registration in chart.

## 2023-06-14 NOTE — ED Triage Notes (Signed)
Pt reports headache, chills and nasal congestion that started yesterday.

## 2023-06-14 NOTE — ED Notes (Signed)

## 2023-06-14 NOTE — Discharge Instructions (Signed)
Please use Tylenol or ibuprofen for pain.  You may use 600 mg ibuprofen every 6 hours or 1000 mg of Tylenol every 6 hours.  You may choose to alternate between the 2.  This would be most effective.  Not to exceed 4 g of Tylenol within 24 hours.  Not to exceed 3200 mg ibuprofen 24 hours.  Continue to use your home Flonase up to twice daily, take some time to rest, drink plenty of fluids.  Please take the entire course of antibiotics that prescribed.  Please follow-up with your primary care doctor return to the emergency department if your symptoms significantly worsen despite treatment.

## 2023-06-15 DIAGNOSIS — J310 Chronic rhinitis: Secondary | ICD-10-CM | POA: Insufficient documentation

## 2023-06-15 NOTE — Progress Notes (Signed)
Patient ID: Rachael Osborn, female   DOB: October 14, 1955, 68 y.o.   MRN: [id number]  Follow-up: Chronic nasal obstruction  HPI: The patient is a 68 year old female who returns today for his follow-up evaluation.  The patient was previously seen for chronic nasal obstruction and recurrent sinusitis.  At his last visit in January 2025, he was noted to have nasal mucosal congestion and bilateral inferior turbinate hypertrophy.  He was treated with Flonase, azelastine, and Singulair.  He also underwent a sinus CT scan.  The CT showed no significant acute or chronic sinusitis.  The patient returns today complaining of persistent nasal obstruction.  He denies any facial pain, fever, or visual change.  Exam: General: Communicates without difficulty, well nourished, no acute distress. Head: Normocephalic, no evidence injury, no tenderness, facial buttresses intact without stepoff. Face/sinus: No tenderness to palpation and percussion. Facial movement is normal and symmetric. Eyes: PERRL, EOMI. No scleral icterus, conjunctivae clear. Neuro: CN II exam reveals vision grossly intact.  No nystagmus at any point of gaze. Ears: Auricles well formed without lesions.  Ear canals are intact without mass or lesion.  No erythema or edema is appreciated.  The TMs are intact without fluid. Nose: External evaluation reveals normal support and skin without lesions.  Dorsum is intact.  Anterior rhinoscopy reveals congested mucosa over anterior aspect of inferior turbinates and intact septum.  No purulence noted. Oral:  Oral cavity and oropharynx are intact, symmetric, without erythema or edema.  Mucosa is moist without lesions. Neck: Full range of motion without pain.  There is no significant lymphadenopathy.  No masses palpable.  Thyroid bed within normal limits to palpation.  Parotid glands and submandibular glands equal bilaterally without mass.  Trachea is midline. Neuro:  CN 2-12 grossly intact.   Assessment: 1.  Chronic rhinitis  with nasal mucosal congestion and bilateral inferior turbinate hypertrophy. 2.  No significant sinusitis was noted on his recent CT scan.  Plan: 1.  The physical exam findings and the CT images are reviewed with the patient. 2.  Continue with Flonase nasal spray 2 sprays each nostril daily. 3.  Nasal saline irrigation is encouraged. 4.  The patient will return for reevaluation in 6 weeks.  If he continues to be symptomatic, he will benefit from surgical intervention with bilateral turbinate reduction.

## 2023-06-26 ENCOUNTER — Encounter: Payer: Self-pay | Admitting: Internal Medicine

## 2023-06-26 ENCOUNTER — Ambulatory Visit: Payer: PPO | Admitting: Internal Medicine

## 2023-06-26 VITALS — BP 100/70 | HR 88 | Temp 97.0°F | Resp 18

## 2023-06-26 DIAGNOSIS — K219 Gastro-esophageal reflux disease without esophagitis: Secondary | ICD-10-CM | POA: Diagnosis not present

## 2023-06-26 DIAGNOSIS — J3089 Other allergic rhinitis: Secondary | ICD-10-CM | POA: Diagnosis not present

## 2023-06-26 DIAGNOSIS — J4530 Mild persistent asthma, uncomplicated: Secondary | ICD-10-CM | POA: Diagnosis not present

## 2023-06-26 MED ORDER — EUCRISA 2 % EX OINT: 1.0000 | TOPICAL_OINTMENT | Freq: Two times a day (BID) | CUTANEOUS | 0 refills | Status: DC

## 2023-06-26 MED ORDER — PANTOPRAZOLE SODIUM 40 MG PO TBEC: 40.0000 mg | DELAYED_RELEASE_TABLET | Freq: Every day | ORAL | 1 refills | Status: DC

## 2023-06-26 MED ORDER — AZELASTINE HCL 0.1 % NA SOLN: 2.0000 | Freq: Two times a day (BID) | NASAL | 1 refills | Status: DC

## 2023-06-26 MED ORDER — LORATADINE 10 MG PO TABS: 10.0000 mg | ORAL_TABLET | Freq: Every day | ORAL | 1 refills | Status: DC | PRN

## 2023-06-26 MED ORDER — MONTELUKAST SODIUM 10 MG PO TABS: 10.0000 mg | ORAL_TABLET | Freq: Every day | ORAL | 2 refills | Status: DC

## 2023-06-26 MED ORDER — FLUTICASONE PROPIONATE 50 MCG/ACT NA SUSP: 2.0000 | Freq: Every day | NASAL | 5 refills | Status: DC

## 2023-06-26 MED ORDER — FLUTICASONE-SALMETEROL 100-50 MCG/ACT IN AEPB
1.0000 | INHALATION_SPRAY | Freq: Two times a day (BID) | RESPIRATORY_TRACT | 1 refills | Status: AC
Start: 2023-06-26 — End: ?

## 2023-06-26 NOTE — Patient Instructions (Addendum)
 Asthma-  Your breathing test looks good today With URI:  START: Advair 110-50 2 puffs twice a day for 2 weeks or until cough and wheeze free.   -rinse mouth out after use  Continue montelukast 10 mg once a day to prevent cough or wheeze  Continue albuterol 2 puffs every 4 hours as needed for cough or wheeze OR Instead use albuterol 0.083% solution via nebulizer one unit vial every 4 hours as needed for cough or wheeze   Allergic rhinitis-  Continue allergen avoidance measures directed toward grass pollen, weed pollen, tree pollen, and dust mite  Use saline nasal rinses or saline mist as needed for nasal symptoms. Use this before any medicated nasal sprays for best result Continue loratadine 10 mg once a day as needed  Continue Flonase nasal spray 2 sprays in each nostril once a day Continue azelastine 1 to 2 sprays in each nostril twice a day as needed   Reflux:  Continue dietary and lifestyle modifications Continue pantoprazole once a day Continue work-up with GI as scheduled.  Follow up : 6 months, sooner if needed It was a pleasure seeing you again in clinic today! Thank you for allowing me to participate in your care.  Tonny Bollman, MD [facility]

## 2023-06-26 NOTE — Progress Notes (Signed)
 FOLLOW UP Date of Service/Encounter:  06/26/23  Subjective:  Rachael Osborn (DOB: 05-02-1955) is a 68 y.o. female who [facility] in re-evaluation of the following: Asthma, allergic rhinitis, reflux History obtained from: chart review and patient.  For Review, LV was on 03/26/23  with Nehemiah Settle, FNP seen for routine follow-up. See below for summary of history and diagnostics.   Therapeutic plans/changes recommended: reported ongoing sinus symptoms without relief with doxycycline, treated with 14 days of bactrim and referred to ENT with sinus CT ----------------------------------------------------- Pertinent History/Diagnostics:  Chest x-ray 10/04/2021 normal clear lungs Spirometry (12/12/21): Ratio 83%, FEV1 2.01L 99%, FVC 2.43L, 93%  Hx of allergy testing: + grass pollen, weed pollen, tree pollen, and dust mite  She was referred to GI for GERD in June 2023  Hx sinus surgery in the late 1990s or early 2000's due to a deviated septum  Sinus CT 05/18/23: Focal mucosal thickening or small-volume fluid partially obstructs the left frontal sinus drainage pathway. Minimal rightward deviation of the bony nasal septum anterosuperiorly. Otherwise reassuring. ENT-06/13/23 Dr. Suszanne Conners, advised to continue Flonase and saline rinses.  Consider septoplasty --------------------------------------------------- Today presents for follow-up. Discussed the use of AI scribe software for clinical note transcription with the patient, who gave verbal consent to proceed.  History of Present Illness   Rachael Osborn is a 68 year old female with sinus issues and asthma who presents for evaluation of sinus pressure and breathing difficulties.  She experiences pressure behind her eyes, headaches, and nasal congestion. She had a sinus infection and visited the emergency room due to chills and body aches. She suspects she was on the verge of having the flu, but had  received a flu shot a few weeks prior, which she believes helped mitigate the symptoms. She has been experiencing sinus headaches and pressure, particularly with weather changes, but noted no issues the previous night.  Her breathing has improved, although she notices increased heart rate and difficulty when climbing stairs, which she attributes to weight gain. She uses Alvesco only during asthma flares and has not used albuterol this week, except before church services. She sometimes experiences breathing difficulties while singing and playing music at church, requiring her to use her inhaler.  She mentions ongoing reflux symptoms, stating 'not a whole lot of changes there.'  She is considering another root canal due to persistent sinus pressure and pain, and is looking for a nearby provider.  Her husband has a recurrence of prostate cancer, with a family history of the disease. His father died from prostate cancer, and several female relatives have been affected. Her husband's cancer is localized around the bladder and he is undergoing chemotherapy, with plans for radiation therapy.      All medications reviewed by clinical staff and updated in chart. No new pertinent medical or surgical history except as noted in HPI.  ROS: All others negative except as noted per HPI.   Objective:  BP 100/70   Pulse 88   Temp (!) 97 F (36.1 C) (Temporal)   Resp 18   SpO2 98%  There is no height or weight on file to calculate BMI. Physical Exam: General Appearance:  Alert, cooperative, no distress, appears stated age  Head:  Normocephalic, without obvious abnormality, atraumatic  Eyes:  Conjunctiva clear, EOM's intact  Ears EACs normal bilaterally and normal TMs bilaterally  Nose: Nares normal,  mildly deviated septum, normal mucosa, and no visible anterior polyps  Throat: Lips, tongue normal; teeth and gums normal, normal posterior oropharynx  Neck: Supple, symmetrical  Lungs:   clear to  auscultation bilaterally, Respirations unlabored, no coughing  Heart:  regular rate and rhythm and no murmur, Appears well perfused  Extremities: No edema  Skin: Skin color, texture, turgor normal and no rashes or lesions on visualized portions of skin  Neurologic: No gross deficits   Labs:  Lab Orders  No laboratory test(s) ordered today    Spirometry:  Tracings reviewed. Her effort: Good reproducible efforts. FVC: 2.33L FEV1: 1.98L, 99% predicted FEV1/FVC ratio: 0.85 Interpretation: Spirometry consistent with normal pattern.  Please see scanned spirometry results for details.  Assessment/Plan   Asthma-at goal Your breathing test looks good today With URI:  START: Advair 110-50 2 puffs twice a day for 2 weeks or until cough and wheeze free.   -rinse mouth out after use  Continue montelukast 10 mg once a day to prevent cough or wheeze  Continue albuterol 2 puffs every 4 hours as needed for cough or wheeze OR Instead use albuterol 0.083% solution via nebulizer one unit vial every 4 hours as needed for cough or wheeze   Allergic rhinitis- at goal Continue allergen avoidance measures directed toward grass pollen, weed pollen, tree pollen, and dust mite  Use saline nasal rinses or saline mist as needed for nasal symptoms. Use this before any medicated nasal sprays for best result Continue loratadine 10 mg once a day as needed  Continue Flonase nasal spray 2 sprays in each nostril once a day Continue azelastine 1 to 2 sprays in each nostril twice a day as needed   Reflux: stable Continue dietary and lifestyle modifications Continue pantoprazole once a day Continue work-up with GI as scheduled.  Follow up : 6 months, sooner if needed It was a pleasure seeing you again in clinic today! Thank you for allowing me to participate in your care.  Other: samples provided of: neilmed sinus rinses.  Tonny Bollman, MD  [facility]

## 2023-06-27 ENCOUNTER — Telehealth: Payer: Self-pay

## 2023-06-27 ENCOUNTER — Other Ambulatory Visit (HOSPITAL_COMMUNITY): Payer: Self-pay

## 2023-06-27 NOTE — Telephone Encounter (Signed)
 Pharmacy Patient Advocate Encounter   Received notification from CoverMyMeds that prior authorization for Eucrisa 2% ointment is required/requested.   Insurance verification completed.   The patient is insured through [facility] ADVANTAGE/RX ADVANCE .   Per test claim: PA required; PA submitted to above mentioned insurance via CoverMyMeds Key/confirmation #/EOC [id number] Status is pending

## 2023-07-02 ENCOUNTER — Other Ambulatory Visit: Payer: Self-pay

## 2023-07-02 MED ORDER — FLUTICASONE PROPIONATE 50 MCG/ACT NA SUSP: 2.0000 | Freq: Every day | NASAL | 1 refills | Status: DC

## 2023-07-03 MED ORDER — EUCRISA 2 % EX OINT: 1.0000 | TOPICAL_OINTMENT | Freq: Two times a day (BID) | CUTANEOUS | 0 refills | Status: AC

## 2023-07-03 NOTE — Telephone Encounter (Signed)
 Pharmacy Patient Advocate Encounter  Received notification from [facility] ADVANTAGE/RX ADVANCE that Prior Authorization for Eucrisa 2% ointment has been APPROVED from 06-27-2023 to 06-26-2024   PA #/Case ID/Reference #: [id number]

## 2023-07-03 NOTE — Telephone Encounter (Signed)
 Pharmacy notified of approval..[name]

## 2023-07-03 NOTE — Addendum Note (Signed)
 Addended by: Berna Bue on: 07/03/2023 04:13 PM   Modules accepted: Orders

## 2023-07-05 ENCOUNTER — Ambulatory Visit: Payer: PPO | Admitting: Physical Therapy

## 2023-07-11 ENCOUNTER — Ambulatory Visit

## 2023-07-27 ENCOUNTER — Ambulatory Visit (INDEPENDENT_AMBULATORY_CARE_PROVIDER_SITE_OTHER): Payer: PPO

## 2023-08-23 ENCOUNTER — Ambulatory Visit: Payer: PPO | Admitting: Internal Medicine

## 2023-09-18 ENCOUNTER — Encounter: Payer: Self-pay | Admitting: Hematology and Oncology

## 2023-11-15 ENCOUNTER — Other Ambulatory Visit: Payer: Self-pay | Admitting: *Deleted

## 2023-11-15 MED ORDER — ANASTROZOLE 1 MG PO TABS: 1.0000 mg | ORAL_TABLET | Freq: Every day | ORAL | 4 refills | Status: AC

## 2023-12-05 ENCOUNTER — Emergency Department (HOSPITAL_BASED_OUTPATIENT_CLINIC_OR_DEPARTMENT_OTHER)

## 2023-12-05 ENCOUNTER — Emergency Department (HOSPITAL_BASED_OUTPATIENT_CLINIC_OR_DEPARTMENT_OTHER)
Admission: EM | Admit: 2023-12-05 | Discharge: 2023-12-05 | Disposition: A | Discharge status: Home / Self Care | Attending: Emergency Medicine | Admitting: Emergency Medicine

## 2023-12-05 ENCOUNTER — Encounter (HOSPITAL_BASED_OUTPATIENT_CLINIC_OR_DEPARTMENT_OTHER): Payer: Self-pay

## 2023-12-05 ENCOUNTER — Other Ambulatory Visit: Payer: Self-pay

## 2023-12-05 DIAGNOSIS — Z7982 Long term (current) use of aspirin: Secondary | ICD-10-CM | POA: Insufficient documentation

## 2023-12-05 DIAGNOSIS — I10 Essential (primary) hypertension: Secondary | ICD-10-CM | POA: Diagnosis not present

## 2023-12-05 DIAGNOSIS — Z7951 Long term (current) use of inhaled steroids: Secondary | ICD-10-CM | POA: Insufficient documentation

## 2023-12-05 DIAGNOSIS — Z7952 Long term (current) use of systemic steroids: Secondary | ICD-10-CM | POA: Insufficient documentation

## 2023-12-05 DIAGNOSIS — R748 Abnormal levels of other serum enzymes: Secondary | ICD-10-CM | POA: Insufficient documentation

## 2023-12-05 DIAGNOSIS — M79651 Pain in right thigh: Secondary | ICD-10-CM | POA: Diagnosis present

## 2023-12-05 DIAGNOSIS — J45909 Unspecified asthma, uncomplicated: Secondary | ICD-10-CM | POA: Insufficient documentation

## 2023-12-05 DIAGNOSIS — R252 Cramp and spasm: Secondary | ICD-10-CM | POA: Diagnosis not present

## 2023-12-05 DIAGNOSIS — Z79899 Other long term (current) drug therapy: Secondary | ICD-10-CM | POA: Insufficient documentation

## 2023-12-05 LAB — CBC WITH DIFFERENTIAL/PLATELET
Abs Immature Granulocytes: 0.02 K/uL (ref 0.00–0.07)
Basophils Absolute: 0 K/uL (ref 0.0–0.1)
Basophils Relative: 1 %
Eosinophils Absolute: 0.1 K/uL (ref 0.0–0.5)
Eosinophils Relative: 2 %
HCT: 38.3 % (ref 36.0–46.0)
Hemoglobin: 12.6 g/dL (ref 12.0–15.0)
Immature Granulocytes: 0 %
Lymphocytes Relative: 31 %
Lymphs Abs: 1.6 K/uL (ref 0.7–4.0)
MCH: 27.8 pg (ref 26.0–34.0)
MCHC: 32.9 g/dL (ref 30.0–36.0)
MCV: 84.5 fL (ref 80.0–100.0)
Monocytes Absolute: 0.6 K/uL (ref 0.1–1.0)
Monocytes Relative: 11 %
Neutro Abs: 2.9 K/uL (ref 1.7–7.7)
Neutrophils Relative %: 55 %
Platelets: 278 K/uL (ref 150–400)
RBC: 4.53 MIL/uL (ref 3.87–5.11)
RDW: 14.6 % (ref 11.5–15.5)
WBC: 5.2 K/uL (ref 4.0–10.5)
nRBC: 0 % (ref 0.0–0.2)

## 2023-12-05 LAB — BASIC METABOLIC PANEL WITH GFR
Anion gap: 13 (ref 5–15)
BUN: 11 mg/dL (ref 8–23)
CO2: 28 mmol/L (ref 22–32)
Calcium: 10.2 mg/dL (ref 8.9–10.3)
Chloride: 101 mmol/L (ref 98–111)
Creatinine, Ser: 0.87 mg/dL (ref 0.44–1.00)
GFR, Estimated: >60 mL/min (ref >60)
Glucose, Bld: 86 mg/dL (ref 70–99)
Potassium: 3.7 mmol/L (ref 3.5–5.1)
Sodium: 141 mmol/L (ref 135–145)

## 2023-12-05 LAB — CK: Total CK: 371 U/L — ABNORMAL HIGH (ref 38–234)

## 2023-12-05 LAB — MAGNESIUM: Magnesium: 2.5 mg/dL — ABNORMAL HIGH (ref 1.7–2.4)

## 2023-12-05 MED ORDER — CYCLOBENZAPRINE HCL 10 MG PO TABS: 10.0000 mg | ORAL_TABLET | Freq: Two times a day (BID) | ORAL | 0 refills | Status: AC | PRN

## 2023-12-05 NOTE — Discharge Instructions (Addendum)
 Your symptoms are consistent with most likely muscle cramps and spasms.  Will discharge on a brief course of Flexeril , do not operate heavy machinery while taking that medication.  Your laboratory evaluation was otherwise unremarkable and your DVT ultrasound was negative for blood clot.

## 2023-12-05 NOTE — ED Provider Notes (Signed)
 [facility] Provider Note   CSN: [id number] Arrival date & time: 12/05/23  1210     Patient presents with: Leg Pain (right)   Rachael Osborn is a 68 y.o. female.    Leg Pain    68 year old female with medical history significant for HTN, asthma, GERD, anxiety, SVT, PVD who presents to the emergency department with a chief complaint of right thigh pain.  The patient states that she developed right thigh cramping that started overnight.  She states that the pain was sharp in her proximal thigh.  She has no history of DVT or PE.  She endorses improvement in the pain after it was massaged overnight.  She states that the pain is now 3 out of 10 in severity.  She denies any lower extremity swelling.  She presented due to concern for possible blood clot.  She denies any chest pain or shortness of breath.  Prior to Admission medications   Medication Sig Start Date End Date Taking? Authorizing Provider  cyclobenzaprine  (FLEXERIL ) 10 MG tablet Take 1 tablet (10 mg total) by mouth 2 (two) times daily as needed for muscle spasms. 12/05/23  Yes Jerrol Agent, MD  acetaminophen  (TYLENOL ) 500 MG tablet Take 1,000 mg by mouth every 6 (six) hours as needed for moderate pain.    [provider]  albuterol  (PROVENTIL ) (2.5 MG/3ML) 0.083% nebulizer solution Take 3 mLs (2.5 mg total) by nebulization every 6 (six) hours as needed for wheezing or shortness of breath. 06/13/21   Marinda Rocky SAILOR, MD  albuterol  (VENTOLIN  HFA) 108 (90 Base) MCG/ACT inhaler Inhale 2 puffs into the lungs every 4 (four) hours as needed for wheezing or shortness of breath. 05/09/22   Lorin Norris, MD  anastrozole  (ARIMIDEX ) 1 MG tablet Take 1 tablet (1 mg total) by mouth daily. 11/15/23   Iruku, Praveena, MD  aspirin  81 MG chewable tablet Chew 1 tablet by mouth daily.    [provider]  azelastine  (ASTELIN ) 0.1 % nasal spray Place 2 sprays into both nostrils 2 (two) times  daily. 06/26/23   Marinda Rocky SAILOR, MD  calcium -vitamin D (OSCAL WITH D) 500-200 MG-UNIT TABS tablet Take 1 tablet by mouth daily.    [provider]  Crisaborole  (EUCRISA ) 2 % OINT Apply 1 application  topically 2 (two) times daily. 07/03/23   Marinda Rocky SAILOR, MD  diclofenac Sodium (VOLTAREN) 1 % GEL APPLY 2 GRAMS TO AFFECTED AREAS BY TOPICAL ROUTE 4 TIMES DAILY 07/01/21   [provider]  diltiazem  (TIAZAC ) 180 MG 24 hr capsule TAKE 1 CAPSULE(180 MG) BY MOUTH DAILY 09/20/21   [provider]  diphenhydrAMINE  (BENADRYL ) 25 mg capsule Take 25 mg by mouth every 6 (six) hours as needed for allergies.    [provider]  docusate sodium  (COLACE) 100 MG capsule Take 1 capsule (100 mg total) by mouth 2 (two) times daily as needed for mild constipation. 05/20/21   Duwayne Purchase, MD  FEROSUL 325 (65 Fe) MG tablet Take 325 mg by mouth at bedtime. 09/21/21   [provider]  fluticasone  (FLONASE ) 50 MCG/ACT nasal spray Place 2 sprays into both nostrils daily. 07/02/23   Marinda Rocky SAILOR, MD  fluticasone -salmeterol (ADVAIR) 100-50 MCG/ACT AEPB Inhale 1 puff into the lungs 2 (two) times daily. 06/26/23   Marinda Rocky SAILOR, MD  gabapentin  (NEURONTIN ) 400 MG capsule Take 400 mg by mouth 4 (four) times daily. 01/02/20   [provider]  hydrochlorothiazide  (HYDRODIURIL ) 25  MG tablet Take 25 mg by mouth daily.     [provider]  HYDROcodone  bit-homatropine (HYDROMET) 5-1.5 MG/5ML syrup Take 5 mLs by mouth every 6 (six) hours as needed for cough. 04/28/23   Tegeler, Lonni PARAS, MD  HYDROcodone -acetaminophen  (HYCET) 7.5-325 mg/15 ml solution Take 10 mLs by mouth at bedtime. 04/26/23   Smoot, Lauraine LABOR, PA-C  loratadine  (CLARITIN ) 10 MG tablet Take 1 tablet (10 mg total) by mouth daily as needed for allergies. 06/26/23   Marinda Rocky SAILOR, MD  montelukast  (SINGULAIR ) 10 MG tablet Take 1 tablet (10 mg total) by mouth at bedtime. 06/26/23   Marinda Rocky SAILOR, MD  moxifloxacin   (VIGAMOX ) 0.5 % ophthalmic solution Place 1 drop into the left eye 4 times a day Start after surgery 10/18/21     moxifloxacin  (VIGAMOX ) 0.5 % ophthalmic solution Place 1 drop into the right eye 4 times a day start using once home from surgery 11/04/21     Multiple Vitamin (MULTIVITAMIN WITH MINERALS) TABS tablet Take 1 tablet by mouth daily.    [provider]  pantoprazole  (PROTONIX ) 40 MG tablet Take 1 tablet (40 mg total) by mouth daily. 06/26/23   Marinda Rocky SAILOR, MD  prednisoLONE  acetate (PRED FORTE ) 1 % ophthalmic suspension Place 1 drop into the left eye 4 times a day Start the day after surgery 10/18/21     rosuvastatin  (CRESTOR ) 20 MG tablet Take 20 mg by mouth at bedtime. 09/13/21   [provider]  sertraline  (ZOLOFT ) 25 MG tablet Take 1 tablet by mouth daily. 09/21/21   [provider]  valsartan  (DIOVAN ) 80 MG tablet Take 80 mg by mouth daily.    [provider]    Allergies: Robaxin [methocarbamol], Tizanidine , and Zyrtec [cetirizine]    Review of Systems  All other systems reviewed and are negative.   Updated Vital Signs BP 124/87 (BP Location: Left Arm)   Pulse 93   Temp 98 F (36.7 C) (Oral)   Resp 20   SpO2 100%   Physical Exam Vitals and nursing note reviewed.  Constitutional:      General: She is not in acute distress. HENT:     Head: Normocephalic and atraumatic.  Eyes:     Conjunctiva/sclera: Conjunctivae normal.     Pupils: Pupils are equal, round, and reactive to light.  Cardiovascular:     Rate and Rhythm: Normal rate and regular rhythm.  Pulmonary:     Effort: Pulmonary effort is normal. No respiratory distress.  Abdominal:     General: There is no distension.     Tenderness: There is no guarding.  Musculoskeletal:        General: Tenderness present. No deformity or signs of injury.     Cervical back: Neck supple.     Comments: Mild tenderness to palpation of the proximal right thigh, no palpable femoral hernia, no  inguinal hernia, no significant lower extremity swelling  Skin:    Findings: No lesion or rash.  Neurological:     General: No focal deficit present.     Mental Status: She is alert. Mental status is at baseline.     (all labs ordered are listed, but only abnormal results are displayed) Labs Reviewed  CK - Abnormal; Notable for the following components:      Result Value   Total CK 371 (*)    All other components within normal limits  MAGNESIUM - Abnormal; Notable for the following components:   Magnesium 2.5 (*)  All other components within normal limits  CBC WITH DIFFERENTIAL/PLATELET  BASIC METABOLIC PANEL WITH GFR    EKG: None  Radiology: US  Venous Img Lower Unilateral Right Result Date: 12/05/2023 CLINICAL DATA:  RIGHT thigh cramping since last night. EXAM: RIGHT LOWER EXTREMITY VENOUS DOPPLER ULTRASOUND TECHNIQUE: Gray-scale sonography with compression, as well as color and duplex ultrasound, were performed to evaluate the deep venous system(s) from the level of the common femoral vein through the popliteal and proximal calf veins. COMPARISON:  None available FINDINGS: VENOUS Normal compressibility of the common femoral, superficial femoral, and popliteal veins, as well as the visualized calf veins. Visualized portions of profunda femoral vein and great saphenous vein unremarkable. No filling defects to suggest DVT on grayscale or color Doppler imaging. Doppler waveforms show normal direction of venous flow, normal respiratory plasticity and response to augmentation. Limited views of the contralateral common femoral vein are unremarkable. OTHER None. Limitations: none IMPRESSION: No right lower extremity DVT. Electronically Signed   By: Aliene Lloyd M.D.   On: 12/05/2023 13:36     Procedures   Medications Ordered in the ED - No data to display                                  Medical Decision Making Amount and/or Complexity of Data Reviewed Labs:  ordered.  Risk Prescription drug management.    68 year old female with medical history significant for HTN, asthma, GERD, anxiety, SVT, PVD who presents to the emergency department with a chief complaint of right thigh pain.  The patient states that she developed right thigh cramping that started overnight.  She states that the pain was sharp in her proximal thigh.  She has no history of DVT or PE.  She endorses improvement in the pain after it was massaged overnight.  She states that the pain is now 3 out of 10 in severity.  She denies any lower extremity swelling.  She presented due to concern for possible blood clot.  She denies any chest pain or shortness of breath.  On arrival, the patient was afebrile, vitally stable.  Physical exam revealed tenderness to palpation of the proximal thigh on the right, no swelling noted.  Differential diagnose includes DVT, muscle spasm, electrolyte abnormality.  Patient denies any chest pain or shortness of breath, low concern for PE.  DVT ultrasound: No evidence of DVT in the right lower extremity  Labs: CBC without a leukocytosis or anemia, BMP unremarkable, magnesium 2.5, CK mildly elevated at 371.  Low concern for rhabdomyolysis, likely suspected elevated CK in the setting of mild muscle spasm.  Will treat with a short course of Flexeril , patient overall stable for discharge and outpatient follow-up.       Final diagnoses:  Muscle cramps  Elevated CK    ED Discharge Orders          Ordered    cyclobenzaprine  (FLEXERIL ) 10 MG tablet  2 times daily PRN        12/05/23 1417               Jerrol Agent, MD 12/05/23 1418

## 2023-12-05 NOTE — ED Triage Notes (Signed)
 Arrives POV with complaints of right thigh cramp that started overnight while she was walking. Patient states that the pain was sharp. She voices a concern about blood clots (no previous history).  Rates pain a 3/10.

## 2023-12-19 ENCOUNTER — Other Ambulatory Visit: Payer: Self-pay | Admitting: Internal Medicine

## 2023-12-27 ENCOUNTER — Ambulatory Visit: Payer: PPO | Admitting: Internal Medicine

## 2024-01-03 ENCOUNTER — Ambulatory Visit: Admitting: Internal Medicine

## 2024-01-09 ENCOUNTER — Ambulatory Visit (INDEPENDENT_AMBULATORY_CARE_PROVIDER_SITE_OTHER): Admitting: Internal Medicine

## 2024-01-09 ENCOUNTER — Other Ambulatory Visit: Payer: Self-pay

## 2024-01-09 ENCOUNTER — Encounter: Payer: Self-pay | Admitting: Internal Medicine

## 2024-01-09 VITALS — BP 128/70 | HR 94 | Temp 98.2°F | Resp 18 | Ht 66.0 in | Wt 188.9 lb

## 2024-01-09 DIAGNOSIS — J3089 Other allergic rhinitis: Secondary | ICD-10-CM | POA: Diagnosis not present

## 2024-01-09 DIAGNOSIS — J4530 Mild persistent asthma, uncomplicated: Secondary | ICD-10-CM

## 2024-01-09 DIAGNOSIS — K219 Gastro-esophageal reflux disease without esophagitis: Secondary | ICD-10-CM

## 2024-01-09 MED ORDER — PANTOPRAZOLE SODIUM 40 MG PO TBEC: 40.0000 mg | DELAYED_RELEASE_TABLET | Freq: Every day | ORAL | 2 refills | Status: AC

## 2024-01-09 MED ORDER — MONTELUKAST SODIUM 10 MG PO TABS: 10.0000 mg | ORAL_TABLET | Freq: Every day | ORAL | 2 refills | Status: AC

## 2024-01-09 MED ORDER — LORATADINE 10 MG PO TABS: 10.0000 mg | ORAL_TABLET | Freq: Every day | ORAL | 1 refills | Status: AC | PRN

## 2024-01-09 MED ORDER — FLUTICASONE PROPIONATE 50 MCG/ACT NA SUSP: 2.0000 | Freq: Every day | NASAL | 2 refills | Status: AC

## 2024-01-09 MED ORDER — AZELASTINE HCL 0.1 % NA SOLN: 2.0000 | Freq: Two times a day (BID) | NASAL | 1 refills | Status: AC

## 2024-01-09 MED ORDER — ALBUTEROL SULFATE HFA 108 (90 BASE) MCG/ACT IN AERS: 2.0000 | INHALATION_SPRAY | RESPIRATORY_TRACT | 2 refills | Status: AC | PRN

## 2024-01-09 NOTE — Patient Instructions (Addendum)
 Asthma-  Your breathing test looks great today With respiratory illness: START: Advair 110-50 2 puffs twice a day for 1-2 weeks or until cough and wheeze free.   -rinse mouth out after use  Continue montelukast  10 mg once a day to prevent cough or wheeze  Continue albuterol  2 puffs every 4 hours as needed for cough or wheeze OR Instead use albuterol  0.083% solution via nebulizer one unit vial every 4 hours as needed for cough or wheeze   Allergic rhinitis-  Continue allergen avoidance measures directed toward grass pollen, weed pollen, tree pollen, and dust mite  Use saline gel as needed for nasal symptoms. Use this before any medicated nasal sprays for best result Continue loratadine  10 mg once a day as needed  Continue Flonase  nasal spray 2 sprays in each nostril once a day Continue azelastine  1 to 2 sprays in each nostril twice a day as needed   Reflux:  Continue dietary and lifestyle modifications Continue pantoprazole  once a day Continue GI follow-up as scheduled  Follow up : 6 months, sooner if needed It was a pleasure seeing you again in clinic today! Thank you for allowing me to participate in your care.  Rocky Endow, MD [facility]

## 2024-01-09 NOTE — Progress Notes (Signed)
 FOLLOW UP Date of Service/Encounter:   01/09/2024  Subjective:  Rachael Osborn (DOB: [birth year]) is a 68 y.o. female who [facility]24 in re-evaluation of the following: Asthma, allergic rhinitis, reflux  History obtained from: chart review and patient.  For Review, LV was on 06/26/23  with Dr.Zoya Sprecher seen for routine follow-up. See below for summary of history and diagnostics.   Therapeutic plans/changes recommended: Fev1 99%, no changes made to medications ----------------------------------------------------- Pertinent History/Diagnostics:  Chest x-ray 10/04/2021 normal clear lungs Spirometry (12/12/21): Ratio 83%, FEV1 2.01L 99%, FVC 2.43L, 93%  Hx of allergy testing: + grass pollen, weed pollen, tree pollen, and dust mite  She was referred to GI for GERD in June 2023  Hx sinus surgery in the late 1990s or early 2000's due to a deviated septum  Sinus CT 05/18/23: Focal mucosal thickening or small-volume fluid partially obstructs the left frontal sinus drainage pathway. Minimal rightward deviation of the bony nasal septum anterosuperiorly. Otherwise reassuring. ENT-06/13/23 Dr. Karis, advised to continue Flonase  and saline rinses.  Consider septoplasty --------------------------------------------------- Today presents for follow-up. Discussed the use of AI scribe software for clinical note transcription with the patient, who gave verbal consent to proceed.  History of Present Illness Rachael Osborn is a 68 year old female with asthma and acid reflux who presents for a follow-up on her breathing and medication management.  Upper respiratory symptoms - Nasal stuffiness present throughout the day, worse at night - Nasal sprays provide effective symptom relief-using flonase  and azelastine  - Occasional mild epistaxis attributed to nasal dryness  Lower respiratory symptoms - Breathing remains stable - No recent use of rescue inhaler or  Advair - Continues montelukast  therapy - No recent need for antibiotics or steroids for respiratory or sinus infections  Gastroesophageal reflux symptoms - Acid reflux is manageable, with intermittent exacerbations - Continues pantoprazole  and dendrocalcin with vitamin D   All medications reviewed by clinical staff and updated in chart. No new pertinent medical or surgical history except as noted in HPI.  ROS: All others negative except as noted per HPI.   Objective:  BP 128/70   Pulse 94   Temp 98.2 F (36.8 C) (Temporal)   Resp 18   Ht 5' 6 (1.676 m)   Wt 188 lb 14.4 oz (85.7 kg)   SpO2 96%   BMI 30.49 kg/m  Body mass index is 30.49 kg/m. Physical Exam: General Appearance:  Alert, cooperative, no distress, appears stated age  Head:  Normocephalic, without obvious abnormality, atraumatic  Eyes:  Conjunctiva clear, EOM's intact  Ears EACs normal bilaterally and normal TMs bilaterally  Nose: Nares normal, hypertrophic turbinates, normal mucosa, and no visible anterior polyps  Throat: Lips, tongue normal; teeth and gums normal, normal posterior oropharynx  Neck: Supple, symmetrical  Lungs:   clear to auscultation bilaterally, Respirations unlabored, no coughing  Heart:  regular rate and rhythm and no murmur, Appears well perfused  Extremities: No edema  Skin: Skin color, texture, turgor normal and no rashes or lesions on visualized portions of skin  Neurologic: No gross deficits   Labs:  Lab Orders  No laboratory test(s) ordered today    Spirometry:  Tracings reviewed. Her effort: Good reproducible efforts. FVC: 2.31L FEV1: 2.17L, 105% predicted FEV1/FVC ratio: 0.94 Interpretation: Spirometry consistent with normal pattern.  Please see scanned spirometry results for details.   Assessment/Plan   Asthma- at goal Your breathing test looks great today With respiratory illness: START: Advair 110-50  2 puffs twice a day for 1-2 weeks or until cough and wheeze free.    -rinse mouth out after use  Continue montelukast  10 mg once a day to prevent cough or wheeze  Continue albuterol  2 puffs every 4 hours as needed for cough or wheeze OR Instead use albuterol  0.083% solution via nebulizer one unit vial every 4 hours as needed for cough or wheeze   Allergic rhinitis- at goal Continue allergen avoidance measures directed toward grass pollen, weed pollen, tree pollen, and dust mite  Use saline gel as needed for nasal symptoms. Use this before any medicated nasal sprays for best result Continue loratadine  10 mg once a day as needed  Continue Flonase  nasal spray 2 sprays in each nostril once a day Continue azelastine  1 to 2 sprays in each nostril twice a day as needed   Reflux: at goal Continue dietary and lifestyle modifications Continue pantoprazole  once a day Continue GI follow-up as scheduled  Follow up : 6 months, sooner if needed It was a pleasure seeing you again in clinic today! Thank you for allowing me to participate in your care.  Other: none  Rocky Endow, MD  [facility] 

## 2024-02-14 ENCOUNTER — Encounter: Payer: Self-pay | Admitting: Hematology and Oncology

## 2024-02-15 ENCOUNTER — Encounter: Payer: Self-pay | Admitting: Hematology and Oncology

## 2024-03-12 ENCOUNTER — Telehealth: Payer: Self-pay

## 2024-03-12 NOTE — Telephone Encounter (Signed)
 Spoke with patient and confirmed appointment on 11/13

## 2024-03-13 ENCOUNTER — Encounter: Payer: Self-pay | Admitting: Hematology and Oncology

## 2024-03-13 ENCOUNTER — Inpatient Hospital Stay: Payer: PPO | Attending: Hematology and Oncology | Admitting: Hematology and Oncology

## 2024-03-13 VITALS — BP 117/77 | HR 82 | Temp 98.2°F | Resp 16 | Wt 187.7 lb

## 2024-03-13 DIAGNOSIS — D0511 Intraductal carcinoma in situ of right breast: Secondary | ICD-10-CM
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
## 2024-07-09 ENCOUNTER — Ambulatory Visit: Admitting: Internal Medicine

## 2025-03-13 ENCOUNTER — Inpatient Hospital Stay: Admitting: Hematology and Oncology
